# Patient Record
Sex: Female | Born: 1950
Health system: Southern US, Community
[De-identification: ages and names within clinical notes are randomized; demographics above are authoritative.]

## PROBLEM LIST (undated history)

## (undated) DIAGNOSIS — M3500 Sicca syndrome, unspecified: Secondary | ICD-10-CM

## (undated) DIAGNOSIS — E039 Hypothyroidism, unspecified: Secondary | ICD-10-CM

## (undated) DIAGNOSIS — M539 Dorsopathy, unspecified: Secondary | ICD-10-CM

## (undated) DIAGNOSIS — Z8619 Personal history of other infectious and parasitic diseases: Secondary | ICD-10-CM

## (undated) DIAGNOSIS — G4700 Insomnia, unspecified: Secondary | ICD-10-CM

## (undated) DIAGNOSIS — E079 Disorder of thyroid, unspecified: Secondary | ICD-10-CM

## (undated) DIAGNOSIS — C801 Malignant (primary) neoplasm, unspecified: Secondary | ICD-10-CM

## (undated) DIAGNOSIS — K219 Gastro-esophageal reflux disease without esophagitis: Secondary | ICD-10-CM

## (undated) DIAGNOSIS — G971 Other reaction to spinal and lumbar puncture: Secondary | ICD-10-CM

## (undated) DIAGNOSIS — Z860100 Personal history of colon polyps, unspecified: Secondary | ICD-10-CM

## (undated) DIAGNOSIS — F329 Major depressive disorder, single episode, unspecified: Secondary | ICD-10-CM

## (undated) DIAGNOSIS — F32A Depression, unspecified: Secondary | ICD-10-CM

## (undated) DIAGNOSIS — T7840XA Allergy, unspecified, initial encounter: Secondary | ICD-10-CM

## (undated) DIAGNOSIS — J45909 Unspecified asthma, uncomplicated: Secondary | ICD-10-CM

## (undated) DIAGNOSIS — D649 Anemia, unspecified: Secondary | ICD-10-CM

## (undated) DIAGNOSIS — N3010 Interstitial cystitis (chronic) without hematuria: Secondary | ICD-10-CM

## (undated) DIAGNOSIS — M1990 Unspecified osteoarthritis, unspecified site: Secondary | ICD-10-CM

## (undated) DIAGNOSIS — E785 Hyperlipidemia, unspecified: Secondary | ICD-10-CM

## (undated) HISTORY — DX: Major depressive disorder, single episode, unspecified: F32.9

## (undated) HISTORY — DX: Hypothyroidism, unspecified: E03.9

## (undated) HISTORY — DX: Hyperlipidemia, unspecified: E78.5

## (undated) HISTORY — DX: Depression, unspecified: F32.A

## (undated) HISTORY — DX: Disorder of thyroid, unspecified: E07.9

## (undated) HISTORY — PX: JOINT REPLACEMENT: SHX530

## (undated) HISTORY — PX: TONSILLECTOMY AND ADENOIDECTOMY: SHX28

## (undated) HISTORY — DX: Sjogren syndrome, unspecified: M35.00

## (undated) HISTORY — DX: Unspecified asthma, uncomplicated: J45.909

## (undated) HISTORY — DX: Dorsopathy, unspecified: M53.9

## (undated) HISTORY — PX: COLONOSCOPY: SHX174

## (undated) HISTORY — DX: Allergy, unspecified, initial encounter: T78.40XA

## (undated) HISTORY — DX: Malignant (primary) neoplasm, unspecified: C80.1

---

## 1997-10-28 ENCOUNTER — Other Ambulatory Visit: Admission: RE | Admit: 1997-10-28 | Discharge: 1997-10-28 | Payer: Self-pay | Admitting: *Deleted

## 1998-11-18 ENCOUNTER — Other Ambulatory Visit: Admission: RE | Admit: 1998-11-18 | Discharge: 1998-11-18 | Payer: Self-pay | Admitting: *Deleted

## 1999-12-08 ENCOUNTER — Encounter: Admission: RE | Admit: 1999-12-08 | Discharge: 1999-12-08 | Payer: Self-pay | Admitting: *Deleted

## 1999-12-08 ENCOUNTER — Encounter: Payer: Self-pay | Admitting: *Deleted

## 1999-12-15 ENCOUNTER — Other Ambulatory Visit: Admission: RE | Admit: 1999-12-15 | Discharge: 1999-12-15 | Payer: Self-pay | Admitting: *Deleted

## 2001-01-09 ENCOUNTER — Other Ambulatory Visit: Admission: RE | Admit: 2001-01-09 | Discharge: 2001-01-09 | Payer: Self-pay | Admitting: *Deleted

## 2001-02-01 ENCOUNTER — Other Ambulatory Visit: Admission: RE | Admit: 2001-02-01 | Discharge: 2001-02-01 | Payer: Self-pay | Admitting: *Deleted

## 2001-02-01 ENCOUNTER — Encounter (INDEPENDENT_AMBULATORY_CARE_PROVIDER_SITE_OTHER): Payer: Self-pay

## 2001-02-22 ENCOUNTER — Encounter: Payer: Self-pay | Admitting: *Deleted

## 2001-02-22 ENCOUNTER — Encounter: Admission: RE | Admit: 2001-02-22 | Discharge: 2001-02-22 | Payer: Self-pay | Admitting: *Deleted

## 2002-02-24 ENCOUNTER — Encounter: Payer: Self-pay | Admitting: *Deleted

## 2002-02-24 ENCOUNTER — Encounter: Admission: RE | Admit: 2002-02-24 | Discharge: 2002-02-24 | Payer: Self-pay | Admitting: *Deleted

## 2002-02-27 ENCOUNTER — Other Ambulatory Visit: Admission: RE | Admit: 2002-02-27 | Discharge: 2002-02-27 | Payer: Self-pay | Admitting: *Deleted

## 2002-12-11 ENCOUNTER — Emergency Department (HOSPITAL_COMMUNITY): Admission: EM | Admit: 2002-12-11 | Discharge: 2002-12-12 | Payer: Self-pay | Admitting: *Deleted

## 2002-12-11 ENCOUNTER — Encounter: Payer: Self-pay | Admitting: *Deleted

## 2002-12-11 ENCOUNTER — Encounter: Payer: Self-pay | Admitting: Emergency Medicine

## 2003-02-18 ENCOUNTER — Other Ambulatory Visit: Admission: RE | Admit: 2003-02-18 | Discharge: 2003-02-18 | Payer: Self-pay | Admitting: *Deleted

## 2003-04-24 ENCOUNTER — Encounter: Admission: RE | Admit: 2003-04-24 | Discharge: 2003-04-24 | Payer: Self-pay | Admitting: *Deleted

## 2004-04-29 ENCOUNTER — Other Ambulatory Visit: Admission: RE | Admit: 2004-04-29 | Discharge: 2004-04-29 | Payer: Self-pay | Admitting: *Deleted

## 2004-06-10 ENCOUNTER — Encounter: Admission: RE | Admit: 2004-06-10 | Discharge: 2004-06-10 | Payer: Self-pay | Admitting: *Deleted

## 2005-01-13 ENCOUNTER — Other Ambulatory Visit: Admission: RE | Admit: 2005-01-13 | Discharge: 2005-01-13 | Payer: Self-pay | Admitting: *Deleted

## 2005-01-16 ENCOUNTER — Ambulatory Visit (HOSPITAL_COMMUNITY): Admission: RE | Admit: 2005-01-16 | Discharge: 2005-01-16 | Payer: Self-pay | Admitting: Family Medicine

## 2005-04-10 DIAGNOSIS — C801 Malignant (primary) neoplasm, unspecified: Secondary | ICD-10-CM

## 2005-04-10 HISTORY — DX: Malignant (primary) neoplasm, unspecified: C80.1

## 2005-08-08 ENCOUNTER — Encounter: Admission: RE | Admit: 2005-08-08 | Discharge: 2005-08-08 | Payer: Self-pay | Admitting: Obstetrics & Gynecology

## 2005-09-14 ENCOUNTER — Encounter: Admission: RE | Admit: 2005-09-14 | Discharge: 2005-09-14 | Payer: Self-pay | Admitting: Orthopedic Surgery

## 2006-02-09 ENCOUNTER — Other Ambulatory Visit: Admission: RE | Admit: 2006-02-09 | Discharge: 2006-02-09 | Payer: Self-pay | Admitting: Obstetrics & Gynecology

## 2006-09-19 ENCOUNTER — Encounter: Admission: RE | Admit: 2006-09-19 | Discharge: 2006-09-19 | Payer: Self-pay | Admitting: Obstetrics & Gynecology

## 2006-09-25 ENCOUNTER — Encounter: Admission: RE | Admit: 2006-09-25 | Discharge: 2006-09-25 | Payer: Self-pay | Admitting: Obstetrics & Gynecology

## 2007-05-12 HISTORY — PX: CARPAL TUNNEL RELEASE: SHX101

## 2007-07-18 ENCOUNTER — Other Ambulatory Visit: Admission: RE | Admit: 2007-07-18 | Discharge: 2007-07-18 | Payer: Self-pay | Admitting: Obstetrics & Gynecology

## 2007-12-19 ENCOUNTER — Encounter: Admission: RE | Admit: 2007-12-19 | Discharge: 2007-12-19 | Payer: Self-pay | Admitting: Obstetrics & Gynecology

## 2008-07-22 ENCOUNTER — Other Ambulatory Visit: Admission: RE | Admit: 2008-07-22 | Discharge: 2008-07-22 | Payer: Self-pay | Admitting: Obstetrics & Gynecology

## 2009-01-04 ENCOUNTER — Encounter: Admission: RE | Admit: 2009-01-04 | Discharge: 2009-01-04 | Payer: Self-pay | Admitting: Obstetrics & Gynecology

## 2010-01-31 ENCOUNTER — Encounter: Admission: RE | Admit: 2010-01-31 | Discharge: 2010-01-31 | Payer: Self-pay | Admitting: Obstetrics & Gynecology

## 2010-05-01 ENCOUNTER — Encounter: Payer: Self-pay | Admitting: Obstetrics & Gynecology

## 2010-05-01 ENCOUNTER — Encounter: Payer: Self-pay | Admitting: Family Medicine

## 2010-05-02 ENCOUNTER — Encounter: Payer: Self-pay | Admitting: Obstetrics & Gynecology

## 2011-04-28 ENCOUNTER — Ambulatory Visit (INDEPENDENT_AMBULATORY_CARE_PROVIDER_SITE_OTHER): Payer: Self-pay

## 2011-04-28 DIAGNOSIS — J180 Bronchopneumonia, unspecified organism: Secondary | ICD-10-CM

## 2011-08-03 ENCOUNTER — Other Ambulatory Visit: Payer: Self-pay | Admitting: Obstetrics & Gynecology

## 2011-08-03 DIAGNOSIS — Z1231 Encounter for screening mammogram for malignant neoplasm of breast: Secondary | ICD-10-CM

## 2011-08-07 ENCOUNTER — Ambulatory Visit
Admission: RE | Admit: 2011-08-07 | Discharge: 2011-08-07 | Disposition: A | Discharge status: Home / Self Care | Payer: Self-pay | Source: Ambulatory Visit | Attending: Obstetrics & Gynecology | Admitting: Obstetrics & Gynecology

## 2011-08-07 DIAGNOSIS — Z1231 Encounter for screening mammogram for malignant neoplasm of breast: Secondary | ICD-10-CM

## 2011-12-07 ENCOUNTER — Ambulatory Visit: Payer: Self-pay | Admitting: Emergency Medicine

## 2011-12-07 VITALS — BP 142/88 | HR 79 | Temp 97.9°F | Resp 16 | Ht 63.25 in | Wt 145.0 lb

## 2011-12-07 DIAGNOSIS — J40 Bronchitis, not specified as acute or chronic: Secondary | ICD-10-CM

## 2011-12-07 DIAGNOSIS — J018 Other acute sinusitis: Secondary | ICD-10-CM

## 2011-12-07 MED ORDER — PSEUDOEPHEDRINE-GUAIFENESIN ER 60-600 MG PO TB12: 1.0000 | ORAL_TABLET | Freq: Two times a day (BID) | ORAL | Status: AC

## 2011-12-07 MED ORDER — FLUCONAZOLE 150 MG PO TABS: ORAL_TABLET | ORAL | Status: DC

## 2011-12-07 MED ORDER — HYDROCOD POLST-CHLORPHEN POLST 10-8 MG/5ML PO LQCR: 5.0000 mL | Freq: Two times a day (BID) | ORAL | Status: DC | PRN

## 2011-12-07 MED ORDER — AMOXICILLIN-POT CLAVULANATE 875-125 MG PO TABS: 1.0000 | ORAL_TABLET | Freq: Two times a day (BID) | ORAL | Status: AC

## 2011-12-07 NOTE — Addendum Note (Signed)
Addended by: Cydney Ok on: 12/07/2011 07:43 PM   Modules accepted: Orders

## 2011-12-07 NOTE — Progress Notes (Signed)
   Date:  12/07/2011   Name:  Christine Valdez   DOB:  06/01/1950   MRN:  [id number] Gender: female Age: 61 y.o.  PCP:  Tally Due, MD    Chief Complaint: URI   History of Present Illness:  Christine Valdez is a 61 y.o. pleasant patient who presents with the following:  Ill three weeks with headache and a "head cold".  Initially was clear nasal discharge and a non productive cough.  Now having clear nasal drainage and purulent post nasal drainage and a purulent productive cough.  Feels hot and cold but no measured fever.  Has headache and sinus pressure.  No wheezing or shortness of breath, nausea or vomiting.  Malaise and myalgias.  Little improvement with OTC meds.    There is no problem list on file for this patient.   No past medical history on file.  No past surgical history on file.  History  Substance Use Topics  . Smoking status: Never Smoker   . Smokeless tobacco: Not on file  . Alcohol Use: Not on file    No family history on file.  Allergies  Allergen Reactions  . Mercury Detox (Nutritional Supplements)   . Sulfa Antibiotics   . Thimerosal Hives  . Levaquin (Levofloxacin In D5w) Rash    Medication list has been reviewed and updated.  Current Outpatient Prescriptions on File Prior to Visit  Medication Sig Dispense Refill  . albuterol (PROVENTIL HFA;VENTOLIN HFA) 108 (90 BASE) MCG/ACT inhaler Inhale 2 puffs into the lungs every 6 (six) hours as needed.      . citalopram (CELEXA) 40 MG tablet Take 40 mg by mouth daily.      Marland Kitchen levothyroxine (SYNTHROID, LEVOTHROID) 75 MCG tablet Take 75 mcg by mouth daily.      Marland Kitchen loratadine (CLARITIN) 10 MG tablet Take 10 mg by mouth daily.      . potassium chloride SA (K-DUR,KLOR-CON) 20 MEQ tablet Take 20 mEq by mouth daily.      Marland Kitchen triamterene-hydrochlorothiazide (MAXZIDE) 75-50 MG per tablet Take 1 tablet by mouth daily.        Review of Systems:  As per HPI, otherwise negative.    Physical  Examination: Filed Vitals:   12/07/11 1854  BP: 142/88  Pulse: 79  Temp: 97.9 F (36.6 C)  Resp: 16   Filed Vitals:   12/07/11 1854  Height: 5' 3.25" (1.607 m)  Weight: 145 lb (65.772 kg)   Body mass index is 25.48 kg/(m^2). Ideal Body Weight: Weight in (lb) to have BMI = 25: 142   GEN: WDWN, NAD, Non-toxic, A & O x 3 HEENT: Atraumatic, Normocephalic. Neck supple. No masses, No LAD.  TM negative.  Posterior pharyngeal purulent drainage Ears and Nose: No external deformity. CV: RRR, No M/G/R. No JVD. No thrill. No extra heart sounds. PULM: CTA B, no wheezes, crackles, rhonchi. No retractions. No resp. distress. No accessory muscle use. ABD: S, NT, ND, +BS. No rebound. No HSM. EXTR: No c/c/e NEURO Normal gait.   Assessment and Plan: Sinusitis Bronchitis  augmentin mucinex d tussionex Follow up as needed  Carmelina Dane, MD

## 2012-03-18 ENCOUNTER — Ambulatory Visit: Payer: Self-pay | Admitting: Emergency Medicine

## 2012-03-18 VITALS — BP 118/82 | HR 85 | Temp 98.1°F | Resp 16 | Ht 63.5 in | Wt 147.6 lb

## 2012-03-18 DIAGNOSIS — J018 Other acute sinusitis: Secondary | ICD-10-CM

## 2012-03-18 MED ORDER — PSEUDOEPHEDRINE-GUAIFENESIN ER 60-600 MG PO TB12: 1.0000 | ORAL_TABLET | Freq: Two times a day (BID) | ORAL | Status: AC

## 2012-03-18 MED ORDER — FLUTICASONE PROPIONATE 50 MCG/ACT NA SUSP: NASAL | Status: DC

## 2012-03-18 MED ORDER — FLUCONAZOLE 150 MG PO TABS: 150.0000 mg | ORAL_TABLET | Freq: Once | ORAL | Status: DC

## 2012-03-18 MED ORDER — AMOXICILLIN-POT CLAVULANATE 875-125 MG PO TABS: 1.0000 | ORAL_TABLET | Freq: Two times a day (BID) | ORAL | Status: DC

## 2012-03-18 NOTE — Progress Notes (Signed)
[facility] [address], [place] [address] [phone]  Date:  03/18/2012   Name:  Christine Valdez   DOB:  January 20, 1951   MRN:  [id number]  PCP:  Tally Due, MD    Chief Complaint: Sinusitis, Sore Throat, Fatigue and Headache   History of Present Illness:  Christine Valdez is a 61 y.o. very pleasant female patient who presents with the following:  1 week history of nasal congestion and pressure in frontal and maxillary sinuses associated with sore throat.  Moderate post nasal drainage associated with nausea.  No fever or chills, vomiting.  Minimal non productive cough.  No wheezing or shortness of breath.  No stool change.  There is no problem list on file for this patient.   Past Medical History  Diagnosis Date  . Allergy   . Asthma   . Cancer   . Depression     No past surgical history on file.  History  Substance Use Topics  . Smoking status: Never Smoker   . Smokeless tobacco: Not on file  . Alcohol Use: Not on file    Family History  Problem Relation Age of Onset  . Cancer Sister   . Diabetes Brother   . Epilepsy Daughter     Allergies  Allergen Reactions  . Mercury Detox (Nutritional Supplements)   . Sulfa Antibiotics   . Thimerosal Hives  . Levaquin (Levofloxacin In D5w) Rash    Medication list has been reviewed and updated.  Current Outpatient Prescriptions on File Prior to Visit  Medication Sig Dispense Refill  . albuterol (PROVENTIL HFA;VENTOLIN HFA) 108 (90 BASE) MCG/ACT inhaler Inhale 2 puffs into the lungs every 6 (six) hours as needed.      . citalopram (CELEXA) 40 MG tablet Take 40 mg by mouth daily.      . fish oil-omega-3 fatty acids 1000 MG capsule Take 2 g by mouth daily.      Marland Kitchen levothyroxine (SYNTHROID, LEVOTHROID) 75 MCG tablet Take 75 mcg by mouth daily.      Marland Kitchen loratadine (CLARITIN) 10 MG tablet Take 10 mg by mouth daily.      . Multiple Vitamin (MULTIVITAMIN) tablet Take 1 tablet by mouth daily.       . potassium chloride SA (K-DUR,KLOR-CON) 20 MEQ tablet Take 20 mEq by mouth daily.      Marland Kitchen triamterene-hydrochlorothiazide (MAXZIDE) 75-50 MG per tablet Take 1 tablet by mouth daily.      . chlorpheniramine-HYDROcodone (TUSSIONEX PENNKINETIC ER) 10-8 MG/5ML LQCR Take 5 mLs by mouth every 12 (twelve) hours as needed (cough).  60 mL  0  . fluconazole (DIFLUCAN) 150 MG tablet Take one tablet now, take another tablet in 7 days if symptoms still persist  2 tablet  0  . pseudoephedrine-guaifenesin (MUCINEX D) 60-600 MG per tablet Take 1 tablet by mouth every 12 (twelve) hours.  18 tablet  0    Review of Systems:  As per HPI, otherwise negative.    Physical Examination: Filed Vitals:   03/18/12 1218  BP: 118/82  Pulse: 85  Temp: 98.1 F (36.7 C)  Resp: 16   Filed Vitals:   03/18/12 1218  Height: 5' 3.5" (1.613 m)  Weight: 147 lb 9.6 oz (66.951 kg)   Body mass index is 25.74 kg/(m^2). Ideal Body Weight: Weight in (lb) to have BMI = 25: 143.1   GEN: WDWN, NAD, Non-toxic, A & O x 3  No rash or shortness of breath  HEENT: Atraumatic, Normocephalic. Neck supple. No masses, No LAD.  Oropharynx negative Ears and Nose: No external deformity.  TM negative  Green nasal drainage and tenderness over sinuses CV: RRR, No M/G/R. No JVD. No thrill. No extra heart sounds. PULM: CTA B, no wheezes, crackles, rhonchi. No retractions. No resp. distress. No accessory muscle use. ABD: S, NT, ND, +BS. No rebound. No HSM. EXTR: No c/c/e NEURO Normal gait.  PSYCH: Normally interactive. Conversant. Not depressed or anxious appearing.  Calm demeanor.    Assessment and Plan: Sinusitis augmentin mucinex d  Carmelina Dane, MD

## 2012-03-18 NOTE — Addendum Note (Signed)
Addended by: Carmelina Dane on: 03/18/2012 01:13 PM   Modules accepted: Orders

## 2012-04-15 LAB — HM PAP SMEAR: HM Pap smear: NEGATIVE

## 2012-07-19 ENCOUNTER — Other Ambulatory Visit: Payer: Self-pay

## 2012-07-19 DIAGNOSIS — Z1231 Encounter for screening mammogram for malignant neoplasm of breast: Secondary | ICD-10-CM

## 2012-08-14 ENCOUNTER — Other Ambulatory Visit: Payer: Self-pay | Admitting: Obstetrics & Gynecology

## 2012-08-14 NOTE — Telephone Encounter (Signed)
eScribe request for refill on TEMAZEPAM Last filled - 12/05/11, #30 X 5 Last AEX - 04/15/12 Next AEX - Not scheduled. Please advise refills.  Chart in your door.  Thanks.

## 2012-08-16 ENCOUNTER — Ambulatory Visit
Admission: RE | Admit: 2012-08-16 | Discharge: 2012-08-16 | Disposition: A | Discharge status: Home / Self Care | Payer: BC Managed Care – PPO | Source: Ambulatory Visit

## 2012-08-16 DIAGNOSIS — Z1231 Encounter for screening mammogram for malignant neoplasm of breast: Secondary | ICD-10-CM

## 2013-03-18 ENCOUNTER — Encounter: Payer: Self-pay | Admitting: Nurse Practitioner

## 2013-03-19 ENCOUNTER — Encounter: Payer: Self-pay | Admitting: Nurse Practitioner

## 2013-03-19 ENCOUNTER — Ambulatory Visit (INDEPENDENT_AMBULATORY_CARE_PROVIDER_SITE_OTHER): Payer: BC Managed Care – PPO | Admitting: Nurse Practitioner

## 2013-03-19 VITALS — BP 130/84 | HR 84 | Resp 16 | Ht 63.5 in | Wt 154.0 lb

## 2013-03-19 DIAGNOSIS — N951 Menopausal and female climacteric states: Secondary | ICD-10-CM

## 2013-03-19 DIAGNOSIS — Z01419 Encounter for gynecological examination (general) (routine) without abnormal findings: Secondary | ICD-10-CM

## 2013-03-19 DIAGNOSIS — Z780 Asymptomatic menopausal state: Secondary | ICD-10-CM

## 2013-03-19 DIAGNOSIS — Z0000 Encounter for general adult medical examination without abnormal findings: Secondary | ICD-10-CM

## 2013-03-19 LAB — POCT URINALYSIS DIPSTICK
Bilirubin, UA: NEGATIVE
Blood, UA: NEGATIVE
Glucose, UA: NEGATIVE
Ketones, UA: NEGATIVE
Leukocytes, UA: NEGATIVE
Nitrite, UA: NEGATIVE
Protein, UA: NEGATIVE
Urobilinogen, UA: NEGATIVE
pH, UA: 8

## 2013-03-19 NOTE — Progress Notes (Signed)
Patient ID: Christine Valdez, female   DOB: June 11, 1950, 62 y.o.   MRN: [id number] 62 y.o. G12P0102 Married Caucasian Fe here for annual exam.    No LMP recorded. Patient is postmenopausal.          Sexually active: no  The current method of family planning is abstinence.    Exercising: yes  occasional walking Smoker:  no  Health Maintenance: Pap: 04/15/12, WNL, neg HR HPV MMG: 08/20/12, Bi-Rads 1: negative Colonoscopy: 05/2012, Dr. Noe Gens, polyps pre cancer, repeat in 2 years BMD: 12/2007 normal TDaP: UTD Labs: HB: PCP Urine: Negative, pH 8.0   reports that she has never smoked. She has never used smokeless tobacco. She reports that she drinks alcohol. She reports that she does not use illicit drugs.  Past Medical History  Diagnosis Date  . Allergy   . Asthma   . Depression   . Hypertension   . Hypothyroid   . Sjogren's syndrome   . 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 Cesarean section      x 2  . Tonsillectomy and adenoidectomy  age 36  . Carpal tunnel release Right 05/2007    Current Outpatient Prescriptions  Medication Sig Dispense Refill  . albuterol (PROVENTIL HFA;VENTOLIN HFA) 108 (90 BASE) MCG/ACT inhaler Inhale 2 puffs into the lungs every 6 (six) hours as needed.      . citalopram (CELEXA) 40 MG tablet Take 40 mg by mouth daily.      Marland Kitchen docusate sodium (COLACE) 100 MG capsule Take 400 mg by mouth daily.      . DULoxetine (CYMBALTA) 30 MG capsule Take 30 mg by mouth daily.      . fish oil-omega-3 fatty acids 1000 MG capsule Take 2 g by mouth daily.      . fluticasone (FLONASE) 50 MCG/ACT nasal spray 2 squirts each nostril daily  16 g  12  . levothyroxine (SYNTHROID, LEVOTHROID) 75 MCG tablet Take 75 mcg by mouth daily.      Marland Kitchen loratadine (CLARITIN) 10 MG tablet Take 10 mg by mouth daily.      . Multiple Vitamin (MULTIVITAMIN) tablet Take 1 tablet by mouth daily.      . potassium chloride SA (K-DUR,KLOR-CON) 20  MEQ tablet Take 20 mEq by mouth daily.      . pregabalin (LYRICA) 50 MG capsule Take 50 mg by mouth 2 (two) times daily.      . temazepam (RESTORIL) 30 MG capsule Take 1 capsule (30 mg total) by mouth at bedtime as needed for sleep.  30 capsule  1  . triamterene-hydrochlorothiazide (MAXZIDE) 75-50 MG per tablet Take 1 tablet by mouth daily.       No current facility-administered medications for this visit.    Family History  Problem Relation Age of Onset  . Breast cancer Sister 36    stage 4, chemo and radiation  . Diabetes Brother   . Epilepsy Daughter   . Hypertension Mother   . Osteoporosis Mother   . Diabetes Father   . Hypertension Father     ROS:  Pertinent items are noted in HPI.  Otherwise, a comprehensive ROS was negative.  Exam:   BP 130/84  Pulse 84  Resp 16  Ht 5' 3.5" (1.613 m)  Wt 154 lb (69.854 kg)  BMI 26.85 kg/m2 Height: 5' 3.5" (161.3 cm)  Ht Readings from Last 3 Encounters:  03/19/13 5' 3.5" (  1.613 m)  03/18/12 5' 3.5" (1.613 m)  12/07/11 5' 3.25" (1.607 m)    General appearance: alert, cooperative and appears stated age Head: Normocephalic, without obvious abnormality, atraumatic Neck: no adenopathy, supple, symmetrical, trachea midline and thyroid normal to inspection and palpation Lungs: clear to auscultation bilaterally Breasts: normal appearance, no masses or tenderness Heart: regular rate and rhythm Abdomen: soft, non-tender; no masses,  no organomegaly Extremities: extremities normal, atraumatic, no cyanosis or edema Skin: Skin color, texture, turgor normal. No rashes or lesions Lymph nodes: Cervical, supraclavicular, and axillary nodes normal. No abnormal inguinal nodes palpated Neurologic: Grossly normal   Pelvic: External genitalia:  no lesions              Urethra:  normal appearing urethra with no masses, tenderness or lesions              Bartholin's and Skene's: normal                 Vagina: atrophic appearing vagina with pale  color and discharge, no lesions              Cervix: anteverted              Pap taken: no Bimanual Exam:  Uterus:  normal size, contour, position, consistency, mobility, non-tender              Adnexa: no mass, fullness, tenderness               Rectovaginal: Confirms               Anus:  normal sphincter tone, no lesions  A:  Well Woman with normal exam  History of HTN, hypothyroid  History of hypercalcemia  History of Sjogren's syndrome  History of depression and now chronic pain in cervical neck  FMH: sister with breast cancer    P:   Pap smear as per guidelines   Mammogram due 5/15  Information about BRCA testing  At length discussed that patient has multiple medical issues with chronic pain - we would prefer that her PCP manage all her med's.  We have given her an antidepressant years ago while going through menopausal but now Cymbalta and Lyrica are added.  Declined any refill of her med's as we will defer all medication therapy to PCP.  She was very comfortable with the plan.  Counseled on breast self exam, mammography screening, osteoporosis, adequate intake of calcium and vitamin D, diet and exercise return annually or prn  An After Visit Summary was printed and given to the patient.

## 2013-03-19 NOTE — Patient Instructions (Addendum)

## 2013-03-21 NOTE — Progress Notes (Signed)
Encounter reviewed by Dr. Brook Silva.  

## 2013-04-25 ENCOUNTER — Ambulatory Visit: Payer: Self-pay | Admitting: Obstetrics & Gynecology

## 2013-06-25 ENCOUNTER — Encounter (HOSPITAL_COMMUNITY): Payer: Self-pay | Admitting: Pharmacy Technician

## 2013-06-26 NOTE — H&P (Signed)
Christine Valdez is an 63 y.o. female.    Chief Complaint: right shoulder pain  HPI: Pt is a 63 y.o. female complaining of right shoulder pain for multiple years. Pain had continually increased since the beginning. X-rays in the clinic show end-stage arthritic changes of the right shoulder. Pt has tried various conservative treatments which have failed to alleviate their symptoms, including injections and therapy. Various options are discussed with the patient. Risks, benefits and expectations were discussed with the patient. Patient understand the risks, benefits and expectations and wishes to proceed with surgery.   PCP:  Hayden Rasmussen., MD  D/C Plans:  Home with HHPT  PMH: Past Medical History  Diagnosis Date  . Allergy   . Asthma   . Depression   . Hypertension   . Hypothyroid   . Sjogren's syndrome   . 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 Cesarean section      x 2  . Tonsillectomy and adenoidectomy  age 21  . Carpal tunnel release Right 05/2007    Social History:  reports that she has never smoked. She has never used smokeless tobacco. She reports that she drinks alcohol. She reports that she does not use illicit drugs.  Allergies:  Allergies  Allergen Reactions  . Thimerosal Hives  . Mercury Detox [Nutritional Supplements] Hives  . Levaquin [Levofloxacin] Rash  . Sulfa Antibiotics Rash    Medications: No current facility-administered medications for this encounter.   Current Outpatient Prescriptions  Medication Sig Dispense Refill  . albuterol (PROVENTIL HFA;VENTOLIN HFA) 108 (90 BASE) MCG/ACT inhaler Inhale 2 puffs into the lungs every 6 (six) hours as needed for shortness of breath.       . citalopram (CELEXA) 40 MG tablet Take 40 mg by mouth daily.      Marland Kitchen docusate sodium (COLACE) 100 MG capsule Take 400 mg by mouth at bedtime.       . fluticasone (FLONASE) 50 MCG/ACT nasal spray Place 2  sprays into both nostrils daily as needed for allergies.      . Ketotifen Fumarate (ALAWAY OP) Place 1 drop into both eyes daily as needed (for allergies).      Marland Kitchen levothyroxine (SYNTHROID, LEVOTHROID) 75 MCG tablet Take 75 mcg by mouth daily.      Marland Kitchen loratadine (CLARITIN) 10 MG tablet Take 10 mg by mouth daily as needed for allergies.       Marland Kitchen oxyCODONE-acetaminophen (PERCOCET) 10-325 MG per tablet Take 1 tablet by mouth 3 (three) times daily as needed for pain.      . potassium chloride SA (K-DUR,KLOR-CON) 20 MEQ tablet Take 20 mEq by mouth daily.      . temazepam (RESTORIL) 30 MG capsule Take 1 capsule (30 mg total) by mouth at bedtime as needed for sleep.  30 capsule  1  . triamterene-hydrochlorothiazide (MAXZIDE) 75-50 MG per tablet Take 0.5 tablets by mouth daily.       . cholecalciferol (VITAMIN D) 1000 UNITS tablet Take 1,000 Units by mouth daily.      . Multiple Vitamin (MULTIVITAMIN) tablet Take 1 tablet by mouth daily.        No results found for this or any previous visit (from the past 48 hour(s)). No results found.  ROS: Pain with rom of the right upper extremity  Physical Exam: Alert and oriented 63 y.o. female in no acute distress Cranial nerves 2-12 intact Cervical spine:  full rom with no tenderness, nv intact distally Chest: active breath sounds bilaterally, no wheeze rhonchi or rales Heart: regular rate and rhythm, no murmur Abd: non tender non distended with active bowel sounds Hip is stable with rom  Right shoulder with mild decrease with rom Mild crepitus with rom nv intact distally No rashes or edema Adequate strength of ER and IR 4.5/5  Assessment/Plan Assessment: right shoulder end stage osteoarthritis   Plan: Patient will undergo a right total shoulder arthroplasty by Dr. Veverly Fells at [facility]. Risks benefits and expectations were discussed with the patient. Patient understand risks, benefits and expectations and wishes to proceed.

## 2013-06-30 ENCOUNTER — Encounter (HOSPITAL_COMMUNITY): Payer: Self-pay

## 2013-06-30 ENCOUNTER — Encounter (HOSPITAL_COMMUNITY)
Admission: RE | Admit: 2013-06-30 | Discharge: 2013-06-30 | Disposition: A | Discharge status: Home / Self Care | Payer: BC Managed Care – PPO | Source: Ambulatory Visit | Attending: Orthopedic Surgery | Admitting: Orthopedic Surgery

## 2013-06-30 ENCOUNTER — Ambulatory Visit (HOSPITAL_COMMUNITY)
Admission: RE | Admit: 2013-06-30 | Discharge: 2013-06-30 | Disposition: A | Discharge status: Home / Self Care | Payer: BC Managed Care – PPO | Source: Ambulatory Visit | Attending: Anesthesiology | Admitting: Anesthesiology

## 2013-06-30 DIAGNOSIS — I10 Essential (primary) hypertension: Secondary | ICD-10-CM | POA: Insufficient documentation

## 2013-06-30 DIAGNOSIS — Z01818 Encounter for other preprocedural examination: Secondary | ICD-10-CM | POA: Insufficient documentation

## 2013-06-30 HISTORY — DX: Unspecified osteoarthritis, unspecified site: M19.90

## 2013-06-30 HISTORY — DX: Personal history of other infectious and parasitic diseases: Z86.19

## 2013-06-30 HISTORY — DX: Personal history of colon polyps, unspecified: Z86.0100

## 2013-06-30 HISTORY — DX: Anemia, unspecified: D64.9

## 2013-06-30 HISTORY — DX: Personal history of colonic polyps: Z86.010

## 2013-06-30 HISTORY — DX: Other reaction to spinal and lumbar puncture: G97.1

## 2013-06-30 HISTORY — DX: Interstitial cystitis (chronic) without hematuria: N30.10

## 2013-06-30 HISTORY — DX: Insomnia, unspecified: G47.00

## 2013-06-30 LAB — TYPE AND SCREEN
ABO/RH(D): A NEG
Antibody Screen: NEGATIVE

## 2013-06-30 LAB — ABO/RH: ABO/RH(D): A NEG

## 2013-06-30 LAB — APTT: aPTT: 27 seconds (ref 24–37)

## 2013-06-30 LAB — PROTIME-INR
INR: 0.91 (ref 0.00–1.49)
Prothrombin Time: 12.1 seconds (ref 11.6–15.2)

## 2013-06-30 MED ORDER — CHLORHEXIDINE GLUCONATE 4 % EX LIQD: 60.0000 mL | Freq: Once | CUTANEOUS | Status: DC

## 2013-06-30 NOTE — Progress Notes (Signed)
Pt doesn't have a cardiologist  Denies ever having an echo/stress test/heart cath  EKG in chart from [facility]  Denies CXR in past yr  Dr.Richter is Medical Md

## 2013-06-30 NOTE — Pre-Procedure Instructions (Signed)
JONATHAN KIRKENDOLL  06/30/2013   Your procedure is scheduled on:  Fri, Mar 27 @ 10:30 AM  Report to [facility]  at 8:30 AM.  Call this number if you have problems the morning of surgery: [phone]   Remember:   Do not eat food or drink liquids after midnight.   Take these medicines the morning of surgery with A SIP OF WATER: Albuterol<Bring Your Inhaler With You>,Celexa(Citalopram),Flonase(Fluticasone-if needed),Synthroid(Levothyroxine),Claritin(Loratadine-if needed),and Pain Pill(if needed)               No Goody's,BC's,Aleve,Ibuprofen,Fish Oil,or any Herbal Medications   Do not wear jewelry, make-up or nail polish.  Do not wear lotions, powders, or perfumes. You may wear deodorant.  Do not shave 48 hours prior to surgery.   Do not bring valuables to the hospital.  [facility] is not responsible                  for any belongings or valuables.               Contacts, dentures or bridgework may not be worn into surgery.  Leave suitcase in the car. After surgery it may be brought to your room.  For patients admitted to the hospital, discharge time is determined by your                treatment team.                 Special Instructions:  [facility] - Preparing for Surgery  Before surgery, you can play an important role.  Because skin is not sterile, your skin needs to be as free of germs as possible.  You can reduce the number of germs on you skin by washing with CHG (chlorahexidine gluconate) soap before surgery.  CHG is an antiseptic cleaner which kills germs and bonds with the skin to continue killing germs even after washing.  Please DO NOT use if you have an allergy to CHG or antibacterial soaps.  If your skin becomes reddened/irritated stop using the CHG and inform your nurse when you arrive at Short Stay.  Do not shave (including legs and underarms) for at least 48 hours prior to the first CHG shower.  You may shave your face.  Please follow these instructions  carefully:   1.  Shower with CHG Soap the night before surgery and the                                morning of Surgery.  2.  If you choose to wash your hair, wash your hair first as usual with your       normal shampoo.  3.  After you shampoo, rinse your hair and body thoroughly to remove the                      Shampoo.  4.  Use CHG as you would any other liquid soap.  You can apply chg directly       to the skin and wash gently with scrungie or a clean washcloth.  5.  Apply the CHG Soap to your body ONLY FROM THE NECK DOWN.        Do not use on open wounds or open sores.  Avoid contact with your eyes,       ears, mouth and genitals (private parts).  Wash genitals (private parts)  with your normal soap.  6.  Wash thoroughly, paying special attention to the area where your surgery        will be performed.  7.  Thoroughly rinse your body with warm water from the neck down.  8.  DO NOT shower/wash with your normal soap after using and rinsing off       the CHG Soap.  9.  Pat yourself dry with a clean towel.            10.  Wear clean pajamas.            11.  Place clean sheets on your bed the night of your first shower and do not        sleep with pets.  Day of Surgery  Do not apply any lotions/deoderants the morning of surgery.  Please wear clean clothes to the hospital/surgery center.     Please read over the following fact sheets that you were given: Pain Booklet, Coughing and Deep Breathing, Blood Transfusion Information and Surgical Site Infection Prevention

## 2013-07-02 NOTE — Progress Notes (Signed)
No labs at Dr Daine Floras office (and they are with [facility]) so CBC and Bmet will need to be drawn DOS

## 2013-07-03 MED ORDER — CEFAZOLIN SODIUM-DEXTROSE 2-3 GM-% IV SOLR
2.0000 g | INTRAVENOUS | Status: AC
  Administered 2013-07-04: 2 g via INTRAVENOUS
  Filled 2013-07-03: qty 50

## 2013-07-04 ENCOUNTER — Inpatient Hospital Stay (HOSPITAL_COMMUNITY)
Admission: RE | Admit: 2013-07-04 | Discharge: 2013-07-07 | DRG: 483 | Disposition: A | Discharge status: Home / Self Care | Payer: BC Managed Care – PPO | Source: Ambulatory Visit | Attending: Orthopedic Surgery | Admitting: Orthopedic Surgery

## 2013-07-04 ENCOUNTER — Encounter (HOSPITAL_COMMUNITY)
Admission: RE | Disposition: A | Discharge status: Home / Self Care | Payer: Self-pay | Source: Ambulatory Visit | Attending: Orthopedic Surgery

## 2013-07-04 ENCOUNTER — Encounter (HOSPITAL_COMMUNITY): Payer: Self-pay | Admitting: *Deleted

## 2013-07-04 ENCOUNTER — Encounter (HOSPITAL_COMMUNITY): Payer: BC Managed Care – PPO | Admitting: Vascular Surgery

## 2013-07-04 ENCOUNTER — Inpatient Hospital Stay (HOSPITAL_COMMUNITY): Payer: BC Managed Care – PPO | Admitting: Anesthesiology

## 2013-07-04 ENCOUNTER — Inpatient Hospital Stay (HOSPITAL_COMMUNITY): Payer: BC Managed Care – PPO

## 2013-07-04 DIAGNOSIS — Z860100 Personal history of colon polyps, unspecified: Secondary | ICD-10-CM

## 2013-07-04 DIAGNOSIS — G4700 Insomnia, unspecified: Secondary | ICD-10-CM | POA: Diagnosis present

## 2013-07-04 DIAGNOSIS — Z882 Allergy status to sulfonamides status: Secondary | ICD-10-CM

## 2013-07-04 DIAGNOSIS — E039 Hypothyroidism, unspecified: Secondary | ICD-10-CM | POA: Diagnosis present

## 2013-07-04 DIAGNOSIS — I10 Essential (primary) hypertension: Secondary | ICD-10-CM | POA: Diagnosis present

## 2013-07-04 DIAGNOSIS — M19019 Primary osteoarthritis, unspecified shoulder: Principal | ICD-10-CM | POA: Diagnosis present

## 2013-07-04 DIAGNOSIS — F3289 Other specified depressive episodes: Secondary | ICD-10-CM | POA: Diagnosis present

## 2013-07-04 DIAGNOSIS — F329 Major depressive disorder, single episode, unspecified: Secondary | ICD-10-CM | POA: Diagnosis present

## 2013-07-04 DIAGNOSIS — Z79899 Other long term (current) drug therapy: Secondary | ICD-10-CM

## 2013-07-04 DIAGNOSIS — Z888 Allergy status to other drugs, medicaments and biological substances status: Secondary | ICD-10-CM

## 2013-07-04 DIAGNOSIS — J45909 Unspecified asthma, uncomplicated: Secondary | ICD-10-CM | POA: Diagnosis present

## 2013-07-04 HISTORY — PX: TOTAL SHOULDER ARTHROPLASTY: SHX126

## 2013-07-04 SURGERY — ARTHROPLASTY, SHOULDER, TOTAL
Anesthesia: Regional | Site: Shoulder | Laterality: Right

## 2013-07-04 MED ORDER — PHENOL 1.4 % MT LIQD: 1.0000 | OROMUCOSAL | Status: DC | PRN

## 2013-07-04 MED ORDER — THROMBIN 5000 UNITS EX SOLR
CUTANEOUS | Status: DC | PRN
  Administered 2013-07-04: 5000 [IU] via TOPICAL

## 2013-07-04 MED ORDER — HYDROMORPHONE HCL PF 1 MG/ML IJ SOLN
1.0000 mg | INTRAMUSCULAR | Status: DC | PRN
Start: 2013-07-04 — End: 2013-07-07
  Administered 2013-07-04 – 2013-07-05 (×2): 2 mg via INTRAVENOUS
  Administered 2013-07-05 (×2): 1 mg via INTRAVENOUS
  Administered 2013-07-05 (×2): 2 mg via INTRAVENOUS
  Administered 2013-07-06 (×3): 1 mg via INTRAVENOUS
  Filled 2013-07-04: qty 1
  Filled 2013-07-04: qty 2
  Filled 2013-07-04: qty 1
  Filled 2013-07-04: qty 2
  Filled 2013-07-04: qty 1
  Filled 2013-07-04: qty 2
  Filled 2013-07-04: qty 1
  Filled 2013-07-04: qty 2
  Filled 2013-07-04: qty 1

## 2013-07-04 MED ORDER — ADULT MULTIVITAMIN W/MINERALS CH
1.0000 | ORAL_TABLET | Freq: Every day | ORAL | Status: DC
  Administered 2013-07-06 – 2013-07-07 (×2): 1 via ORAL
  Filled 2013-07-04 (×4): qty 1

## 2013-07-04 MED ORDER — PROPOFOL 10 MG/ML IV BOLUS
INTRAVENOUS | Status: DC | PRN
  Administered 2013-07-04: 150 mg via INTRAVENOUS

## 2013-07-04 MED ORDER — KETOTIFEN FUMARATE 0.025 % OP SOLN
1.0000 [drp] | Freq: Two times a day (BID) | OPHTHALMIC | Status: DC
  Administered 2013-07-04 – 2013-07-06 (×4): 1 [drp] via OPHTHALMIC
  Filled 2013-07-04: qty 5

## 2013-07-04 MED ORDER — METOCLOPRAMIDE HCL 10 MG PO TABS: 5.0000 mg | ORAL_TABLET | Freq: Three times a day (TID) | ORAL | Status: DC | PRN

## 2013-07-04 MED ORDER — LEVOTHYROXINE SODIUM 75 MCG PO TABS
75.0000 ug | ORAL_TABLET | Freq: Every day | ORAL | Status: DC
  Administered 2013-07-05 – 2013-07-07 (×3): 75 ug via ORAL
  Filled 2013-07-04 (×4): qty 1

## 2013-07-04 MED ORDER — TRIAMTERENE-HCTZ 37.5-25 MG PO TABS
1.0000 | ORAL_TABLET | Freq: Every day | ORAL | Status: DC
  Administered 2013-07-04 – 2013-07-07 (×3): 1 via ORAL
  Filled 2013-07-04 (×4): qty 1

## 2013-07-04 MED ORDER — HYDROMORPHONE HCL 2 MG PO TABS: 2.0000 mg | ORAL_TABLET | ORAL | Status: DC | PRN

## 2013-07-04 MED ORDER — LACTATED RINGERS IV SOLN
INTRAVENOUS | Status: DC
  Administered 2013-07-04: 09:00:00 via INTRAVENOUS

## 2013-07-04 MED ORDER — BISACODYL 10 MG RE SUPP: 10.0000 mg | Freq: Every day | RECTAL | Status: DC | PRN

## 2013-07-04 MED ORDER — ROCURONIUM BROMIDE 50 MG/5ML IV SOLN
INTRAVENOUS | Status: AC
Start: 2013-07-04 — End: 2013-07-04
  Filled 2013-07-04: qty 1

## 2013-07-04 MED ORDER — METHOCARBAMOL 500 MG PO TABS: 500.0000 mg | ORAL_TABLET | Freq: Three times a day (TID) | ORAL | Status: DC | PRN

## 2013-07-04 MED ORDER — ROCURONIUM BROMIDE 100 MG/10ML IV SOLN
INTRAVENOUS | Status: DC | PRN
  Administered 2013-07-04: 50 mg via INTRAVENOUS

## 2013-07-04 MED ORDER — DEXAMETHASONE SODIUM PHOSPHATE 4 MG/ML IJ SOLN
INTRAMUSCULAR | Status: DC | PRN
  Administered 2013-07-04: 8 mg via INTRAVENOUS

## 2013-07-04 MED ORDER — ACETAMINOPHEN 325 MG PO TABS: 650.0000 mg | ORAL_TABLET | Freq: Four times a day (QID) | ORAL | Status: DC | PRN

## 2013-07-04 MED ORDER — FENTANYL CITRATE 0.05 MG/ML IJ SOLN
50.0000 ug | INTRAMUSCULAR | Status: DC | PRN
  Administered 2013-07-04: 100 ug via INTRAVENOUS

## 2013-07-04 MED ORDER — OXYCODONE HCL 5 MG PO TABS: 5.0000 mg | ORAL_TABLET | Freq: Once | ORAL | Status: DC | PRN

## 2013-07-04 MED ORDER — METHOCARBAMOL 100 MG/ML IJ SOLN
500.0000 mg | Freq: Four times a day (QID) | INTRAVENOUS | Status: DC | PRN
  Filled 2013-07-04: qty 5

## 2013-07-04 MED ORDER — FENTANYL CITRATE 0.05 MG/ML IJ SOLN
INTRAMUSCULAR | Status: AC
Start: 2013-07-04 — End: 2013-07-04
  Filled 2013-07-04: qty 5

## 2013-07-04 MED ORDER — CITALOPRAM HYDROBROMIDE 40 MG PO TABS
40.0000 mg | ORAL_TABLET | Freq: Every day | ORAL | Status: DC
  Administered 2013-07-04 – 2013-07-06 (×3): 40 mg via ORAL
  Filled 2013-07-04 (×4): qty 1

## 2013-07-04 MED ORDER — BUPIVACAINE-EPINEPHRINE PF 0.5-1:200000 % IJ SOLN
INTRAMUSCULAR | Status: DC | PRN
  Administered 2013-07-04: 30 mL via PERINEURAL

## 2013-07-04 MED ORDER — SODIUM CHLORIDE 0.9 % IV SOLN
10.0000 mg | INTRAVENOUS | Status: DC | PRN
  Administered 2013-07-04: 10 ug/min via INTRAVENOUS

## 2013-07-04 MED ORDER — OXYCODONE-ACETAMINOPHEN 10-325 MG PO TABS: 1.0000 | ORAL_TABLET | ORAL | Status: DC | PRN

## 2013-07-04 MED ORDER — FENTANYL CITRATE 0.05 MG/ML IJ SOLN
INTRAMUSCULAR | Status: AC
  Administered 2013-07-04: 100 ug via INTRAVENOUS
  Filled 2013-07-04: qty 2

## 2013-07-04 MED ORDER — METOCLOPRAMIDE HCL 5 MG/ML IJ SOLN: 5.0000 mg | Freq: Three times a day (TID) | INTRAMUSCULAR | Status: DC | PRN

## 2013-07-04 MED ORDER — THROMBIN 5000 UNITS EX SOLR
CUTANEOUS | Status: AC
  Filled 2013-07-04: qty 5000

## 2013-07-04 MED ORDER — OXYCODONE-ACETAMINOPHEN 5-325 MG PO TABS
1.0000 | ORAL_TABLET | ORAL | Status: DC | PRN
  Administered 2013-07-05 – 2013-07-07 (×8): 1 via ORAL
  Filled 2013-07-04 (×9): qty 1

## 2013-07-04 MED ORDER — SODIUM CHLORIDE 0.9 % IV SOLN
INTRAVENOUS | Status: DC
  Administered 2013-07-05: 09:00:00 via INTRAVENOUS

## 2013-07-04 MED ORDER — POTASSIUM CHLORIDE CRYS ER 20 MEQ PO TBCR
20.0000 meq | EXTENDED_RELEASE_TABLET | Freq: Every day | ORAL | Status: DC
  Administered 2013-07-04 – 2013-07-06 (×2): 20 meq via ORAL
  Filled 2013-07-04 (×4): qty 1

## 2013-07-04 MED ORDER — GLYCOPYRROLATE 0.2 MG/ML IJ SOLN
INTRAMUSCULAR | Status: DC | PRN
  Administered 2013-07-04: 0.4 mg via INTRAVENOUS

## 2013-07-04 MED ORDER — MIDAZOLAM HCL 2 MG/2ML IJ SOLN
1.0000 mg | INTRAMUSCULAR | Status: DC | PRN
Start: 2013-07-04 — End: 2013-07-04
  Administered 2013-07-04: 2 mg via INTRAVENOUS

## 2013-07-04 MED ORDER — NEOSTIGMINE METHYLSULFATE 1 MG/ML IJ SOLN
INTRAMUSCULAR | Status: DC | PRN
  Administered 2013-07-04: 3 mg via INTRAVENOUS

## 2013-07-04 MED ORDER — OXYCODONE HCL 5 MG/5ML PO SOLN: 5.0000 mg | Freq: Once | ORAL | Status: DC | PRN

## 2013-07-04 MED ORDER — MENTHOL 3 MG MT LOZG
1.0000 | LOZENGE | OROMUCOSAL | Status: DC | PRN
  Administered 2013-07-05: 3 mg via ORAL
  Filled 2013-07-04: qty 9

## 2013-07-04 MED ORDER — OXYCODONE HCL 5 MG PO TABS
5.0000 mg | ORAL_TABLET | ORAL | Status: DC | PRN
  Administered 2013-07-05 – 2013-07-07 (×8): 5 mg via ORAL
  Filled 2013-07-04 (×9): qty 1

## 2013-07-04 MED ORDER — FLUTICASONE PROPIONATE 50 MCG/ACT NA SUSP
2.0000 | Freq: Every day | NASAL | Status: DC | PRN
  Filled 2013-07-04: qty 16

## 2013-07-04 MED ORDER — BUPIVACAINE-EPINEPHRINE (PF) 0.25% -1:200000 IJ SOLN
INTRAMUSCULAR | Status: AC
  Filled 2013-07-04: qty 30

## 2013-07-04 MED ORDER — DOCUSATE SODIUM 100 MG PO CAPS
400.0000 mg | ORAL_CAPSULE | Freq: Every day | ORAL | Status: DC
  Administered 2013-07-05 – 2013-07-06 (×2): 400 mg via ORAL
  Filled 2013-07-04 (×4): qty 4

## 2013-07-04 MED ORDER — PROPOFOL 10 MG/ML IV BOLUS
INTRAVENOUS | Status: AC
  Filled 2013-07-04: qty 20

## 2013-07-04 MED ORDER — ALBUTEROL SULFATE (2.5 MG/3ML) 0.083% IN NEBU
3.0000 mL | INHALATION_SOLUTION | Freq: Four times a day (QID) | RESPIRATORY_TRACT | Status: DC | PRN
  Administered 2013-07-05: 3 mL via RESPIRATORY_TRACT
  Filled 2013-07-04: qty 3

## 2013-07-04 MED ORDER — ONDANSETRON HCL 4 MG/2ML IJ SOLN
4.0000 mg | Freq: Four times a day (QID) | INTRAMUSCULAR | Status: DC | PRN
  Administered 2013-07-04 – 2013-07-05 (×3): 4 mg via INTRAVENOUS
  Filled 2013-07-04 (×3): qty 2

## 2013-07-04 MED ORDER — ONDANSETRON HCL 4 MG/2ML IJ SOLN
INTRAMUSCULAR | Status: DC | PRN
  Administered 2013-07-04 (×2): 4 mg via INTRAVENOUS

## 2013-07-04 MED ORDER — ONDANSETRON HCL 4 MG/2ML IJ SOLN
INTRAMUSCULAR | Status: AC
  Filled 2013-07-04: qty 2

## 2013-07-04 MED ORDER — TRIAMTERENE-HCTZ 75-50 MG PO TABS
0.5000 | ORAL_TABLET | Freq: Every day | ORAL | Status: DC
  Filled 2013-07-04: qty 0.5

## 2013-07-04 MED ORDER — BUPIVACAINE-EPINEPHRINE 0.25% -1:200000 IJ SOLN
INTRAMUSCULAR | Status: DC | PRN
  Administered 2013-07-04: 8 mL

## 2013-07-04 MED ORDER — FENTANYL CITRATE 0.05 MG/ML IJ SOLN
INTRAMUSCULAR | Status: DC | PRN
  Administered 2013-07-04: 75 ug via INTRAVENOUS
  Administered 2013-07-04: 50 ug via INTRAVENOUS

## 2013-07-04 MED ORDER — HEMOSTATIC AGENTS (NO CHARGE) OPTIME
TOPICAL | Status: DC | PRN
  Administered 2013-07-04: 1 via TOPICAL

## 2013-07-04 MED ORDER — ONDANSETRON HCL 4 MG PO TABS: 4.0000 mg | ORAL_TABLET | Freq: Four times a day (QID) | ORAL | Status: DC | PRN

## 2013-07-04 MED ORDER — METHOCARBAMOL 500 MG PO TABS
500.0000 mg | ORAL_TABLET | Freq: Four times a day (QID) | ORAL | Status: DC | PRN
  Administered 2013-07-05 – 2013-07-07 (×6): 500 mg via ORAL
  Filled 2013-07-04 (×6): qty 1

## 2013-07-04 MED ORDER — VITAMIN D3 25 MCG (1000 UNIT) PO TABS
1000.0000 [IU] | ORAL_TABLET | Freq: Every day | ORAL | Status: DC
  Administered 2013-07-04 – 2013-07-07 (×3): 1000 [IU] via ORAL
  Filled 2013-07-04 (×4): qty 1

## 2013-07-04 MED ORDER — LORATADINE 10 MG PO TABS
10.0000 mg | ORAL_TABLET | Freq: Every day | ORAL | Status: DC | PRN
  Filled 2013-07-04: qty 1

## 2013-07-04 MED ORDER — TEMAZEPAM 15 MG PO CAPS
30.0000 mg | ORAL_CAPSULE | Freq: Every evening | ORAL | Status: DC | PRN
Start: 2013-07-04 — End: 2013-07-07

## 2013-07-04 MED ORDER — DEXAMETHASONE SODIUM PHOSPHATE 4 MG/ML IJ SOLN
INTRAMUSCULAR | Status: AC
  Filled 2013-07-04: qty 2

## 2013-07-04 MED ORDER — HYDROMORPHONE HCL PF 1 MG/ML IJ SOLN: 0.2500 mg | INTRAMUSCULAR | Status: DC | PRN

## 2013-07-04 MED ORDER — LIDOCAINE HCL (CARDIAC) 20 MG/ML IV SOLN
INTRAVENOUS | Status: AC
  Filled 2013-07-04: qty 5

## 2013-07-04 MED ORDER — CEFAZOLIN SODIUM-DEXTROSE 2-3 GM-% IV SOLR
2.0000 g | Freq: Four times a day (QID) | INTRAVENOUS | Status: AC
  Administered 2013-07-04 – 2013-07-05 (×3): 2 g via INTRAVENOUS
  Filled 2013-07-04 (×3): qty 50

## 2013-07-04 MED ORDER — ONE-DAILY MULTI VITAMINS PO TABS: 1.0000 | ORAL_TABLET | Freq: Every day | ORAL | Status: DC

## 2013-07-04 MED ORDER — LIDOCAINE HCL (CARDIAC) 20 MG/ML IV SOLN
INTRAVENOUS | Status: DC | PRN
  Administered 2013-07-04: 80 mg via INTRAVENOUS

## 2013-07-04 MED ORDER — ACETAMINOPHEN 650 MG RE SUPP
650.0000 mg | Freq: Four times a day (QID) | RECTAL | Status: DC | PRN
Start: 2013-07-04 — End: 2013-07-07

## 2013-07-04 MED ORDER — LACTATED RINGERS IV SOLN
INTRAVENOUS | Status: DC | PRN
  Administered 2013-07-04: 09:00:00 via INTRAVENOUS

## 2013-07-04 MED ORDER — MIDAZOLAM HCL 2 MG/2ML IJ SOLN
INTRAMUSCULAR | Status: AC
  Administered 2013-07-04: 2 mg via INTRAVENOUS
  Filled 2013-07-04: qty 2

## 2013-07-04 MED ORDER — SODIUM CHLORIDE 0.9 % IR SOLN
Status: DC | PRN
  Administered 2013-07-04 (×2): 1000 mL

## 2013-07-04 SURGICAL SUPPLY — 84 items
BLADE SAW SAG 73X25 THK (BLADE) ×1
BLADE SAW SGTL 73X25 THK (BLADE) ×1 IMPLANT
BODY ANATOMIC PROXIMAL SZ10 (Shoulder) ×1 IMPLANT
BUR SURG 4X8 MED (BURR) IMPLANT
BURR SURG 4X8 MED (BURR)
CEMENT BONE DEPUY (Cement) ×2 IMPLANT
COVER SURGICAL LIGHT HANDLE (MISCELLANEOUS) ×2 IMPLANT
DRAPE INCISE IOBAN 66X45 STRL (DRAPES) ×5 IMPLANT
DRAPE U-SHAPE 47X51 STRL (DRAPES) ×2 IMPLANT
DRAPE X-RAY CASS 24X20 (DRAPES) IMPLANT
DRILL BIT 5/64 (BIT) ×2 IMPLANT
DRSG ADAPTIC 3X8 NADH LF (GAUZE/BANDAGES/DRESSINGS) ×2 IMPLANT
DRSG PAD ABDOMINAL 8X10 ST (GAUZE/BANDAGES/DRESSINGS) ×4 IMPLANT
DURAPREP 26ML APPLICATOR (WOUND CARE) ×2 IMPLANT
ELECT BLADE 4.0 EZ CLEAN MEGAD (MISCELLANEOUS) ×2
ELECT NDL TIP 2.8 STRL (NEEDLE) ×1 IMPLANT
ELECT NEEDLE TIP 2.8 STRL (NEEDLE) ×2 IMPLANT
ELECT REM PT RETURN 9FT ADLT (ELECTROSURGICAL) ×2
ELECTRODE BLDE 4.0 EZ CLN MEGD (MISCELLANEOUS) ×1 IMPLANT
ELECTRODE REM PT RTRN 9FT ADLT (ELECTROSURGICAL) ×1 IMPLANT
GLENOID ANCHOR PEG CROSSLK 44 (Orthopedic Implant) ×1 IMPLANT
GLOVE BIOGEL PI IND STRL 6.5 (GLOVE) IMPLANT
GLOVE BIOGEL PI IND STRL 8 (GLOVE) IMPLANT
GLOVE BIOGEL PI INDICATOR 6.5 (GLOVE) ×1
GLOVE BIOGEL PI INDICATOR 8 (GLOVE) ×1
GLOVE BIOGEL PI ORTHO PRO 7.5 (GLOVE)
GLOVE BIOGEL PI ORTHO PRO SZ7 (GLOVE) ×1
GLOVE BIOGEL PI ORTHO PRO SZ8 (GLOVE) ×1
GLOVE ORTHO TXT STRL SZ7.5 (GLOVE) ×1 IMPLANT
GLOVE PI ORTHO PRO STRL 7.5 (GLOVE) ×1 IMPLANT
GLOVE PI ORTHO PRO STRL SZ7 (GLOVE) IMPLANT
GLOVE PI ORTHO PRO STRL SZ8 (GLOVE) ×1 IMPLANT
GLOVE SURG ORTHO 8.5 STRL (GLOVE) ×4 IMPLANT
GLOVE SURG SS PI 7.0 STRL IVOR (GLOVE) ×2 IMPLANT
GLOVE SURG SS PI 8.0 STRL IVOR (GLOVE) ×1 IMPLANT
GOWN STRL REUS W/ TWL XL LVL3 (GOWN DISPOSABLE) ×3 IMPLANT
GOWN STRL REUS W/TWL 2XL LVL3 (GOWN DISPOSABLE) ×1 IMPLANT
GOWN STRL REUS W/TWL XL LVL3 (GOWN DISPOSABLE) ×4
HANDPIECE INTERPULSE COAX TIP (DISPOSABLE)
HEAD HUMERAL ECC 44X18MM SHLDR (Head) ×1 IMPLANT
KIT BASIN OR (CUSTOM PROCEDURE TRAY) ×2 IMPLANT
KIT ROOM TURNOVER OR (KITS) ×2 IMPLANT
MANIFOLD NEPTUNE II (INSTRUMENTS) ×2 IMPLANT
NDL 1/2 CIR MAYO (NEEDLE) ×1 IMPLANT
NDL 18GX1X1/2 (RX/OR ONLY) (NEEDLE) IMPLANT
NDL HYPO 25GX1X1/2 BEV (NEEDLE) ×1 IMPLANT
NDL SUT 6 .5 CRC .975X.05 MAYO (NEEDLE) ×1 IMPLANT
NEEDLE 1/2 CIR MAYO (NEEDLE) ×2 IMPLANT
NEEDLE 18GX1X1/2 (RX/OR ONLY) (NEEDLE) ×2 IMPLANT
NEEDLE HYPO 25GX1X1/2 BEV (NEEDLE) ×2 IMPLANT
NEEDLE MAYO TAPER (NEEDLE) ×2
NS IRRIG 1000ML POUR BTL (IV SOLUTION) ×3 IMPLANT
PACK SHOULDER (CUSTOM PROCEDURE TRAY) ×2 IMPLANT
PAD ARMBOARD 7.5X6 YLW CONV (MISCELLANEOUS) ×4 IMPLANT
PIN METAGLENE 2.5 (PIN) ×1 IMPLANT
SET HNDPC FAN SPRY TIP SCT (DISPOSABLE) IMPLANT
SLING ARM IMMOBILIZER LRG (SOFTGOODS) ×1 IMPLANT
SLING ARM IMMOBILIZER MED (SOFTGOODS) ×1 IMPLANT
SMARTMIX MINI TOWER (MISCELLANEOUS) ×2
SPONGE GAUZE 4X4 12PLY (GAUZE/BANDAGES/DRESSINGS) ×2 IMPLANT
SPONGE LAP 18X18 X RAY DECT (DISPOSABLE) ×2 IMPLANT
SPONGE LAP 4X18 X RAY DECT (DISPOSABLE) ×2 IMPLANT
SPONGE SURGIFOAM ABS GEL SZ50 (HEMOSTASIS) ×1 IMPLANT
STEM STANDARD SZ 10 113MM (Stem) ×2 IMPLANT
STEM STD SZ 10 113MM (Stem) IMPLANT
STRIP CLOSURE SKIN 1/2X4 (GAUZE/BANDAGES/DRESSINGS) ×2 IMPLANT
SUCTION FRAZIER TIP 10 FR DISP (SUCTIONS) ×2 IMPLANT
SUT FIBERWIRE #2 38 T-5 BLUE (SUTURE) ×10
SUT MNCRL AB 4-0 PS2 18 (SUTURE) ×2 IMPLANT
SUT PROLENE 6 0 C 1 24 (SUTURE) ×1 IMPLANT
SUT VIC AB 0 CT1 27 (SUTURE) ×2
SUT VIC AB 0 CT1 27XBRD ANBCTR (SUTURE) ×1 IMPLANT
SUT VIC AB 2-0 CT1 27 (SUTURE) ×4
SUT VIC AB 2-0 CT1 TAPERPNT 27 (SUTURE) ×1 IMPLANT
SUT VICRYL AB 2 0 TIES (SUTURE) ×2 IMPLANT
SUTURE FIBERWR #2 38 T-5 BLUE (SUTURE) ×2 IMPLANT
SYR 3ML LL SCALE MARK (SYRINGE) ×1 IMPLANT
SYR CONTROL 10ML LL (SYRINGE) ×2 IMPLANT
TOWEL OR 17X24 6PK STRL BLUE (TOWEL DISPOSABLE) ×2 IMPLANT
TOWEL OR 17X26 10 PK STRL BLUE (TOWEL DISPOSABLE) ×2 IMPLANT
TOWER SMARTMIX MINI (MISCELLANEOUS) ×1 IMPLANT
TRAY FOLEY CATH 16FRSI W/METER (SET/KITS/TRAYS/PACK) ×1 IMPLANT
WATER STERILE IRR 1000ML POUR (IV SOLUTION) ×2 IMPLANT
YANKAUER SUCT BULB TIP NO VENT (SUCTIONS) IMPLANT

## 2013-07-04 NOTE — Preoperative (Signed)
Beta Blockers   Reason not to administer Beta Blockers:Not Applicable 

## 2013-07-04 NOTE — Brief Op Note (Signed)
07/04/2013  12:52 PM  PATIENT:  Christine Valdez  63 y.o. female  PRE-OPERATIVE DIAGNOSIS:  RIGHT SHOULDER OA, END STAGED  POST-OPERATIVE DIAGNOSIS:  RIGHT SHOULDER OA, END STAGED  PROCEDURE:  Procedure(s): RIGHT TOTAL SHOULDER ARTHROPLASTY (Right)[employer]  SURGEON:  Surgeon(s) and Role:    * Augustin Schooling, MD - Primary  PHYSICIAN ASSISTANT:   ASSISTANTS: Ky Barban RNFA   ANESTHESIA:   regional and general  EBL:  Total I/O In: -  Out: 100 [Blood:100]  BLOOD ADMINISTERED:none  DRAINS: none   LOCAL MEDICATIONS USED:  MARCAINE     SPECIMEN:  No Specimen  DISPOSITION OF SPECIMEN:  N/A  COUNTS:  YES  TOURNIQUET:  * No tourniquets in log *  DICTATION: .Other Dictation: Dictation Number [id number]  PLAN OF CARE: Admit to inpatient   PATIENT DISPOSITION:  PACU - hemodynamically stable.   Delay start of Pharmacological VTE agent (>24hrs) due to surgical blood loss or risk of bleeding: not applicable

## 2013-07-04 NOTE — Anesthesia Postprocedure Evaluation (Signed)
Anesthesia Post Note  Patient: Christine Valdez  Procedure(s) Performed: Procedure(s) (LRB): RIGHT TOTAL SHOULDER ARTHROPLASTY (Right)  Anesthesia type: general  Patient location: PACU  Post pain: Pain level controlled  Post assessment: Patient's Cardiovascular Status Stable  Last Vitals:  Filed Vitals:   07/04/13 1420  BP:   Pulse: 79  Temp: 36.7 C  Resp: 14    Post vital signs: Reviewed and stable  Level of consciousness: sedated  Complications: No apparent anesthesia complications

## 2013-07-04 NOTE — Transfer of Care (Signed)
Immediate Anesthesia Transfer of Care Note  Patient: Christine Valdez  Procedure(s) Performed: Procedure(s): RIGHT TOTAL SHOULDER ARTHROPLASTY (Right)  Patient Location: PACU  Anesthesia Type:General  Level of Consciousness: awake, alert  and oriented  Airway & Oxygen Therapy: Patient Spontanous Breathing and Patient connected to nasal cannula oxygen  Post-op Assessment: Report given to PACU RN, Post -op Vital signs reviewed and stable and Patient moving all extremities X 4  Post vital signs: Reviewed and stable  Complications: No apparent anesthesia complications

## 2013-07-04 NOTE — Interval H&P Note (Signed)
History and Physical Interval Note:  07/04/2013 10:07 AM  Christine Valdez  has presented today for surgery, with the diagnosis of RIGHT SHOULDER OA  The various methods of treatment have been discussed with the patient and family. After consideration of risks, benefits and other options for treatment, the patient has consented to  Procedure(s): RIGHT TOTAL SHOULDER ARTHROPLASTY (Right) as a surgical intervention .  The patient's history has been reviewed, patient examined, no change in status, stable for surgery.  I have reviewed the patient's chart and labs.  Questions were answered to the patient's satisfaction.     Ryker Sudbury,STEVEN R

## 2013-07-04 NOTE — Discharge Instructions (Signed)
Ice to the right shoulder at all times,  Wear sling out of the home, in the house, ok to remove and hug a pillow. Keep incision clean and dry and covered for 5 days, then ok to get wet in the shower.  No heavy liftin pushing, pulling.  Do exercises every hour while awake, lap slides, door hinge exercises  Wrist and elbow ROM  Follow up in two weeks with Dr Veverly Fells  [phone]

## 2013-07-04 NOTE — Anesthesia Preprocedure Evaluation (Addendum)
Anesthesia Evaluation  Patient identified by MRN, date of birth, ID band Patient awake    Reviewed: Allergy & Precautions, H&P , NPO status , Patient's Chart, lab work & pertinent test results  Airway Mallampati: I TM Distance: >3 FB Neck ROM: Full    Dental no notable dental hx. (+) Teeth Intact, Dental Advisory Given   Pulmonary neg pulmonary ROS, asthma ,  breath sounds clear to auscultation  Pulmonary exam normal       Cardiovascular hypertension, On Medications Rhythm:Regular Rate:Normal     Neuro/Psych  Headaches, negative psych ROS   GI/Hepatic negative GI ROS, Neg liver ROS,   Endo/Other  Hypothyroidism   Renal/GU negative Renal ROS  negative genitourinary   Musculoskeletal   Abdominal   Peds  Hematology negative hematology ROS (+)   Anesthesia Other Findings   Reproductive/Obstetrics negative OB ROS                         Anesthesia Physical Anesthesia Plan  ASA: II  Anesthesia Plan: General and Regional   Post-op Pain Management:    Induction: Intravenous  Airway Management Planned: Oral ETT  Additional Equipment:   Intra-op Plan:   Post-operative Plan: Extubation in OR  Informed Consent: I have reviewed the patients History and Physical, chart, labs and discussed the procedure including the risks, benefits and alternatives for the proposed anesthesia with the patient or authorized representative who has indicated his/her understanding and acceptance.   Dental advisory given  Plan Discussed with: CRNA  Anesthesia Plan Comments:         Anesthesia Quick Evaluation

## 2013-07-04 NOTE — Progress Notes (Signed)
Utilization review completed.  

## 2013-07-04 NOTE — Anesthesia Procedure Notes (Addendum)
Anesthesia Regional Block:  Interscalene brachial plexus block  Pre-Anesthetic Checklist: ,, timeout performed, Correct Patient, Correct Site, Correct Laterality, Correct Procedure, Correct Position, site marked, Risks and benefits discussed, pre-op evaluation,  At surgeon's request and post-op pain management  Laterality: Right  Prep: Maximum Sterile Barrier Precautions used and chloraprep       Needles:  Injection technique: Single-shot  Needle Type: Echogenic Stimulator Needle     Needle Length: 5cm 5 cm Needle Gauge: 22 and 22 G    Additional Needles:  Procedures: ultrasound guided (picture in chart) and nerve stimulator Interscalene brachial plexus block  Nerve Stimulator or Paresthesia:  Response: Biceps response,   Additional Responses:   Narrative:  Start time: 07/04/2013 9:16 AM End time: 07/04/2013 9:25 AM Injection made incrementally with aspirations every 5 mL. Anesthesiologist: Ola Spurr, MD  Additional Notes: 2% Lidocaine skin wheel.    Procedure Name: Intubation Date/Time: 07/04/2013 10:25 AM Performed by: Kyung Rudd Pre-anesthesia Checklist: Patient identified, Emergency Drugs available, Suction available, Patient being monitored and Timeout performed Patient Re-evaluated:Patient Re-evaluated prior to inductionOxygen Delivery Method: Circle system utilized Preoxygenation: Pre-oxygenation with 100% oxygen Intubation Type: IV induction Ventilation: Mask ventilation without difficulty Laryngoscope Size: Mac and 3 Grade View: Grade I Tube type: Oral Tube size: 7.0 mm Number of attempts: 1 Airway Equipment and Method: Stylet Placement Confirmation: ETT inserted through vocal cords under direct vision,  positive ETCO2 and breath sounds checked- equal and bilateral Secured at: 21 cm Tube secured with: Tape Dental Injury: Teeth and Oropharynx as per pre-operative assessment

## 2013-07-05 ENCOUNTER — Inpatient Hospital Stay (HOSPITAL_COMMUNITY): Payer: BC Managed Care – PPO

## 2013-07-05 LAB — BASIC METABOLIC PANEL
BUN: 12 mg/dL (ref 6–23)
CO2: 25 meq/L (ref 19–32)
Calcium: 9.8 mg/dL (ref 8.4–10.5)
Chloride: 95 mEq/L — ABNORMAL LOW (ref 96–112)
Creatinine, Ser: 0.75 mg/dL (ref 0.50–1.10)
GFR calc Af Amer: >90 mL/min (ref >90)
GLUCOSE: 149 mg/dL — AB (ref 70–99)
Potassium: 4.4 mEq/L (ref 3.7–5.3)
Sodium: 134 mEq/L — ABNORMAL LOW (ref 137–147)

## 2013-07-05 LAB — HEMOGLOBIN AND HEMATOCRIT, BLOOD
HCT: 35.6 % — ABNORMAL LOW (ref 36.0–46.0)
Hemoglobin: 12.2 g/dL (ref 12.0–15.0)

## 2013-07-05 MED ORDER — ALBUTEROL SULFATE (2.5 MG/3ML) 0.083% IN NEBU
2.5000 mg | INHALATION_SOLUTION | Freq: Three times a day (TID) | RESPIRATORY_TRACT | Status: DC
  Administered 2013-07-06 – 2013-07-07 (×4): 2.5 mg via RESPIRATORY_TRACT
  Filled 2013-07-05 (×5): qty 3

## 2013-07-05 MED ORDER — ALBUTEROL SULFATE (2.5 MG/3ML) 0.083% IN NEBU: 3.0000 mL | INHALATION_SOLUTION | RESPIRATORY_TRACT | Status: DC | PRN

## 2013-07-05 MED ORDER — ALBUTEROL SULFATE (2.5 MG/3ML) 0.083% IN NEBU
2.5000 mg | INHALATION_SOLUTION | Freq: Four times a day (QID) | RESPIRATORY_TRACT | Status: DC
  Administered 2013-07-05: 2.5 mg via RESPIRATORY_TRACT
  Filled 2013-07-05: qty 3

## 2013-07-05 NOTE — Op Note (Signed)
NAMERUDY, DOMEK NO.:  [id number]  MEDICAL RECORD NO.:  [id number]  LOCATION:  5N31C                        FACILITY:  [facility]  PHYSICIAN:  Doran Heater. Veverly Fells, M.D. DATE OF BIRTH:  1950/04/15  DATE OF PROCEDURE:  07/04/2013 DATE OF DISCHARGE:                              OPERATIVE REPORT   PREOPERATIVE DIAGNOSIS:  Right shoulder end-stage osteoarthritis.  POSTOPERATIVE DIAGNOSIS:  Right shoulder end-stage osteoarthritis.  PROCEDURE PERFORMED:  Right total shoulder arthroplasty using DePuy Global Unite system.  ATTENDING SURGEON:  Doran Heater. Veverly Fells, MD.  ASSISTANT:  Ky Barban, RNFA.  General anesthesia plus interscalene block anesthesia was used.  ESTIMATED BLOOD LOSS:  Minimal.  FLUID REPLACED:  1200 mL crystalloid.  INSTRUMENT COUNTS:  Correct.  COMPLICATIONS:  There were no complications.  Perioperative antibiotics were given.  INDICATIONS:  The patient is a 63 year old female with worsening right shoulder pain secondary to end-stage arthritis.  The patient has had progressive pain despite conservative management.  Desires operative treatment to relieve pain and restore function of her shoulder and informed consent was obtained.  DESCRIPTION OF PROCEDURE:  After adequate level of anesthesia was achieved, the patient was positioned in the modified beach-chair position.  Right shoulder correctly identified and sterilely prepped and draped in usual manner.  After our time-out was called, we went ahead and entered the shoulder in standard deltopectoral incision.  We started at the coracoid process extending down to the anterior humerus. Dissection down through subcutaneous tissues using needle-tip Bovie. Identified the cephalic vein, took it laterally with the deltoid. Pectoralis taken medially.  Conjoined tendon identified and retracted medially.  We identified the biceps tendon, and then released the subscapularis tendon subperiosteally  tagging that with #2 FiberWire suture in a modified Mason-Allen suture technique for repair at the end of the surgery.  Released the inferior capsule off the neck of the humerus progressively externally rotated.  Large osteophytes were noted. There was no visible cartilage noted on the humerus.  We placed our T- handled Crego and had the patient's elbow at the patient's side with about 20 degrees of external rotation at the elbow.  We went ahead and resected using our humeral resection guide.  Once we had the humeral resection cut made done with the oscillating saw, we removed that head to the back table when used it for bone graft.  We then removed the remaining osteophytes using a rongeur, and then went ahead and prepared the remainder of the humerus.  We did the intramedullary reaming up to a size 10 and then went ahead and punched for the 10 Global Unite humerus. We impacted the trial stem in place to make sure it was fully seated. We then removed the trial and then subluxed the humerus posteriorly.  We did a 360-degree capsular removal and glenoid labrum removal.  We did not see significant cartilage remaining on the glenoid face either.  We protected the axillary nerve during this portion of procedure with adequate visualization placed our central guide pin and reamed up to the 44 glenoid and did our peripheral hand reaming.  We then drilled our central peg hole for the anchor peg glenoid prosthesis and then we did 3  peripheral holes using the drill hole guide.  We then placed our trial 44 trial anchor peg glenoid, and then were satisfied that was seated appropriately.  We then thoroughly irrigated and dried the peripheral holes and then cemented the component into place using DePuy 1 cement vacuum mixed and then injected into each of those 3 peripheral holes. We kept the pressure on that glenoid that was fully seated until the cement hardened on the table.  We then went ahead and  trialed with the 10 stem and a 44 x 18 eccentric head.  We had excellent soft tissue balancing and range of motion.  We removed the trial components, drilled holes centered on the biceps groove through the humerus placing #2 FiberWire sutures, so that we could repair the subscapularis anatomically.  We then irrigated again and then took the real size 10 Global Unite stem and using impaction grafting technique with available bone graft in the head, impacted the humerus in a press-fit fashion into place.  We then impacted 44 x 18 eccentric head giving best coverage for the proximal humerus.  We were pleased with the placement of the implant and then reduced the shoulder.  We were again happy with balancing.  We deflected posteriorly 50% of the head diameter and also inferiorly.  We then repaired the subscap anatomically using #2 FiberWire through bone and also rotator interval closure.  Then, we irrigated thoroughly and then closed the deltopectoral interval with 0 Vicryl suture followed by 2-0 Vicryl subcutaneous closure and 4-0 Monocryl for skin.  Steri-Strips applied followed by sterile dressing.  The patient tolerated the surgery well.     Doran Heater. Veverly Fells, M.D.     SRN/MEDQ  D:  07/04/2013  T:  07/05/2013  Job:  [id number]

## 2013-07-05 NOTE — Progress Notes (Signed)
Christine Valdez  MRN: [id number] DOB/Age: 1950/10/19 63 y.o. Physician: Ander Slade, M.D. 1 Day Post-Op Procedure(s) (LRB): RIGHT TOTAL SHOULDER ARTHROPLASTY (Right)  Subjective: Resting at bedside in chair, moderate pain earlier, medicated now with improved pain control. Vital Signs Temp:  [98 F (36.7 C)-99 F (37.2 C)] 98.4 F (36.9 C) (03/28 0528) Pulse Rate:  [72-95] 93 (03/28 0528) Resp:  [12-21] 16 (03/28 0528) BP: (103-134)/(59-90) 130/90 mmHg (03/28 0528) SpO2:  [88 %-96 %] 92 % (03/28 0528) Weight:  [70.761 kg (156 lb)] 70.761 kg (156 lb) (03/27 1515)  Lab Results  Recent Labs  07/05/13 0427  HGB 12.2  HCT 35.6*   BMET  Recent Labs  07/05/13 0427  NA 134*  K 4.4  CL 95*  CO2 25  GLUCOSE 149*  BUN 12  CREATININE 0.75  CALCIUM 9.8   INR  Date Value Ref Range Status  06/30/2013 0.91  0.00 - 1.49 Final     Exam  Dressings dry, N/V intact RUE, OT in room preparing to begin exercise session  Plan TSA protocol, With pain control issues will plan for d/c Sunday  Prather Failla M 07/05/2013, 11:11 AM

## 2013-07-05 NOTE — Progress Notes (Signed)
Occupational Therapy Treatment Patient Details Name: ANYI FELS MRN: [id number] DOB: [birth year] Today's Date: 07/05/2013    History of present illness Right total shoulder arthroplasty   OT comments  This 63 yo doing much better pain wise this PM (of note it was discovered between my AM and PM session that her sats were low so now on O2).  Follow Up Recommendations   (Per MD)    Equipment Recommendations  None recommended by OT       Precautions / Restrictions Precautions Precautions: Shoulder Shoulder Interventions: Shoulder sling/immobilizer;Off for dressing/bathing/exercises Required Braces or Orthoses: Sling Restrictions Weight Bearing Restrictions: Yes RUE Weight Bearing: Non weight bearing       Mobility Bed Mobility               General bed mobility comments: Pt up on BSC when I entered  Transfers Overall transfer level: Needs assistance   Transfers: Sit to/from Stand Sit to Stand: Min guard                                  Cognition   Behavior During Therapy: [facility] for tasks assessed/performed Overall Cognitive Status: Within Functional Limits for tasks assessed                         Exercises Pendulum exercises (written home exercise program): Moderate assistance          Pertinent Vitals/ Pain       No c/o         Frequency Min 3X/week     Progress Toward Goals  OT Goals(current goals can now be found in the care plan section)  Progress towards OT goals: Progressing toward goals     Plan Discharge plan remains appropriate    End of Session    Activity Tolerance Patient tolerated treatment well   Patient Left  (sitting EOB with NT)           Time: 5284-1324 OT Time Calculation (min): 16 min  Charges: OT General Charges $OT Visit: 1 Procedure OT Treatments $Therapeutic Exercise: 8-22 mins  Almon Register [phone] 07/05/2013, 5:34 PM

## 2013-07-05 NOTE — Evaluation (Signed)
Occupational Therapy Evaluation Patient Details Name: Christine Valdez MRN: [id number] DOB: [birth year] Today's Date: 07/05/2013    History of Present Illness Right total shoulder arthroplasty   Clinical Impression   This 63 yo female admitted and underwent above presents to acute OT with increased pain, increased nausea, decreased mobility due to both, decreased use of RUE all affecting pt's ability to take care of herself. Noted that orders on written for ADL's and sling; however this not Dr. Veverly Fells' typical protocol for an uncomplicated TSA so I telephone Jenetta Loges, [facility] for Dr. Onnie Graham (since they are coverage today). Spoke to [place] and she says that we should follow typical protocol and this what I did.    Follow Up Recommendations   (follow up per MD)    Equipment Recommendations  None recommended by OT       Precautions / Restrictions Precautions Precautions: Shoulder Shoulder Interventions: Shoulder sling/immobilizer;Off for dressing/bathing/exercises Required Braces or Orthoses: Sling Restrictions Weight Bearing Restrictions: Yes RUE Weight Bearing: Non weight bearing      Mobility Bed Mobility               General bed mobility comments: Pt up in recliner upon arrival  Transfers                 General transfer comment: Did not try this session due to pt with nausea with only arm exercises in supine         ADL                     General ADL Comments: Due to extreme pain and nausea, gave pt and husband handout and demonstrated to them about UBB/D as well as going over the other details on handout--will continue to educate               Pertinent Vitals/Pain 10/10 right shoulder with any activity; RN made aware and pt repositioned     Hand Dominance Right   Extremity/Trunk Assessment Upper Extremity Assessment Upper Extremity Assessment: RUE deficits/detail RUE Deficits / Details: Shoulder surgery this admission RUE  Coordination: decreased gross motor           Communication Communication Communication: No difficulties   Cognition Arousal/Alertness: Lethargic Behavior During Therapy: WFL for tasks assessed/performed Overall Cognitive Status: Within Functional Limits for tasks assessed                   Home Living Family/patient expects to be discharged to:: Private residence Living Arrangements: Spouse/significant other Available Help at Discharge: Family;Available 24 hours/day Type of Home: House Home Access: Stairs to enter [employer] of Steps: 3   Home Layout: One level     Bathroom Shower/Tub: Tub/shower unit;Curtain Shower/tub characteristics: Curtain       Home Equipment: None          Prior Functioning/Environment Level of Independence: Independent                OT Problem List: Decreased range of motion;Decreased activity tolerance;Impaired balance (sitting and/or standing);Impaired UE functional use;Decreased knowledge of precautions;Pain   OT Treatment/Interventions: Self-care/ADL training;DME and/or AE instruction;Balance training;Patient/family education;Therapeutic exercise    OT Goals(Current goals can be found in the care plan section) Acute Rehab OT Goals Patient Stated Goal: Home maybe tomorrow OT Goal Formulation: With patient Time For Goal Achievement: 07/12/13 Potential to Achieve Goals: Good  OT Frequency: Min 3X/week           End of Session:  Activity Tolerance: Patient limited by lethargy;Patient limited by pain (limited by nausea) Patient left: in chair;with call bell/phone within reach;with family/visitor present   Time: 1287-8676 OT Time Calculation (min): 37 min Charges:  OT General Charges $OT Visit: 1 Procedure OT Evaluation $Initial OT Evaluation Tier I: 1 Procedure OT Treatments $Self Care/Home Management : 8-22 mins $Therapeutic Exercise: 8-22 mins  Almon Register [phone] 07/05/2013, 12:10  PM

## 2013-07-06 NOTE — Progress Notes (Signed)
MD aware of patient's low oxygen saturation; patient unable to void in AM 3/29. Weaning patient to room air, patient keeping oxygen saturation in high 90's. Patient voiding on own; ambulatory with assist. Patient encouraged to use incentive spirometry. Patient's results of chest xray in chart. Will continue to monitor.

## 2013-07-06 NOTE — Progress Notes (Signed)
Occupational Therapy Treatment Patient Details Name: Christine Valdez MRN: [id number] DOB: 01/20/1951 Today's Date: 07/06/2013    History of present illness Right total shoulder arthroplasty   OT comments  Pt performed exercises and OT education on ADL information.   Follow Up Recommendations   (per MD)    Equipment Recommendations  None recommended by OT    Recommendations for Other Services      Precautions / Restrictions Precautions Precautions: Shoulder Type of Shoulder Precautions: [place] TSA protocol  Shoulder Interventions: Shoulder sling/immobilizer;Off for dressing/bathing/exercises Precaution Booklet Issued:  (shoulder handout in room) Required Braces or Orthoses: Sling Restrictions Weight Bearing Restrictions: Yes RUE Weight Bearing: Non weight bearing       Mobility Bed Mobility Overal bed mobility: Needs Assistance Bed Mobility: Supine to Sit;Sit to Supine     Supine to sit: Supervision Sit to supine: Supervision   General bed mobility comments: cues for precautions.  Transfers Overall transfer level: Needs assistance   Transfers: Sit to/from Stand Sit to Stand: Min guard              Balance                                   ADL       Upper Body Dressing : Sitting;Supervision/safety (spouse practiced donning/doffing sling)     Toilet Transfer: BSC;Min guard     Functional mobility during ADLs: Min guard General ADL Comments: Educated pt and spouse of information on shoulder handout. Spouse practiced donning/doffing sling and also bathing under pt's arm.      Vision                     Perception     Praxis      Cognition   Behavior During Therapy: [facility] for tasks assessed/performed Overall Cognitive Status: Within Functional Limits for tasks assessed                       Extremity/Trunk Assessment               Exercises Shoulder Exercises Pendulum Exercise:  Right;Standing Shoulder Flexion: AAROM;Right;10 reps;Supine Shoulder Extension: AAROM;Right;10 reps;Supine Shoulder External Rotation: AAROM;Right;10 reps;Supine Elbow Flexion: AROM;Right;20 reps;Standing Elbow Extension: AROM;Right;20 reps;Standing Wrist Flexion: AROM;Right;20 reps;Standing Wrist Extension: AROM;Right;20 reps;Standing Digit Composite Flexion: AROM;Right;20 reps;Standing Composite Extension: AROM;Right;Standing Donning/doffing shirt without moving shoulder:  (able to verbalize to therapist technique) Method for sponge bathing under operated UE: Minimal assistance Donning/doffing sling/immobilizer: Supervision/safety Correct positioning of sling/immobilizer: Caregiver independent with task Pendulum exercises (written home exercise program): Moderate assistance ROM for elbow, wrist and digits of operated UE: Supervision/safety Sling wearing schedule (on at all times/off for ADL's): Supervision/safety Proper positioning of operated UE when showering: Patient able to independently direct caregiver Positioning of UE while sleeping: Patient able to independently direct caregiver;Caregiver independent with task     General Comments      Pertinent Vitals/ Pain       Pain 8/10. Increased activity during session.   Home Living                                          Prior Functioning/Environment              Frequency Min 3X/week     Progress Toward  Goals  OT Goals(current goals can now be found in the care plan section)  Progress towards OT goals: Progressing toward goals  Acute Rehab OT Goals Patient Stated Goal: not stated OT Goal Formulation: With patient Time For Goal Achievement: 07/12/13 Potential to Achieve Goals: Good ADL Goals Pt Will Perform Upper Body Bathing: with set-up;with supervision;standing Pt Will Perform Upper Body Dressing: with min assist;sitting;standing (including sling) Pt/caregiver will Perform Home Exercise  Program: With written HEP provided;Right Upper extremity (per Dr. Veverly Fells' protocol for TSA)  Plan Discharge plan remains appropriate    End of Session Equipment Utilized During Treatment: Gait belt;Other (comment) (sling)  Activity Tolerance Patient tolerated treatment well   Patient Left in bed;with call bell/phone within reach;with family/visitor present   Nurse Communication  (redness under right arm)        Time: 0076-2263 OT Time Calculation (min): 51 min  Charges: OT General Charges $OT Visit: 1 Procedure OT Treatments $Self Care/Home Management : 8-22 mins $Therapeutic Exercise: 23-37 mins  Benito Mccreedy OTR/L [phone] 07/06/2013, 3:57 PM

## 2013-07-06 NOTE — Progress Notes (Signed)
Awake,crying c/o unable to void, bladder scanned 555 ml,in/out cath by [facility] for 500 cc clear yellow urine.po fluids enc. Sats drop to 80% ra,enc IS,TCDB 02 high 90's when wears 02 2l [place].Emotional c/o pain,prn given, was noted to be sleeping at 55mn rounds Called husband to come and stay with her for a while. Christine Valdez D

## 2013-07-06 NOTE — Progress Notes (Addendum)
   Subjective: 2 Days Post-Op Procedure(s) (LRB): RIGHT TOTAL SHOULDER ARTHROPLASTY (Right) Patient reports pain as mild.   Patient seen in rounds with Dr. Gladstone Lighter. Patient is doing well in regards to her shoulder. She has had some issues with poor O2 sats. She has been doing better since on oxygen. Has had some issues with voiding. Was trying to void while when we entered the room for rounds. She is anxious to go home.  Plan is to go Home after hospital stay.  Objective: Vital signs in last 24 hours: Temp:  [97.9 F (36.6 C)-98.5 F (36.9 C)] 98.5 F (36.9 C) (03/29 0625) Pulse Rate:  [85-100] 85 (03/29 0625) Resp:  [16-20] 20 (03/29 0625) BP: (122-138)/(65-77) 138/75 mmHg (03/29 0625) SpO2:  [93 %-98 %] 97 % (03/29 0625) FiO2 (%):  [32 %] 32 % (03/28 2133)  Intake/Output from previous day:  Intake/Output Summary (Last 24 hours) at 07/06/13 0819 Last data filed at 07/06/13 0200  Gross per 24 hour  Intake    240 ml  Output    500 ml  Net   -260 ml     Labs:  Recent Labs  07/05/13 0427  HGB 12.2    Recent Labs  07/05/13 0427  HCT 35.6*    Recent Labs  07/05/13 0427  NA 134*  K 4.4  CL 95*  CO2 25  BUN 12  CREATININE 0.75  GLUCOSE 149*  CALCIUM 9.8    EXAM General - Patient is Alert and Oriented Extremity - Neurologically intact Neurovascular intact No cellulitis present Dressing/Incision - clean, dry, no drainage Motor Function - intact, moving foot and toes well on exam.   Past Medical History  Diagnosis Date  . Allergy     uses Flonase daily as needed and takes Loratadine daily   . Hypothyroid   . Sjogren's syndrome   . 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 Insomnia     takes Restoril nightly as needed  . Depression     takes Celexa daily  . Hypertension     takes Maxzide daily   . Spinal headache     after 1st C/S  . Asthma     uses Albuterol daily as needed;exercise induced  . History of bronchitis     couple of months ago  was the last time  . Arthritis   . Joint pain   . Osteoarthritis   . Joint swelling   . History of colon polyps   . Interstitial cystitis   . Urinary urgency   . Anemia     only after birth of 2nd child  . History of shingles     Assessment/Plan: 2 Days Post-Op Procedure(s) (LRB): RIGHT TOTAL SHOULDER ARTHROPLASTY (Right) Active Problems:   Osteoarthrosis, unspecified whether generalized or localized, shoulder region  Estimated body mass index is 27.2 kg/(m^2) as calculated from the following:   Height as of this encounter: 5' 3.5" (1.613 m).   Weight as of this encounter: 70.761 kg (156 lb). Advance diet Up with therapy Plan for discharge tomorrow once O2 sat and voiding concerns are resolved.   Continue therapy. Start trying to wean patient off O2 to see how she responds. Patient is allergic to sulfa and therefore cannot have Flomax to help with voiding. Will discuss with pharmacy options if she continues to have voiding issues.   Iris Hairston LAUREN 07/06/2013, 8:19 AM

## 2013-07-07 NOTE — Progress Notes (Signed)
OT Cancellation and Discharge Note  Patient Details Name: Christine Valdez MRN: [id number] DOB: 1950/06/30   Cancelled Treatment:    Reason Eval/Treat Not Completed: Other (comment). Pt with no further OT concerns. Pt has handout instructions for R TSA exercises and pendulums. Pt's husband will be home to assist with ADLs and ROM as needed. Acute OT to sign off.   Juluis Rainier [phone] 07/07/2013, 3:21 PM

## 2013-07-07 NOTE — Progress Notes (Signed)
Patient provided with discharge instructions and follow up information. She is going home with assistance from her husband. Educated on importance of sling and ROM exercises daily. She has no further questions at this time and will be discharged to home shortly.

## 2013-07-07 NOTE — Progress Notes (Signed)
Orthopedic Tech Progress Note Patient Details:  Christine Valdez 06-13-1950 [id number] Ortho Devices Type of Ortho Device: Arm sling Ortho Device/Splint Location: RUE Ortho Device/Splint Interventions: Ordered  Replacement arm sling to go home with  Braulio Bosch 07/07/2013, 3:34 PM

## 2013-07-07 NOTE — Discharge Summary (Signed)
Physician Discharge Summary   Patient ID: Christine Valdez MRN: [id number] DOB/AGE: Jun 25, 1950 63 y.o.  Admit date: 07/04/2013 Discharge date: 07/07/2013  Admission Diagnoses:  Active Problems:   Osteoarthrosis, unspecified whether generalized or localized, shoulder region   Discharge Diagnoses:  Same   Surgeries: Procedure(s): RIGHT TOTAL SHOULDER ARTHROPLASTY on 07/04/2013   Consultants: PT/OT  Discharged Condition: Stable  Hospital Course: AMEIRAH KHATOON is an 63 y.o. female who was admitted 07/04/2013 with a chief complaint of No chief complaint on file. , and found to have a diagnosis of <principal problem not specified>.  They were brought to the operating room on 07/04/2013 and underwent the above named procedures.    The patient had an uncomplicated hospital course and was stable for discharge.  Recent vital signs:  Filed Vitals:   07/07/13 0709  BP: 128/73  Pulse: 87  Temp: 97.9 F (36.6 C)  Resp:     Recent laboratory studies:  Results for orders placed during the hospital encounter of 07/04/13  HEMOGLOBIN AND HEMATOCRIT, BLOOD      Result Value Ref Range   Hemoglobin 12.2  12.0 - 15.0 g/dL   HCT 35.6 (*) 36.0 - 10.9 %  BASIC METABOLIC PANEL      Result Value Ref Range   Sodium 134 (*) 137 - 147 mEq/L   Potassium 4.4  3.7 - 5.3 mEq/L   Chloride 95 (*) 96 - 112 mEq/L   CO2 25  19 - 32 mEq/L   Glucose, Bld 149 (*) 70 - 99 mg/dL   BUN 12  6 - 23 mg/dL   Creatinine, Ser 0.75  0.50 - 1.10 mg/dL   Calcium 9.8  8.4 - 10.5 mg/dL   GFR calc non Af Amer >90  >90 mL/min   GFR calc Af Amer >90  >90 mL/min    Discharge Medications:     Medication List         ALAWAY OP  Place 1 drop into both eyes daily as needed (for allergies).     albuterol 108 (90 BASE) MCG/ACT inhaler  Commonly known as:  PROVENTIL HFA;VENTOLIN HFA  Inhale 2 puffs into the lungs every 6 (six) hours as needed for shortness of breath.     cholecalciferol 1000 UNITS tablet    Commonly known as:  VITAMIN D  Take 1,000 Units by mouth daily.     citalopram 40 MG tablet  Commonly known as:  CELEXA  Take 40 mg by mouth daily.     docusate sodium 100 MG capsule  Commonly known as:  COLACE  Take 400 mg by mouth at bedtime.     fluticasone 50 MCG/ACT nasal spray  Commonly known as:  FLONASE  Place 2 sprays into both nostrils daily as needed for allergies.     HYDROmorphone 2 MG tablet  Commonly known as:  DILAUDID  Take 1 tablet (2 mg total) by mouth every 4 (four) hours as needed for severe pain.     levothyroxine 75 MCG tablet  Commonly known as:  SYNTHROID, LEVOTHROID  Take 75 mcg by mouth daily.     loratadine 10 MG tablet  Commonly known as:  CLARITIN  Take 10 mg by mouth daily as needed for allergies.     methocarbamol 500 MG tablet  Commonly known as:  ROBAXIN  Take 1 tablet (500 mg total) by mouth 3 (three) times daily as needed for muscle spasms.     multivitamin tablet  Take 1 tablet by mouth  daily.     oxyCODONE-acetaminophen 10-325 MG per tablet  Commonly known as:  PERCOCET  Take 1 tablet by mouth 3 (three) times daily as needed for pain.     potassium chloride SA 20 MEQ tablet  Commonly known as:  K-DUR,KLOR-CON  Take 20 mEq by mouth daily.     temazepam 30 MG capsule  Commonly known as:  RESTORIL  Take 1 capsule (30 mg total) by mouth at bedtime as needed for sleep.     triamterene-hydrochlorothiazide 75-50 MG per tablet  Commonly known as:  MAXZIDE  Take 0.5 tablets by mouth daily.        Diagnostic Studies: Dg Chest 1 View  07/05/2013   CLINICAL DATA:  Hypoxia, status post right shoulder arthroplasty  EXAM: CHEST - 1 VIEW  COMPARISON:  06/30/2013  FINDINGS: Low lung volumes with elevation of the right hemidiaphragm and suspected right basilar atelectasis. No pleural effusion or pneumothorax.  The heart is normal in size.  Right shoulder arthroplasty.  IMPRESSION: Low lung volumes with elevation of the right hemidiaphragm  and suspected right basilar atelectasis.  No evidence of acute cardiopulmonary disease.   Electronically Signed   By: Julian Hy M.D.   On: 07/05/2013 17:45   Dg Chest 2 View  06/30/2013   CLINICAL DATA:  Preoperative for right shoulder replacement; history of hypertension and bronchitis  EXAM: CHEST  2 VIEW  COMPARISON:  CHEST dated 12/19/2006  FINDINGS: The lungs are adequately inflated. There is no focal infiltrate. There is no pleural effusion or pneumothorax. The cardiac silhouette is normal in size. The pulmonary vascularity is not engorged. The mediastinum is normal in width. The trachea is midline. The thoracic vertebral bodies are preserved in height rib visualized. There are mild degenerative changes of both shoulders.  IMPRESSION: There is no evidence of active cardiopulmonary disease.   Electronically Signed   By: David  Martinique   On: 06/30/2013 16:44   Dg Shoulder Right Port  07/04/2013   CLINICAL DATA:  Total shoulder replacement  EXAM: PORTABLE RIGHT SHOULDER - 2+ VIEW  COMPARISON:  Chest 06/30/2013  FINDINGS: Total shoulder replacement on the right in satisfactory position and alignment. No fracture or acute complication.  IMPRESSION: Satisfactory right shoulder replacement  Right lower lobe atelectasis.   Electronically Signed   By: Franchot Gallo M.D.   On: 07/04/2013 14:25    Disposition: Final discharge disposition not confirmed       Future Appointments Provider Department Dept Phone   03/20/2014 2:30 PM [place], MD [facility] [phone]      Follow-up Information   Follow up with NORRIS,STEVEN R, MD. Call in 2 weeks. [phone])    Specialty:  Orthopedic Surgery   Contact information:   [address] [place] [address] [phone]        Signed: Ventura Bruns 07/07/2013, 12:35 PM

## 2013-07-07 NOTE — Progress Notes (Signed)
   Subjective: 3 Days Post-Op Procedure(s) (LRB): RIGHT TOTAL SHOULDER ARTHROPLASTY (Right)  Pt feeling much better  Less pain today and able to urinate better Ready for d/c home Patient reports pain as mild.  Objective:   VITALS:   Filed Vitals:   07/07/13 0709  BP: 128/73  Pulse: 87  Temp: 97.9 F (36.6 C)  Resp:     Right shoulder incision healing well nv intact distally No rashes or edema  LABS  Recent Labs  07/05/13 0427  HGB 12.2  HCT 35.6*     Recent Labs  07/05/13 0427  NA 134*  K 4.4  BUN 12  CREATININE 0.75  GLUCOSE 149*     Assessment/Plan: 3 Days Post-Op Procedure(s) (LRB): RIGHT TOTAL SHOULDER ARTHROPLASTY (Right) D/c home today F/u in 2 weeks      Merla Riches, MPAS, PA-C  07/07/2013, 12:34 PM

## 2013-07-08 ENCOUNTER — Encounter (HOSPITAL_COMMUNITY): Payer: Self-pay | Admitting: Orthopedic Surgery

## 2013-11-05 ENCOUNTER — Other Ambulatory Visit: Payer: Self-pay

## 2013-11-05 DIAGNOSIS — Z1231 Encounter for screening mammogram for malignant neoplasm of breast: Secondary | ICD-10-CM

## 2013-11-12 ENCOUNTER — Ambulatory Visit
Admission: RE | Admit: 2013-11-12 | Discharge: 2013-11-12 | Disposition: A | Discharge status: Home / Self Care | Payer: BC Managed Care – PPO | Source: Ambulatory Visit

## 2013-11-12 DIAGNOSIS — Z1231 Encounter for screening mammogram for malignant neoplasm of breast: Secondary | ICD-10-CM

## 2013-12-19 ENCOUNTER — Encounter: Payer: Self-pay | Admitting: Obstetrics and Gynecology

## 2014-01-23 ENCOUNTER — Other Ambulatory Visit: Payer: Self-pay

## 2014-02-09 ENCOUNTER — Encounter (HOSPITAL_COMMUNITY): Payer: Self-pay | Admitting: Orthopedic Surgery

## 2014-03-20 ENCOUNTER — Ambulatory Visit: Payer: Self-pay | Admitting: Obstetrics and Gynecology

## 2014-03-25 ENCOUNTER — Ambulatory Visit (INDEPENDENT_AMBULATORY_CARE_PROVIDER_SITE_OTHER): Payer: BC Managed Care – PPO | Admitting: Obstetrics and Gynecology

## 2014-03-25 ENCOUNTER — Encounter: Payer: Self-pay | Admitting: Obstetrics and Gynecology

## 2014-03-25 VITALS — BP 104/66 | HR 72 | Resp 18 | Ht 63.5 in | Wt 162.0 lb

## 2014-03-25 DIAGNOSIS — Z01419 Encounter for gynecological examination (general) (routine) without abnormal findings: Secondary | ICD-10-CM

## 2014-03-25 NOTE — Progress Notes (Signed)
Patient ID: Christine Valdez, female   DOB: 21-Jun-1950, 63 y.o.   MRN: [id number] 63 y.o. [id number] MarriedCaucasianF here for annual exam.   PCP:  Luetta Nutting, MD - [facility] in [facility] PCP does all prescriptions.  Had a total should replacement of the right side this year.  Now having problems with the left shoulder.   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  Has dermatologist.   Sister had breast cancer.   Patient's last menstrual period was 04/11/2003 (approximate).          Sexually active: No.  The current method of family planning is post menopausal status.    Exercising: No.  none. Smoker:  no  Health Maintenance: Pap:  04-15-12 wnl:neg HR HPV History of abnormal Pap:  no MMG:  11-12-13 heterogeneously dense/wnl:[facility] Colonoscopy:  05/2012 pre-cancerous polyps with Dr. Ferdinand Lango in [facility].   Repeat due 05/2014. BMD:   12/2007 wnl:[facility] TDaP:  Up to date with PCP Screening Labs:  Hb today:  PCP, Urine today: unable to void.   reports that she has never smoked. She has never used smokeless tobacco. She reports that she drinks alcohol. She reports that she does not use illicit drugs.  Past Medical History  Diagnosis Date  . Allergy     uses Flonase daily as needed and takes Loratadine daily   . Hypothyroid   . Sjogren's syndrome   . 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 Insomnia     takes Restoril nightly as needed  . Depression     takes Celexa daily  . Hypertension     takes Maxzide daily   . Spinal headache     after 1st C/S  . Asthma     uses Albuterol daily as needed;exercise induced  . History of bronchitis     couple of months ago was the last time  . Arthritis   . Joint pain   . Osteoarthritis   . Joint swelling   . History of colon polyps   . Interstitial cystitis   . Urinary urgency   . Anemia     only after birth of 2nd child  . History of shingles     Past Surgical History  Procedure Laterality Date  . Cesarean  section  1981/1984    x 2  . Tonsillectomy and adenoidectomy  age 11  . Carpal tunnel release Right 05/2007  . Colonoscopy    . Total shoulder arthroplasty Right 07/04/2013    DR NORRIS  . Total shoulder arthroplasty Right 07/04/2013    Procedure: RIGHT TOTAL SHOULDER ARTHROPLASTY;  Surgeon: Augustin Schooling, MD;  Location: [facility];  Service: Orthopedics;  Laterality: Right;    Current Outpatient Prescriptions  Medication Sig Dispense Refill  . albuterol (PROVENTIL HFA;VENTOLIN HFA) 108 (90 BASE) MCG/ACT inhaler Inhale 2 puffs into the lungs every 6 (six) hours as needed for shortness of breath.     . cholecalciferol (VITAMIN D) 1000 UNITS tablet Take 1,000 Units by mouth daily.    . citalopram (CELEXA) 40 MG tablet Take 40 mg by mouth daily.    Marland Kitchen docusate sodium (COLACE) 100 MG capsule Take 400 mg by mouth at bedtime.     . fluticasone (FLONASE) 50 MCG/ACT nasal spray Place 2 sprays into both nostrils daily as needed for allergies.    . fluticasone (FLONASE) 50 MCG/ACT nasal spray Place 2 sprays into the nose as needed.    Marland Kitchen  Ketotifen Fumarate (ALAWAY OP) Place 1 drop into both eyes daily as needed (for allergies).    Marland Kitchen levothyroxine (SYNTHROID, LEVOTHROID) 75 MCG tablet Take 75 mcg by mouth daily.    Marland Kitchen loratadine (CLARITIN) 10 MG tablet Take 10 mg by mouth daily as needed for allergies.     . Multiple Vitamin (MULTIVITAMIN) tablet Take 1 tablet by mouth daily.    . temazepam (RESTORIL) 30 MG capsule Take 1 capsule (30 mg total) by mouth at bedtime as needed for sleep. 30 capsule 1  . triamterene-hydrochlorothiazide (MAXZIDE) 75-50 MG per tablet Take 0.5 tablets by mouth daily.     . Omega-3 Fatty Acids (FISH OIL) 1000 MG CAPS Take 1 capsule by mouth daily.     No current facility-administered medications for this visit.    Family History  Problem Relation Age of Onset  . Breast cancer Sister 52    stage 4, chemo and radiation  . Diabetes Brother   . Epilepsy Daughter   . Hypertension  Mother   . Osteoporosis Mother   . Diabetes Father   . Hypertension Father     ROS:  Pertinent items are noted in HPI.  Otherwise, a comprehensive ROS was negative.  Exam:   BP 104/66 mmHg  Pulse 72  Resp 18  Ht 5' 3.5" (1.613 m)  Wt 162 lb (73.483 kg)  BMI 28.24 kg/m2  LMP 04/11/2003 (Approximate)      Height: 5' 3.5" (161.3 cm)  Ht Readings from Last 3 Encounters:  03/25/14 5' 3.5" (1.613 m)  07/04/13 5' 3.5" (1.613 m)  06/30/13 5' 3.5" (1.613 m)    General appearance: alert, cooperative and appears stated age Head: Normocephalic, without obvious abnormality, atraumatic Neck: no adenopathy, supple, symmetrical, trachea midline and thyroid normal to inspection and palpation Lungs: clear to auscultation bilaterally Breasts: normal appearance, no masses or tenderness, Inspection negative, No nipple retraction or dimpling, No nipple discharge or bleeding, No axillary or supraclavicular adenopathy Heart: regular rate and rhythm Abdomen: soft, non-tender; bowel sounds normal; no masses,  no organomegaly Extremities: extremities normal, atraumatic, no cyanosis or edema Skin: Skin color, texture, turgor normal. No rashes.  1.5 scaling red thickened skin of the right posterior shoulder. Lymph nodes: Cervical, supraclavicular, and axillary nodes normal. No abnormal inguinal nodes palpated Neurologic: Grossly normal   Pelvic: External genitalia:  no lesions              Urethra:  normal appearing urethra with no masses, tenderness or lesions              Bartholins and Skenes: normal                 Vagina: normal appearing vagina with normal color and discharge, no lesions              Cervix: no lesions              Pap taken: No. Bimanual Exam:  Uterus:  normal size, contour, position, consistency, mobility, non-tender              Adnexa: normal adnexa and no mass, fullness, tenderness               Rectovaginal: Confirms               Anus:  normal sphincter tone, no  lesions  Chaperone was present for exam.  A:  Well Woman with normal exam Skin lesion of right shoulder.   History of squamous cell  CA.  Family hx of breast CA.  P:   Mammogram yearly.  pap smear not indicated.  Patient will see her dermatologist about her shoulder.   return annually or prn

## 2014-03-25 NOTE — Patient Instructions (Signed)

## 2014-04-21 DIAGNOSIS — J4520 Mild intermittent asthma, uncomplicated: Secondary | ICD-10-CM | POA: Insufficient documentation

## 2014-06-29 ENCOUNTER — Telehealth: Payer: Self-pay | Admitting: Obstetrics and Gynecology

## 2014-06-29 NOTE — Telephone Encounter (Signed)
Pt would like name of a doctor for a colonoscopy procedure.

## 2014-06-29 NOTE — Telephone Encounter (Signed)
Spoke with patient. Patient states that she went for an appointment today to set up her colonoscopy in [facility]. "I am not sure that I want to have it done there after that appointment. I was hoping to get some other names of people I may be able to see in the [place] area." Provided patient with address and phone number for Dr.Mann at [facility] and Dr.Brodie at [facility]. Patient it agreeable. Will call to schedule an appointment if she would like to move providers.  Routing to provider for final review. Patient agreeable to disposition. Will close encounter

## 2014-09-25 ENCOUNTER — Encounter: Payer: Self-pay | Admitting: Obstetrics & Gynecology

## 2014-11-10 ENCOUNTER — Ambulatory Visit (INDEPENDENT_AMBULATORY_CARE_PROVIDER_SITE_OTHER): Payer: BLUE CROSS/BLUE SHIELD | Admitting: Emergency Medicine

## 2014-11-10 VITALS — BP 118/68 | HR 105 | Temp 99.1°F | Resp 18 | Ht 63.5 in | Wt 163.0 lb

## 2014-11-10 DIAGNOSIS — J020 Streptococcal pharyngitis: Secondary | ICD-10-CM

## 2014-11-10 DIAGNOSIS — J209 Acute bronchitis, unspecified: Secondary | ICD-10-CM | POA: Diagnosis not present

## 2014-11-10 MED ORDER — HYDROCOD POLST-CPM POLST ER 10-8 MG/5ML PO SUER: 5.0000 mL | Freq: Two times a day (BID) | ORAL | Status: DC

## 2014-11-10 MED ORDER — FLUCONAZOLE 150 MG PO TABS: 150.0000 mg | ORAL_TABLET | Freq: Once | ORAL | Status: DC

## 2014-11-10 MED ORDER — PENICILLIN V POTASSIUM 500 MG PO TABS: 500.0000 mg | ORAL_TABLET | Freq: Four times a day (QID) | ORAL | Status: DC

## 2014-11-10 NOTE — Patient Instructions (Signed)

## 2014-11-10 NOTE — Progress Notes (Signed)
Subjective:  Patient ID: Christine Valdez, female    DOB: 01-Apr-1951  Age: 64 y.o. MRN: [id number]  CC: Sore Throat; Laryngitis; and Shortness of Breath   HPI Christine Valdez presents  with sore throat. She took care of her granddaughter who was just diagnosed with strep and hand-foot-and-mouth disease. She has marked sore throat or hoarseness associated with some cough. She has no fever chills nausea vomiting. She has fatigue and and feels "miserable" no nausea vomiting or stool change no rash. She has no intraoral lesions. Said no improvement with over-the-counter medication  History Christine Valdez has a past medical history of Allergy; Hypothyroid; Sjogren's syndrome; Cancer (2007); Insomnia; Depression; Hypertension; Spinal headache; Asthma; History of bronchitis; Arthritis; Joint pain; Osteoarthritis; Joint swelling; History of colon polyps; Interstitial cystitis; Urinary urgency; Anemia; and History of shingles.   She has past surgical history that includes Cesarean section (1981/1984); Tonsillectomy and adenoidectomy (age 77); Carpal tunnel release (Right, 05/2007); Colonoscopy; Total shoulder arthroplasty (Right, 07/04/2013); and Total shoulder arthroplasty (Right, 07/04/2013).   Her  family history includes Breast cancer (age of onset: 50) in her sister; Diabetes in her brother and father; Epilepsy in her daughter; Hypertension in her father and mother; Osteoporosis in her mother.  She   reports that she has never smoked. She has never used smokeless tobacco. She reports that she drinks alcohol. She reports that she does not use illicit drugs.  Outpatient Prescriptions Prior to Visit  Medication Sig Dispense Refill  . albuterol (PROVENTIL HFA;VENTOLIN HFA) 108 (90 BASE) MCG/ACT inhaler Inhale 2 puffs into the lungs every 6 (six) hours as needed for shortness of breath.     . cholecalciferol (VITAMIN D) 1000 UNITS tablet Take 1,000 Units by mouth daily.    . citalopram (CELEXA) 40 MG  tablet Take 40 mg by mouth daily.    Marland Kitchen docusate sodium (COLACE) 100 MG capsule Take 400 mg by mouth at bedtime.     . fluticasone (FLONASE) 50 MCG/ACT nasal spray Place 2 sprays into both nostrils daily as needed for allergies.    . Ketotifen Fumarate (ALAWAY OP) Place 1 drop into both eyes daily as needed (for allergies).    Marland Kitchen levothyroxine (SYNTHROID, LEVOTHROID) 75 MCG tablet Take 75 mcg by mouth daily.    Marland Kitchen loratadine (CLARITIN) 10 MG tablet Take 10 mg by mouth daily as needed for allergies.     . Multiple Vitamin (MULTIVITAMIN) tablet Take 1 tablet by mouth daily.    . Omega-3 Fatty Acids (FISH OIL) 1000 MG CAPS Take 1 capsule by mouth daily.    . temazepam (RESTORIL) 30 MG capsule Take 1 capsule (30 mg total) by mouth at bedtime as needed for sleep. (Patient not taking: Reported on 11/10/2014) 30 capsule 1  . triamterene-hydrochlorothiazide (MAXZIDE) 75-50 MG per tablet Take 0.5 tablets by mouth daily.     . fluticasone (FLONASE) 50 MCG/ACT nasal spray Place 2 sprays into the nose as needed.     No facility-administered medications prior to visit.    History   Social History  . Marital Status: Married    Spouse Name: N/A  . Number of Children: 2  . Years of Education: N/A   Social History Main Topics  . Smoking status: Never Smoker   . Smokeless tobacco: Never Used  . Alcohol Use: 0.0 oz/week    0 Standard drinks or equivalent per week     Comment: occ wine  . Drug Use: No  . Sexual Activity:  Partners: Male    Patent examiner Protection: Post-menopausal   Other Topics Concern  . None   Social History Narrative     Review of Systems  Constitutional: Negative for fever, chills and appetite change.  HENT: Positive for sore throat. Negative for congestion, ear pain, mouth sores, postnasal drip and sinus pressure.   Eyes: Negative for pain and redness.  Respiratory: Negative for cough, shortness of breath and wheezing.   Cardiovascular: Negative for leg swelling.    Gastrointestinal: Negative for nausea, vomiting, abdominal pain, diarrhea, constipation and blood in stool.  Endocrine: Negative for polyuria.  Genitourinary: Negative for dysuria, urgency, frequency and flank pain.  Musculoskeletal: Negative for gait problem.  Skin: Negative for rash.  Neurological: Negative for weakness and headaches.  Psychiatric/Behavioral: Negative for confusion and decreased concentration. The patient is not nervous/anxious.     Objective:  BP 118/68 mmHg  Pulse 105  Temp(Src) 99.1 F (37.3 C) (Oral)  Resp 18  Ht 5' 3.5" (1.613 m)  Wt 163 lb (73.936 kg)  BMI 28.42 kg/m2  SpO2 95%  LMP 04/11/2003 (Approximate)  Physical Exam  Constitutional: She is oriented to person, place, and time. She appears well-developed and well-nourished. No distress.  HENT:  Head: Normocephalic and atraumatic.  Right Ear: External ear normal.  Left Ear: External ear normal.  Nose: Nose normal.  Mouth/Throat: Posterior oropharyngeal erythema present. No oropharyngeal exudate.  Eyes: Conjunctivae and EOM are normal. Pupils are equal, round, and reactive to light. No scleral icterus.  Neck: Normal range of motion. Neck supple. No tracheal deviation present.  Cardiovascular: Normal rate, regular rhythm and normal heart sounds.   Pulmonary/Chest: Effort normal. No respiratory distress. She has no wheezes. She has no rales.  Abdominal: She exhibits no mass. There is no tenderness. There is no rebound and no guarding.  Musculoskeletal: She exhibits no edema.  Lymphadenopathy:    She has no cervical adenopathy.  Neurological: She is alert and oriented to person, place, and time. Coordination normal.  Skin: Skin is warm and dry. No rash noted.  Psychiatric: She has a normal mood and affect. Her behavior is normal.      Assessment & Plan:   Christine Valdez was seen today for sore throat, laryngitis and shortness of breath.  Diagnoses and all orders for this visit:  Streptococcal sore  throat  Acute bronchitis, unspecified organism  Other orders -     penicillin v potassium (VEETID) 500 MG tablet; Take 1 tablet (500 mg total) by mouth 4 (four) times daily. -     fluconazole (DIFLUCAN) 150 MG tablet; Take 1 tablet (150 mg total) by mouth once. Repeat if needed -     chlorpheniramine-HYDROcodone (TUSSIONEX PENNKINETIC ER) 10-8 MG/5ML SUER; Take 5 mLs by mouth 2 (two) times daily.   I am having Christine Valdez start on penicillin v potassium, fluconazole, and chlorpheniramine-HYDROcodone. I am also having her maintain her triamterene-hydrochlorothiazide, levothyroxine, citalopram, albuterol, multivitamin, loratadine, temazepam, docusate sodium, cholecalciferol, fluticasone, Ketotifen Fumarate (ALAWAY OP), and Fish Oil.  Meds ordered this encounter  Medications  . penicillin v potassium (VEETID) 500 MG tablet    Sig: Take 1 tablet (500 mg total) by mouth 4 (four) times daily.    Dispense:  40 tablet    Refill:  0  . fluconazole (DIFLUCAN) 150 MG tablet    Sig: Take 1 tablet (150 mg total) by mouth once. Repeat if needed    Dispense:  2 tablet    Refill:  0  . chlorpheniramine-HYDROcodone (  TUSSIONEX PENNKINETIC ER) 10-8 MG/5ML SUER    Sig: Take 5 mLs by mouth 2 (two) times daily.    Dispense:  60 mL    Refill:  0    Appropriate red flag conditions were discussed with the patient as well as actions that should be taken.  Patient expressed his understanding.  Follow-up: Return if symptoms worsen or fail to improve.  Roselee Culver, MD

## 2014-12-10 ENCOUNTER — Other Ambulatory Visit: Payer: Self-pay

## 2014-12-10 ENCOUNTER — Telehealth: Payer: Self-pay | Admitting: Obstetrics & Gynecology

## 2014-12-10 DIAGNOSIS — Z1231 Encounter for screening mammogram for malignant neoplasm of breast: Secondary | ICD-10-CM

## 2014-12-10 NOTE — Telephone Encounter (Signed)
Patient calling requesting to speak with the nurse about anxiety. She is requesting a prescription to help her. Pharmacy on file is correct.

## 2014-12-10 NOTE — Telephone Encounter (Signed)
Spoke with patient. Patient states that she is going through a really tough time with having to put her dog down recently and having a family member admitted to the hospital. States she is having trouble getting through the day without being anxious. Is currently taking Citalopram 40 mg daily. "I have been on that for years. Mainly for depression but I have not noticed it helping with my anxiety at all." Requesting to be placed on a new medication for anxiety in addition to her Citalopram or to increase her dosage of her Citalopram. Patient is scheduled for aex on 04/08/2015 with Dr.Miller. Prefers I speak with Dr.Miller as this is who she has seen for many years previously. Advised I will speak with Dr.Miller and return call. Patient is agreeable.

## 2014-12-10 NOTE — Telephone Encounter (Signed)
OK to add wellbutrin 150XL daily.  Follow-up 4 weeks to see how helping.

## 2014-12-11 MED ORDER — BUPROPION HCL ER (XL) 150 MG PO TB24: 150.0000 mg | ORAL_TABLET | Freq: Every day | ORAL | Status: DC

## 2014-12-11 NOTE — Telephone Encounter (Signed)
Left message to call Kaitlyn at [phone]. 

## 2014-12-11 NOTE — Telephone Encounter (Signed)
Spoke with patient. Advised of message as seen below from [place]. Patient is agreeable. Rx for Wellbutrin 150 XL #30 [id number] sent to pharmacy on file. Patient is agreeable and will follow up in 4 weeks.  Routing to provider for final review. Patient agreeable to disposition. Will close encounter.

## 2014-12-11 NOTE — Telephone Encounter (Signed)
Returned call

## 2014-12-31 ENCOUNTER — Telehealth: Payer: Self-pay | Admitting: Obstetrics & Gynecology

## 2014-12-31 NOTE — Telephone Encounter (Signed)
Spoke with patient. Advised patient we recommend Dr.Lomax at [facility] dermatology or Dr.Gruber, Dr.Gould, or Dr.Haverstock at [facility]. Patient is agreeable and will call to schedule an appointment.  Routing to provider for final review. Patient agreeable to disposition. Will close encounter.

## 2014-12-31 NOTE — Telephone Encounter (Signed)
Patient asking for Dr. Ammie Ferrier recommendation for a female dermatologist. Best contact #: [phone] and you can leave a detailed message.

## 2015-01-12 ENCOUNTER — Other Ambulatory Visit: Payer: Self-pay | Admitting: Obstetrics & Gynecology

## 2015-01-12 NOTE — Telephone Encounter (Signed)
Patient is calling to give an update on a medication she has been taking. She states it is working good and would like to get a refill please. She is using [facility] [address].

## 2015-01-12 NOTE — Telephone Encounter (Signed)
Dr. Talbert Nan,  Okay to place refill for Wellbutrin 150 XL for patient until annual exam scheduled with Dr. Sabra Heck 04/08/15.  Patient started Wellbutrin one month ago and called to update as below.

## 2015-01-12 NOTE — Telephone Encounter (Signed)
Patient notified refill was approved until her annual exam with Dr. Sabra Heck.  Routing to provider for final review. Patient agreeable to disposition. Will close encounter.

## 2015-01-14 ENCOUNTER — Ambulatory Visit
Admission: RE | Admit: 2015-01-14 | Discharge: 2015-01-14 | Disposition: A | Discharge status: Home / Self Care | Payer: BLUE CROSS/BLUE SHIELD | Source: Ambulatory Visit

## 2015-01-14 DIAGNOSIS — Z1231 Encounter for screening mammogram for malignant neoplasm of breast: Secondary | ICD-10-CM

## 2015-02-04 DIAGNOSIS — F419 Anxiety disorder, unspecified: Secondary | ICD-10-CM | POA: Insufficient documentation

## 2015-02-04 DIAGNOSIS — F339 Major depressive disorder, recurrent, unspecified: Secondary | ICD-10-CM | POA: Insufficient documentation

## 2015-02-25 NOTE — H&P (Signed)
THERESEA Valdez is an 64 y.o. female.    Chief Complaint: left shoulder pain  HPI: Pt is a 64 y.o. female complaining of left shoulder pain for multiple years. Pain had continually increased since the beginning. X-rays in the clinic show end-stage arthritic changes of the left shoulder. Pt has tried various conservative treatments which have failed to alleviate their symptoms, including injections and therapy. Various options are discussed with the patient. Risks, benefits and expectations were discussed with the patient. Patient understand the risks, benefits and expectations and wishes to proceed with surgery.   PCP:  Luetta Nutting, DO  D/C Plans: Home  PMH: Past Medical History  Diagnosis Date  . Allergy     uses Flonase daily as needed and takes Loratadine daily   . Hypothyroid   . Sjogren's syndrome   . 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 Insomnia     takes Restoril nightly as needed  . Depression     takes Celexa daily  . Hypertension     takes Maxzide daily   . Spinal headache     after 1st C/S  . Asthma     uses Albuterol daily as needed;exercise induced  . History of bronchitis     couple of months ago was the last time  . Arthritis   . Joint pain   . Osteoarthritis   . Joint swelling   . History of colon polyps   . Interstitial cystitis   . Urinary urgency   . Anemia     only after birth of 2nd child  . History of shingles     PSH: Past Surgical History  Procedure Laterality Date  . Cesarean section  1981/1984    x 2  . Tonsillectomy and adenoidectomy  age 41  . Carpal tunnel release Right 05/2007  . Colonoscopy    . Total shoulder arthroplasty Right 07/04/2013    DR NORRIS  . Total shoulder arthroplasty Right 07/04/2013    Procedure: RIGHT TOTAL SHOULDER ARTHROPLASTY;  Surgeon: Augustin Schooling, MD;  Location: [place];  Service: Orthopedics;  Laterality: Right;    Social History:  reports that she has never smoked. She has never used  smokeless tobacco. She reports that she drinks alcohol. She reports that she does not use illicit drugs.  Allergies:  Allergies  Allergen Reactions  . Thimerosal Hives  . Diflucan [Fluconazole]   . Mercury Detox [Nutritional Supplements] Hives  . Influenza Vaccine Recombinant Rash    Mercury component in flu vaccine  . Levaquin [Levofloxacin] Rash  . Sulfa Antibiotics Rash    Medications: No current facility-administered medications for this encounter.   Current Outpatient Prescriptions  Medication Sig Dispense Refill  . albuterol (PROVENTIL HFA;VENTOLIN HFA) 108 (90 BASE) MCG/ACT inhaler Inhale 2 puffs into the lungs every 6 (six) hours as needed for shortness of breath.     Marland Kitchen buPROPion (WELLBUTRIN XL) 150 MG 24 hr tablet TAKE 1 TABLET (150 MG TOTAL) BY MOUTH DAILY. 30 tablet 3  . chlorpheniramine-HYDROcodone (TUSSIONEX PENNKINETIC ER) 10-8 MG/5ML SUER Take 5 mLs by mouth 2 (two) times daily. 60 mL 0  . cholecalciferol (VITAMIN D) 1000 UNITS tablet Take 1,000 Units by mouth daily.    . citalopram (CELEXA) 40 MG tablet Take 40 mg by mouth daily.    Marland Kitchen docusate sodium (COLACE) 100 MG capsule Take 400 mg by mouth at bedtime.     . fluconazole (DIFLUCAN) 150 MG tablet Take 1 tablet (  150 mg total) by mouth once. Repeat if needed 2 tablet 0  . fluticasone (FLONASE) 50 MCG/ACT nasal spray Place 2 sprays into both nostrils daily as needed for allergies.    . Ketotifen Fumarate (ALAWAY OP) Place 1 drop into both eyes daily as needed (for allergies).    Marland Kitchen levothyroxine (SYNTHROID, LEVOTHROID) 75 MCG tablet Take 75 mcg by mouth daily.    Marland Kitchen loratadine (CLARITIN) 10 MG tablet Take 10 mg by mouth daily as needed for allergies.     . Multiple Vitamin (MULTIVITAMIN) tablet Take 1 tablet by mouth daily.    . Omega-3 Fatty Acids (FISH OIL) 1000 MG CAPS Take 1 capsule by mouth daily.    . penicillin v potassium (VEETID) 500 MG tablet Take 1 tablet (500 mg total) by mouth 4 (four) times daily. 40 tablet  0  . temazepam (RESTORIL) 30 MG capsule Take 1 capsule (30 mg total) by mouth at bedtime as needed for sleep. (Patient not taking: Reported on 11/10/2014) 30 capsule 1  . triamterene-hydrochlorothiazide (MAXZIDE) 75-50 MG per tablet Take 0.5 tablets by mouth daily.       No results found for this or any previous visit (from the past 48 hour(s)). No results found.  ROS: Pain with rom of the left upper extremity  Physical Exam:  Alert and oriented 64 y.o. female in no acute distress Cranial nerves 2-12 intact Cervical spine: full rom with no tenderness, nv intact distally Chest: active breath sounds bilaterally, no wheeze rhonchi or rales Heart: regular rate and rhythm, no murmur Abd: non tender non distended with active bowel sounds Hip is stable with rom  Left shoulder with moderate decrease rom due to pain and crepitus nv intact distally Strength of ER and IR 4/5  No rashes or edema  Assessment/Plan Assessment: left shoulder end stage osteoarthritis  Plan: Patient will undergo a left total shoulder vs reverse total shoulder by Dr. Veverly Fells at [facility]. Risks benefits and expectations were discussed with the patient. Patient understand risks, benefits and expectations and wishes to proceed.

## 2015-03-02 ENCOUNTER — Encounter (HOSPITAL_COMMUNITY)
Admission: RE | Admit: 2015-03-02 | Discharge: 2015-03-02 | Disposition: A | Discharge status: Home / Self Care | Payer: BLUE CROSS/BLUE SHIELD | Source: Ambulatory Visit | Attending: Orthopedic Surgery | Admitting: Orthopedic Surgery

## 2015-03-02 ENCOUNTER — Encounter (HOSPITAL_COMMUNITY): Payer: Self-pay

## 2015-03-02 DIAGNOSIS — Z01812 Encounter for preprocedural laboratory examination: Secondary | ICD-10-CM | POA: Insufficient documentation

## 2015-03-02 DIAGNOSIS — M19012 Primary osteoarthritis, left shoulder: Secondary | ICD-10-CM | POA: Diagnosis not present

## 2015-03-02 HISTORY — DX: Gastro-esophageal reflux disease without esophagitis: K21.9

## 2015-03-02 LAB — BASIC METABOLIC PANEL
ANION GAP: 5 (ref 5–15)
BUN: 14 mg/dL (ref 6–20)
CALCIUM: 9.9 mg/dL (ref 8.9–10.3)
CO2: 28 mmol/L (ref 22–32)
Chloride: 106 mmol/L (ref 101–111)
Creatinine, Ser: 0.98 mg/dL (ref 0.44–1.00)
Glucose, Bld: 130 mg/dL — ABNORMAL HIGH (ref 65–99)
Potassium: 4.2 mmol/L (ref 3.5–5.1)
SODIUM: 139 mmol/L (ref 135–145)

## 2015-03-02 LAB — SURGICAL PCR SCREEN
MRSA, PCR: NEGATIVE
Staphylococcus aureus: NEGATIVE

## 2015-03-02 LAB — CBC
HCT: 39.4 % (ref 36.0–46.0)
Hemoglobin: 13.2 g/dL (ref 12.0–15.0)
MCH: 30.3 pg (ref 26.0–34.0)
MCHC: 33.5 g/dL (ref 30.0–36.0)
MCV: 90.4 fL (ref 78.0–100.0)
PLATELETS: 143 10*3/uL — AB (ref 150–400)
RBC: 4.36 MIL/uL (ref 3.87–5.11)
RDW: 12.4 % (ref 11.5–15.5)
WBC: 5.4 10*3/uL (ref 4.0–10.5)

## 2015-03-02 NOTE — Pre-Procedure Instructions (Addendum)
DYNETTE MAGINNIS  03/02/2015      [facility] - [place], [place] - [address] AT [facility] [address] [place] [address] Phone: [phone] Fax: [phone]  [id number] [address] [place] [address] Phone: [phone] Fax: [phone]    Your procedure is scheduled on 03/12/15  Report to [facility] short stay admitting at 1110 A.M.  Call this number if you have problems the morning of surgery:  [phone]   Remember:  Do not eat food or drink liquids after midnight.  Take these medicines the morning of surgery with A SIP OF WATER albuterol if needed(bring with you), levothyroxine    STOP all herbel meds, nsaids (aleve,naproxen,advil,ibuprofen) 5 days prior to surgery starting 03/07/15 including vitamins(vit D, multi) fish oil, ketotifrn eye drops   Do not wear jewelry, make-up or nail polish.  Do not wear lotions, powders, or perfumes.  You may wear deodorant.  Do not shave 48 hours prior to surgery.  Men may shave face and neck.  Do not bring valuables to the hospital.  [facility] is not responsible for any belongings or valuables.  Contacts, dentures or bridgework may not be worn into surgery.  Leave your suitcase in the car.  After surgery it may be brought to your room.  For patients admitted to the hospital, discharge time will be determined by your treatment team.  Patients discharged the day of surgery will not be allowed to drive home.   Name and phone number of your driver:    Special instructions:   Special Instructions:  - Preparing for Surgery  Before surgery, you can play an important role.  Because skin is not sterile, your skin needs to be as free of germs as possible.  You can reduce the number of germs on you skin by washing with CHG (chlorahexidine gluconate) soap before surgery.  CHG is an antiseptic cleaner which kills germs and bonds  with the skin to continue killing germs even after washing.  Please DO NOT use if you have an allergy to CHG or antibacterial soaps.  If your skin becomes reddened/irritated stop using the CHG and inform your nurse when you arrive at Short Stay.  Do not shave (including legs and underarms) for at least 48 hours prior to the first CHG shower.  You may shave your face.  Please follow these instructions carefully:   1.  Shower with CHG Soap the night before surgery and the morning of Surgery.  2.  If you choose to wash your hair, wash your hair first as usual with your normal shampoo.  3.  After you shampoo, rinse your hair and body thoroughly to remove the Shampoo.  4.  Use CHG as you would any other liquid soap.  You can apply chg directly  to the skin and wash gently with scrungie or a clean washcloth.  5.  Apply the CHG Soap to your body ONLY FROM THE NECK DOWN.  Do not use on open wounds or open sores.  Avoid contact with your eyes ears, mouth and genitals (private parts).  Wash genitals (private parts)       with your normal soap.  6.  Wash thoroughly, paying special attention to the area where your surgery will be performed.  7.  Thoroughly rinse your body with warm water from the neck down.  8.  DO NOT shower/wash  with your normal soap after using and rinsing off the CHG Soap.  9.  Pat yourself dry with a clean towel.            10.  Wear clean pajamas.            11.  Place clean sheets on your bed the night of your first shower and do not sleep with pets.  Day of Surgery  Do not apply any lotions/deodorants the morning of surgery.  Please wear clean clothes to the hospital/surgery center.  Please read over the following fact sheets that you were given. Pain Booklet, Coughing and Deep Breathing, MRSA Information and Surgical Site Infection Prevention

## 2015-03-02 NOTE — Progress Notes (Signed)
req'd ekg from dr cody Zigmund Daniel pcp [facility] [phone]

## 2015-03-08 NOTE — Progress Notes (Signed)
Called Dr Zigmund Daniel office requested EKG, office staff states they will send it over.

## 2015-03-11 MED ORDER — CEFAZOLIN SODIUM-DEXTROSE 2-3 GM-% IV SOLR
2.0000 g | INTRAVENOUS | Status: AC
  Administered 2015-03-12: 2 g via INTRAVENOUS
  Filled 2015-03-11: qty 50

## 2015-03-11 MED ORDER — CHLORHEXIDINE GLUCONATE 4 % EX LIQD: 60.0000 mL | Freq: Once | CUTANEOUS | Status: DC

## 2015-03-12 ENCOUNTER — Encounter (HOSPITAL_COMMUNITY)
Admission: RE | Disposition: A | Discharge status: Home / Self Care | Payer: Self-pay | Source: Ambulatory Visit | Attending: Orthopedic Surgery

## 2015-03-12 ENCOUNTER — Inpatient Hospital Stay (HOSPITAL_COMMUNITY): Payer: BLUE CROSS/BLUE SHIELD | Admitting: Certified Registered Nurse Anesthetist

## 2015-03-12 ENCOUNTER — Inpatient Hospital Stay (HOSPITAL_COMMUNITY)
Admission: RE | Admit: 2015-03-12 | Discharge: 2015-03-14 | DRG: 483 | Disposition: A | Discharge status: Home / Self Care | Payer: BLUE CROSS/BLUE SHIELD | Source: Ambulatory Visit | Attending: Orthopedic Surgery | Admitting: Orthopedic Surgery

## 2015-03-12 ENCOUNTER — Encounter (HOSPITAL_COMMUNITY): Payer: Self-pay | Admitting: Certified Registered Nurse Anesthetist

## 2015-03-12 ENCOUNTER — Inpatient Hospital Stay (HOSPITAL_COMMUNITY): Payer: BLUE CROSS/BLUE SHIELD

## 2015-03-12 DIAGNOSIS — F329 Major depressive disorder, single episode, unspecified: Secondary | ICD-10-CM | POA: Diagnosis present

## 2015-03-12 DIAGNOSIS — J45909 Unspecified asthma, uncomplicated: Secondary | ICD-10-CM | POA: Diagnosis present

## 2015-03-12 DIAGNOSIS — M19012 Primary osteoarthritis, left shoulder: Principal | ICD-10-CM | POA: Diagnosis present

## 2015-03-12 DIAGNOSIS — M3500 Sicca syndrome, unspecified: Secondary | ICD-10-CM | POA: Diagnosis present

## 2015-03-12 DIAGNOSIS — E039 Hypothyroidism, unspecified: Secondary | ICD-10-CM | POA: Diagnosis present

## 2015-03-12 DIAGNOSIS — G4700 Insomnia, unspecified: Secondary | ICD-10-CM | POA: Diagnosis present

## 2015-03-12 DIAGNOSIS — I10 Essential (primary) hypertension: Secondary | ICD-10-CM | POA: Diagnosis present

## 2015-03-12 DIAGNOSIS — Z96612 Presence of left artificial shoulder joint: Secondary | ICD-10-CM

## 2015-03-12 DIAGNOSIS — Z96611 Presence of right artificial shoulder joint: Secondary | ICD-10-CM | POA: Diagnosis present

## 2015-03-12 DIAGNOSIS — Z96619 Presence of unspecified artificial shoulder joint: Secondary | ICD-10-CM

## 2015-03-12 DIAGNOSIS — M25512 Pain in left shoulder: Secondary | ICD-10-CM | POA: Diagnosis present

## 2015-03-12 HISTORY — PX: TOTAL SHOULDER ARTHROPLASTY: SHX126

## 2015-03-12 SURGERY — ARTHROPLASTY, SHOULDER, TOTAL
Anesthesia: Regional | Site: Shoulder | Laterality: Left

## 2015-03-12 MED ORDER — LACTATED RINGERS IV SOLN
INTRAVENOUS | Status: DC
  Administered 2015-03-12 (×3): via INTRAVENOUS

## 2015-03-12 MED ORDER — CITALOPRAM HYDROBROMIDE 40 MG PO TABS
40.0000 mg | ORAL_TABLET | Freq: Every day | ORAL | Status: DC
  Administered 2015-03-12 – 2015-03-14 (×3): 40 mg via ORAL
  Filled 2015-03-12 (×3): qty 1

## 2015-03-12 MED ORDER — KETOTIFEN FUMARATE 0.025 % OP SOLN
1.0000 [drp] | Freq: Every day | OPHTHALMIC | Status: DC | PRN
  Filled 2015-03-12: qty 5

## 2015-03-12 MED ORDER — PROPOFOL 10 MG/ML IV BOLUS
INTRAVENOUS | Status: DC | PRN
  Administered 2015-03-12: 160 mg via INTRAVENOUS

## 2015-03-12 MED ORDER — FLUTICASONE PROPIONATE 50 MCG/ACT NA SUSP
2.0000 | Freq: Every day | NASAL | Status: DC | PRN
  Filled 2015-03-12: qty 16

## 2015-03-12 MED ORDER — DOCUSATE SODIUM 100 MG PO CAPS: 400.0000 mg | ORAL_CAPSULE | Freq: Every day | ORAL | Status: DC

## 2015-03-12 MED ORDER — ADULT MULTIVITAMIN W/MINERALS CH
1.0000 | ORAL_TABLET | Freq: Every day | ORAL | Status: DC
  Filled 2015-03-12 (×4): qty 1

## 2015-03-12 MED ORDER — LIDOCAINE HCL 4 % MT SOLN
OROMUCOSAL | Status: DC | PRN
  Administered 2015-03-12: 4 mL via TOPICAL

## 2015-03-12 MED ORDER — BISACODYL 10 MG RE SUPP: 10.0000 mg | Freq: Every day | RECTAL | Status: DC | PRN

## 2015-03-12 MED ORDER — ACETAMINOPHEN 650 MG RE SUPP: 650.0000 mg | Freq: Four times a day (QID) | RECTAL | Status: DC | PRN

## 2015-03-12 MED ORDER — FENTANYL CITRATE (PF) 100 MCG/2ML IJ SOLN
INTRAMUSCULAR | Status: DC | PRN
  Administered 2015-03-12: 100 ug via INTRAVENOUS

## 2015-03-12 MED ORDER — BUPIVACAINE-EPINEPHRINE (PF) 0.25% -1:200000 IJ SOLN
INTRAMUSCULAR | Status: AC
  Filled 2015-03-12: qty 30

## 2015-03-12 MED ORDER — HYDROCODONE-ACETAMINOPHEN 5-325 MG PO TABS: 1.0000 | ORAL_TABLET | Freq: Four times a day (QID) | ORAL | Status: DC | PRN

## 2015-03-12 MED ORDER — THROMBIN 5000 UNITS EX SOLR
CUTANEOUS | Status: DC | PRN
  Administered 2015-03-12: 5000 [IU] via TOPICAL

## 2015-03-12 MED ORDER — PHENOL 1.4 % MT LIQD: 1.0000 | OROMUCOSAL | Status: DC | PRN

## 2015-03-12 MED ORDER — MENTHOL 3 MG MT LOZG
1.0000 | LOZENGE | OROMUCOSAL | Status: DC | PRN
  Administered 2015-03-12: 3 mg via ORAL
  Filled 2015-03-12 (×3): qty 9

## 2015-03-12 MED ORDER — HYDROCODONE-ACETAMINOPHEN 5-325 MG PO TABS
1.0000 | ORAL_TABLET | ORAL | Status: DC | PRN
  Administered 2015-03-12 – 2015-03-13 (×4): 2 via ORAL
  Administered 2015-03-13: 1 via ORAL
  Administered 2015-03-13 – 2015-03-14 (×2): 2 via ORAL
  Filled 2015-03-12 (×8): qty 2

## 2015-03-12 MED ORDER — THROMBIN 5000 UNITS EX SOLR
CUTANEOUS | Status: AC
  Filled 2015-03-12: qty 5000

## 2015-03-12 MED ORDER — DEXAMETHASONE SODIUM PHOSPHATE 10 MG/ML IJ SOLN
INTRAMUSCULAR | Status: DC | PRN
  Administered 2015-03-12: 10 mg via INTRAVENOUS

## 2015-03-12 MED ORDER — METHOCARBAMOL 500 MG PO TABS: 500.0000 mg | ORAL_TABLET | Freq: Three times a day (TID) | ORAL | Status: DC | PRN

## 2015-03-12 MED ORDER — METHOCARBAMOL 500 MG PO TABS
500.0000 mg | ORAL_TABLET | Freq: Four times a day (QID) | ORAL | Status: DC | PRN
  Administered 2015-03-12 – 2015-03-14 (×7): 500 mg via ORAL
  Filled 2015-03-12 (×8): qty 1

## 2015-03-12 MED ORDER — POLYETHYLENE GLYCOL 3350 17 G PO PACK
17.0000 g | PACK | Freq: Every day | ORAL | Status: DC | PRN
Start: 2015-03-12 — End: 2015-03-14

## 2015-03-12 MED ORDER — OMEGA-3-ACID ETHYL ESTERS 1 G PO CAPS
2.0000 g | ORAL_CAPSULE | Freq: Every day | ORAL | Status: DC
  Filled 2015-03-12 (×2): qty 2

## 2015-03-12 MED ORDER — METOCLOPRAMIDE HCL 5 MG/ML IJ SOLN: 5.0000 mg | Freq: Three times a day (TID) | INTRAMUSCULAR | Status: DC | PRN

## 2015-03-12 MED ORDER — BUPIVACAINE-EPINEPHRINE 0.25% -1:200000 IJ SOLN
INTRAMUSCULAR | Status: DC | PRN
  Administered 2015-03-12: 8 mL

## 2015-03-12 MED ORDER — VITAMIN D 1000 UNITS PO TABS
1000.0000 [IU] | ORAL_TABLET | Freq: Every day | ORAL | Status: DC
  Administered 2015-03-12 – 2015-03-14 (×3): 1000 [IU] via ORAL
  Filled 2015-03-12 (×3): qty 1

## 2015-03-12 MED ORDER — MIDAZOLAM HCL 2 MG/2ML IJ SOLN
INTRAMUSCULAR | Status: AC
  Filled 2015-03-12: qty 2

## 2015-03-12 MED ORDER — ALBUTEROL SULFATE (2.5 MG/3ML) 0.083% IN NEBU: 3.0000 mL | INHALATION_SOLUTION | Freq: Four times a day (QID) | RESPIRATORY_TRACT | Status: DC | PRN

## 2015-03-12 MED ORDER — TEMAZEPAM 15 MG PO CAPS
30.0000 mg | ORAL_CAPSULE | Freq: Every evening | ORAL | Status: DC | PRN
Start: 2015-03-12 — End: 2015-03-14
  Administered 2015-03-13: 30 mg via ORAL
  Filled 2015-03-12: qty 2

## 2015-03-12 MED ORDER — MIDAZOLAM HCL 2 MG/2ML IJ SOLN
INTRAMUSCULAR | Status: AC
  Administered 2015-03-12: 2 mg
  Filled 2015-03-12: qty 2

## 2015-03-12 MED ORDER — DOCUSATE SODIUM 100 MG PO CAPS
100.0000 mg | ORAL_CAPSULE | Freq: Two times a day (BID) | ORAL | Status: DC
  Administered 2015-03-12 – 2015-03-14 (×4): 100 mg via ORAL
  Filled 2015-03-12 (×4): qty 1

## 2015-03-12 MED ORDER — DEXAMETHASONE SODIUM PHOSPHATE 10 MG/ML IJ SOLN
INTRAMUSCULAR | Status: AC
  Filled 2015-03-12: qty 2

## 2015-03-12 MED ORDER — LIDOCAINE HCL (CARDIAC) 20 MG/ML IV SOLN
INTRAVENOUS | Status: DC | PRN
  Administered 2015-03-12: 60 mg via INTRAVENOUS

## 2015-03-12 MED ORDER — SODIUM CHLORIDE 0.9 % IV SOLN
INTRAVENOUS | Status: DC
  Administered 2015-03-13: 01:00:00 via INTRAVENOUS

## 2015-03-12 MED ORDER — LEVOTHYROXINE SODIUM 75 MCG PO TABS
75.0000 ug | ORAL_TABLET | Freq: Every day | ORAL | Status: DC
  Administered 2015-03-13 – 2015-03-14 (×2): 75 ug via ORAL
  Filled 2015-03-12 (×2): qty 1

## 2015-03-12 MED ORDER — ROCURONIUM BROMIDE 100 MG/10ML IV SOLN
INTRAVENOUS | Status: DC | PRN
  Administered 2015-03-12: 40 mg via INTRAVENOUS

## 2015-03-12 MED ORDER — ONDANSETRON HCL 4 MG/2ML IJ SOLN
4.0000 mg | Freq: Four times a day (QID) | INTRAMUSCULAR | Status: DC | PRN
  Administered 2015-03-13: 4 mg via INTRAVENOUS
  Filled 2015-03-12: qty 2

## 2015-03-12 MED ORDER — PHENYLEPHRINE HCL 10 MG/ML IJ SOLN
10.0000 mg | INTRAVENOUS | Status: DC | PRN
  Administered 2015-03-12: 10 ug/min via INTRAVENOUS

## 2015-03-12 MED ORDER — LORATADINE 10 MG PO TABS
10.0000 mg | ORAL_TABLET | Freq: Every day | ORAL | Status: DC | PRN
  Administered 2015-03-13 – 2015-03-14 (×2): 10 mg via ORAL
  Filled 2015-03-12 (×2): qty 1

## 2015-03-12 MED ORDER — ARTIFICIAL TEARS OP OINT
TOPICAL_OINTMENT | OPHTHALMIC | Status: AC
  Filled 2015-03-12: qty 3.5

## 2015-03-12 MED ORDER — FLUCONAZOLE 150 MG PO TABS: 150.0000 mg | ORAL_TABLET | Freq: Once | ORAL | Status: DC

## 2015-03-12 MED ORDER — TRAMADOL-ACETAMINOPHEN 37.5-325 MG PO TABS
1.0000 | ORAL_TABLET | Freq: Four times a day (QID) | ORAL | Status: DC | PRN
  Administered 2015-03-13 – 2015-03-14 (×4): 1 via ORAL
  Filled 2015-03-12 (×4): qty 1

## 2015-03-12 MED ORDER — OXYCODONE HCL 5 MG/5ML PO SOLN: 5.0000 mg | Freq: Once | ORAL | Status: DC | PRN

## 2015-03-12 MED ORDER — METOCLOPRAMIDE HCL 5 MG PO TABS: 5.0000 mg | ORAL_TABLET | Freq: Three times a day (TID) | ORAL | Status: DC | PRN

## 2015-03-12 MED ORDER — METHOCARBAMOL 1000 MG/10ML IJ SOLN
500.0000 mg | Freq: Four times a day (QID) | INTRAVENOUS | Status: DC | PRN
  Filled 2015-03-12: qty 5

## 2015-03-12 MED ORDER — ONDANSETRON HCL 4 MG PO TABS: 4.0000 mg | ORAL_TABLET | Freq: Four times a day (QID) | ORAL | Status: DC | PRN

## 2015-03-12 MED ORDER — FENTANYL CITRATE (PF) 250 MCG/5ML IJ SOLN
INTRAMUSCULAR | Status: AC
  Filled 2015-03-12: qty 5

## 2015-03-12 MED ORDER — SODIUM CHLORIDE 0.9 % IR SOLN
Status: DC | PRN
  Administered 2015-03-12: 1000 mL

## 2015-03-12 MED ORDER — ONDANSETRON HCL 4 MG/2ML IJ SOLN
INTRAMUSCULAR | Status: DC | PRN
  Administered 2015-03-12 (×2): 4 mg via INTRAVENOUS

## 2015-03-12 MED ORDER — LIDOCAINE HCL (CARDIAC) 20 MG/ML IV SOLN
INTRAVENOUS | Status: AC
  Filled 2015-03-12: qty 5

## 2015-03-12 MED ORDER — BUPIVACAINE-EPINEPHRINE (PF) 0.5% -1:200000 IJ SOLN
INTRAMUSCULAR | Status: DC | PRN
  Administered 2015-03-12: 25 mL via PERINEURAL

## 2015-03-12 MED ORDER — PROMETHAZINE HCL 25 MG/ML IJ SOLN: 6.2500 mg | INTRAMUSCULAR | Status: DC | PRN

## 2015-03-12 MED ORDER — PROPOFOL 10 MG/ML IV BOLUS
INTRAVENOUS | Status: AC
  Filled 2015-03-12: qty 20

## 2015-03-12 MED ORDER — ONDANSETRON HCL 4 MG/2ML IJ SOLN
INTRAMUSCULAR | Status: AC
  Filled 2015-03-12: qty 2

## 2015-03-12 MED ORDER — FENTANYL CITRATE (PF) 100 MCG/2ML IJ SOLN
INTRAMUSCULAR | Status: AC
  Administered 2015-03-12: 100 ug
  Filled 2015-03-12: qty 2

## 2015-03-12 MED ORDER — ARTIFICIAL TEARS OP OINT
TOPICAL_OINTMENT | OPHTHALMIC | Status: DC | PRN
  Administered 2015-03-12: 1 via OPHTHALMIC

## 2015-03-12 MED ORDER — BUPROPION HCL ER (XL) 150 MG PO TB24
150.0000 mg | ORAL_TABLET | Freq: Every day | ORAL | Status: DC
  Administered 2015-03-12 – 2015-03-14 (×3): 150 mg via ORAL
  Filled 2015-03-12 (×3): qty 1

## 2015-03-12 MED ORDER — HYDROMORPHONE HCL 1 MG/ML IJ SOLN: 0.2500 mg | INTRAMUSCULAR | Status: DC | PRN

## 2015-03-12 MED ORDER — ACETAMINOPHEN 325 MG PO TABS: 650.0000 mg | ORAL_TABLET | Freq: Four times a day (QID) | ORAL | Status: DC | PRN

## 2015-03-12 MED ORDER — MORPHINE SULFATE (PF) 2 MG/ML IV SOLN
2.0000 mg | INTRAVENOUS | Status: DC | PRN
  Administered 2015-03-13 (×5): 2 mg via INTRAVENOUS
  Filled 2015-03-12 (×5): qty 1

## 2015-03-12 MED ORDER — CEFAZOLIN SODIUM-DEXTROSE 2-3 GM-% IV SOLR
2.0000 g | Freq: Four times a day (QID) | INTRAVENOUS | Status: AC
  Administered 2015-03-12 – 2015-03-13 (×3): 2 g via INTRAVENOUS
  Filled 2015-03-12 (×3): qty 50

## 2015-03-12 MED ORDER — OXYCODONE HCL 5 MG PO TABS: 5.0000 mg | ORAL_TABLET | Freq: Once | ORAL | Status: DC | PRN

## 2015-03-12 SURGICAL SUPPLY — 77 items
BLADE SAW SAG 73X25 THK (BLADE) ×1
BLADE SAW SGTL 73X25 THK (BLADE) ×1 IMPLANT
BUR SURG 4X8 MED (BURR) IMPLANT
BURR SURG 4X8 MED (BURR)
CAPT SHLDR TOTAL 2 ×1 IMPLANT
CEMENT BONE DEPUY (Cement) ×3 IMPLANT
CEMENT MIXING SYSTEM 3DOSE (KITS) ×1 IMPLANT
COVER SURGICAL LIGHT HANDLE (MISCELLANEOUS) ×2 IMPLANT
DRAPE IMP U-DRAPE 54X76 (DRAPES) ×2 IMPLANT
DRAPE INCISE IOBAN 66X45 STRL (DRAPES) ×4 IMPLANT
DRAPE ORTHO SPLIT 77X108 STRL (DRAPES) ×4
DRAPE SURG ORHT 6 SPLT 77X108 (DRAPES) IMPLANT
DRAPE U-SHAPE 47X51 STRL (DRAPES) ×2 IMPLANT
DRAPE X-RAY CASS 24X20 (DRAPES) IMPLANT
DRILL BIT 5/64 (BIT) ×2 IMPLANT
DRSG ADAPTIC 3X8 NADH LF (GAUZE/BANDAGES/DRESSINGS) ×2 IMPLANT
DRSG PAD ABDOMINAL 8X10 ST (GAUZE/BANDAGES/DRESSINGS) ×3 IMPLANT
DURAPREP 26ML APPLICATOR (WOUND CARE) ×2 IMPLANT
ELECT BLADE 4.0 EZ CLEAN MEGAD (MISCELLANEOUS) ×2
ELECT LOOP CUT MONO 26F .012 R (MISCELLANEOUS) ×1 IMPLANT
ELECT NDL TIP 2.8 STRL (NEEDLE) ×1 IMPLANT
ELECT NEEDLE TIP 2.8 STRL (NEEDLE) ×2 IMPLANT
ELECT REM PT RETURN 9FT ADLT (ELECTROSURGICAL) ×2
ELECTRODE BLDE 4.0 EZ CLN MEGD (MISCELLANEOUS) ×1 IMPLANT
ELECTRODE REM PT RTRN 9FT ADLT (ELECTROSURGICAL) ×1 IMPLANT
GAUZE SPONGE 4X4 12PLY STRL (GAUZE/BANDAGES/DRESSINGS) ×2 IMPLANT
GLOVE BIOGEL PI ORTHO PRO 7.5 (GLOVE) ×1
GLOVE BIOGEL PI ORTHO PRO SZ8 (GLOVE) ×2
GLOVE ORTHO TXT STRL SZ7.5 (GLOVE) ×3 IMPLANT
GLOVE PI ORTHO PRO STRL 7.5 (GLOVE) ×1 IMPLANT
GLOVE PI ORTHO PRO STRL SZ8 (GLOVE) ×1 IMPLANT
GLOVE SURG ORTHO 8.5 STRL (GLOVE) ×4 IMPLANT
GOWN STRL REUS W/ TWL XL LVL3 (GOWN DISPOSABLE) ×3 IMPLANT
GOWN STRL REUS W/TWL XL LVL3 (GOWN DISPOSABLE) ×6
HANDPIECE INTERPULSE COAX TIP (DISPOSABLE)
KIT BASIN OR (CUSTOM PROCEDURE TRAY) ×2 IMPLANT
KIT ROOM TURNOVER OR (KITS) ×2 IMPLANT
MANIFOLD NEPTUNE II (INSTRUMENTS) ×2 IMPLANT
NDL 1/2 CIR MAYO (NEEDLE) ×1 IMPLANT
NDL HYPO 25GX1X1/2 BEV (NEEDLE) ×1 IMPLANT
NDL SUT 6 .5 CRC .975X.05 MAYO (NEEDLE) ×1 IMPLANT
NEEDLE 1/2 CIR MAYO (NEEDLE) ×2 IMPLANT
NEEDLE HYPO 25GX1X1/2 BEV (NEEDLE) ×2 IMPLANT
NEEDLE MAYO TAPER (NEEDLE) ×2
NS IRRIG 1000ML POUR BTL (IV SOLUTION) ×2 IMPLANT
PACK SHOULDER (CUSTOM PROCEDURE TRAY) ×2 IMPLANT
PACK UNIVERSAL I (CUSTOM PROCEDURE TRAY) ×2 IMPLANT
PAD ARMBOARD 7.5X6 YLW CONV (MISCELLANEOUS) ×4 IMPLANT
PIN METAGLENE 2.5 (PIN) IMPLANT
SET HNDPC FAN SPRY TIP SCT (DISPOSABLE) IMPLANT
SLING ARM IMMOBILIZER LRG (SOFTGOODS) ×2 IMPLANT
SLING ARM IMMOBILIZER MED (SOFTGOODS) IMPLANT
SMARTMIX MINI TOWER (MISCELLANEOUS) ×2
SPONGE GAUZE 4X4 12PLY STER LF (GAUZE/BANDAGES/DRESSINGS) ×1 IMPLANT
SPONGE LAP 18X18 X RAY DECT (DISPOSABLE) ×2 IMPLANT
SPONGE LAP 4X18 X RAY DECT (DISPOSABLE) ×2 IMPLANT
SPONGE SURGIFOAM ABS GEL SZ50 (HEMOSTASIS) ×1 IMPLANT
STRIP CLOSURE SKIN 1/2X4 (GAUZE/BANDAGES/DRESSINGS) ×2 IMPLANT
SUCTION FRAZIER TIP 10 FR DISP (SUCTIONS) ×2 IMPLANT
SUT BONE WAX W31G (SUTURE) ×1 IMPLANT
SUT FIBERWIRE #2 38 T-5 BLUE (SUTURE) ×4
SUT MNCRL AB 4-0 PS2 18 (SUTURE) ×2 IMPLANT
SUT VIC AB 0 CT1 27 (SUTURE) ×4
SUT VIC AB 0 CT1 27XBRD ANBCTR (SUTURE) ×1 IMPLANT
SUT VIC AB 2-0 CT1 27 (SUTURE) ×2
SUT VIC AB 2-0 CT1 TAPERPNT 27 (SUTURE) ×1 IMPLANT
SUT VICRYL AB 2 0 TIES (SUTURE) ×2 IMPLANT
SUTURE FIBERWR #2 38 T-5 BLUE (SUTURE) ×2 IMPLANT
SYR CONTROL 10ML LL (SYRINGE) ×3 IMPLANT
TAPE STRIPS DRAPE STRL (GAUZE/BANDAGES/DRESSINGS) ×1 IMPLANT
TOWEL OR 17X24 6PK STRL BLUE (TOWEL DISPOSABLE) ×2 IMPLANT
TOWEL OR 17X26 10 PK STRL BLUE (TOWEL DISPOSABLE) ×2 IMPLANT
TOWER SMARTMIX MINI (MISCELLANEOUS) ×1 IMPLANT
TRAY FOLEY CATH 16FRSI W/METER (SET/KITS/TRAYS/PACK) ×2 IMPLANT
TUBE CONNECTING 12X1/4 (SUCTIONS) ×1 IMPLANT
WATER STERILE IRR 1000ML POUR (IV SOLUTION) ×2 IMPLANT
YANKAUER SUCT BULB TIP NO VENT (SUCTIONS) ×1 IMPLANT

## 2015-03-12 NOTE — Discharge Instructions (Signed)
Ice to the shoulder at all times.  Do not lift push or pull with the left arm. Use sling when out of the house.  In the home, ok to remove the sling and hug a pillow.  Do exercises every hour - lap slides, pendulums, rotation exercises.  Keep the incision clean and dry for one week, then ok to get wet in the shower.  Follow up in two weeks with Dr Veverly Fells [phone]

## 2015-03-12 NOTE — Anesthesia Preprocedure Evaluation (Signed)
Anesthesia Evaluation  Patient identified by MRN, date of birth, ID band Patient awake    Reviewed: Allergy & Precautions, H&P , NPO status , Patient's Chart, lab work & pertinent test results  History of Anesthesia Complications (+) history of anesthetic complications  Airway Mallampati: I  TM Distance: >3 FB Neck ROM: Full    Dental no notable dental hx. (+) Teeth Intact, Dental Advisory Given   Pulmonary neg pulmonary ROS, asthma ,    Pulmonary exam normal breath sounds clear to auscultation       Cardiovascular hypertension, On Medications  Rhythm:Regular Rate:Normal     Neuro/Psych  Headaches, Depression    GI/Hepatic Neg liver ROS, GERD  ,  Endo/Other  Hypothyroidism   Renal/GU negative Renal ROS  negative genitourinary   Musculoskeletal  (+) Arthritis ,   Abdominal   Peds  Hematology negative hematology ROS (+)   Anesthesia Other Findings   Reproductive/Obstetrics negative OB ROS                             Anesthesia Physical  Anesthesia Plan  ASA: II  Anesthesia Plan: General and Regional   Post-op Pain Management:    Induction: Intravenous  Airway Management Planned: Oral ETT  Additional Equipment:   Intra-op Plan:   Post-operative Plan: Extubation in OR  Informed Consent: I have reviewed the patients History and Physical, chart, labs and discussed the procedure including the risks, benefits and alternatives for the proposed anesthesia with the patient or authorized representative who has indicated his/her understanding and acceptance.   Dental advisory given  Plan Discussed with: CRNA  Anesthesia Plan Comments:         Anesthesia Quick Evaluation

## 2015-03-12 NOTE — Interval H&P Note (Signed)
History and Physical Interval Note:  03/12/2015 12:36 PM  Christine Valdez  has presented today for surgery, with the diagnosis of LEFT SHOULDER OA   The various methods of treatment have been discussed with the patient and family. After consideration of risks, benefits and other options for treatment, the patient has consented to  Procedure(s): LEFT SHOULDER TOTAL SHOULDER ARTHROPLASTY (Left) as a surgical intervention .  The patient's history has been reviewed, patient examined, no change in status, stable for surgery.  I have reviewed the patient's chart and labs.  Questions were answered to the patient's satisfaction.     Anthonia Monger,STEVEN R

## 2015-03-12 NOTE — Transfer of Care (Signed)
Immediate Anesthesia Transfer of Care Note  Patient: Christine Valdez  Procedure(s) Performed: Procedure(s): LEFT SHOULDER TOTAL SHOULDER ARTHROPLASTY (Left)  Patient Location: PACU  Anesthesia Type:GA combined with regional for post-op pain  Level of Consciousness: awake, alert , oriented and patient cooperative  Airway & Oxygen Therapy: Patient Spontanous Breathing and Patient connected to nasal cannula oxygen  Post-op Assessment: Report given to RN and Post -op Vital signs reviewed and stable  Post vital signs: Reviewed and stable  Last Vitals:  Filed Vitals:   03/12/15 1529 03/12/15 1530  BP: 137/86   Pulse: 95 94  Temp:    Resp: 23 17    Complications: No apparent anesthesia complications

## 2015-03-12 NOTE — Anesthesia Procedure Notes (Signed)
Anesthesia Regional Block:  Interscalene brachial plexus block  Pre-Anesthetic Checklist: ,, timeout performed, Correct Patient, Correct Site, Correct Laterality, Correct Procedure, Correct Position, site marked, Risks and benefits discussed,  Surgical consent,  Pre-op evaluation,  At surgeon's request and post-op pain management  Laterality: Left  Prep: chloraprep       Needles:  Injection technique: Single-shot  Needle Type: Echogenic Stimulator Needle     Needle Length: 5cm 5 cm Needle Gauge: 21 and 21 G    Additional Needles:  Procedures: ultrasound guided (picture in chart) and nerve stimulator Interscalene brachial plexus block Narrative:  Injection made incrementally with aspirations every 5 mL.  Performed by: Personally  Anesthesiologist: Shawndrea Rutkowski  Additional Notes: Risks, benefits and alternative to block explained extensively.  Patient tolerated procedure well, without complications.

## 2015-03-12 NOTE — Brief Op Note (Signed)
03/12/2015  3:07 PM  PATIENT:  Christine Valdez  64 y.o. female  PRE-OPERATIVE DIAGNOSIS:  LEFT SHOULDER OA, END STAGE  POST-OPERATIVE DIAGNOSIS:  LEFT SHOULDER OA, END STAGE  PROCEDURE:  Procedure(s): LEFT SHOULDER TOTAL SHOULDER ARTHROPLASTY (Left) DePuy Global Unite  SURGEON:  Surgeon(s) and Role:    * Netta Cedars, MD - Primary  PHYSICIAN ASSISTANT:   ASSISTANTS: Ventura Bruns, PA-C   ANESTHESIA:   regional and general  EBL:  Total I/O In: 1000 [I.V.:1000] Out: 150 [Blood:150]  BLOOD ADMINISTERED:none  DRAINS: none   LOCAL MEDICATIONS USED:  MARCAINE     SPECIMEN:  No Specimen  DISPOSITION OF SPECIMEN:  N/A  COUNTS:  YES  TOURNIQUET:  * No tourniquets in log *  DICTATION: .Other Dictation: Dictation Number [id number]  PLAN OF CARE: Admit to inpatient   PATIENT DISPOSITION:  PACU - hemodynamically stable.   Delay start of Pharmacological VTE agent (>24hrs) due to surgical blood loss or risk of bleeding: not applicable

## 2015-03-13 LAB — BASIC METABOLIC PANEL
ANION GAP: 9 (ref 5–15)
BUN: 13 mg/dL (ref 6–20)
CO2: 25 mmol/L (ref 22–32)
Calcium: 9.5 mg/dL (ref 8.9–10.3)
Chloride: 104 mmol/L (ref 101–111)
Creatinine, Ser: 0.92 mg/dL (ref 0.44–1.00)
GLUCOSE: 141 mg/dL — AB (ref 65–99)
POTASSIUM: 3.9 mmol/L (ref 3.5–5.1)
Sodium: 138 mmol/L (ref 135–145)

## 2015-03-13 LAB — HEMOGLOBIN AND HEMATOCRIT, BLOOD
HEMATOCRIT: 35.5 % — AB (ref 36.0–46.0)
HEMOGLOBIN: 11.7 g/dL — AB (ref 12.0–15.0)

## 2015-03-13 MED ORDER — KETOROLAC TROMETHAMINE 10 MG PO TABS
10.0000 mg | ORAL_TABLET | Freq: Four times a day (QID) | ORAL | Status: DC | PRN
  Administered 2015-03-13 – 2015-03-14 (×3): 10 mg via ORAL
  Filled 2015-03-13 (×6): qty 1

## 2015-03-13 NOTE — Progress Notes (Signed)
Patient is having difficulty with pain management.  Patient indicated she stayed 3 days with previous surgery due to pain and trouble voiding, but her oxygen also desaturated with pain regimen.  Per patient request, on call PA Mechele Claude contacted regarding delaying discharge. Information provided by patient about previous surgery was provided to PA.  Discharge delayed until Sunday.  Toradol also added to [facility].  Will continue to monitor patient and pain levels.

## 2015-03-13 NOTE — Op Note (Signed)
NAMEKARROL, PEROT NO.:  [id number]  MEDICAL RECORD NO.:  [id number]  LOCATION:  [id number]                        FACILITY:  [facility]  PHYSICIAN:  Doran Heater. Veverly Fells, M.D. DATE OF BIRTH:  1951/01/13  DATE OF PROCEDURE:  03/12/2015 DATE OF DISCHARGE:                              OPERATIVE REPORT   PREOPERATIVE DIAGNOSIS:  Left shoulder osteoarthritis, end-stage.  POSTOPERATIVE DIAGNOSIS:  Left shoulder osteoarthritis, end-stage.  PROCEDURE PERFORMED:  Left total shoulder arthroplasty using DePuy Global Unite prosthesis.  ATTENDING SURGEON:  Doran Heater. Veverly Fells, MD.  ASSISTANT:  Charletta Cousin Dixon, [place], who scrubbed the entire procedure and necessary for satisfactory completion of surgery.  ANESTHESIA:  General anesthesia was used plus interscalene block.  ESTIMATED BLOOD LOSS:  150 mL.  FLUID REPLACED:  1500 mL crystalloid.  INSTRUMENT COUNTS:  Correct.  COMPLICATIONS:  There were no complications.  ANTIBIOTICS:  Perioperative antibiotics were given.  INDICATIONS:  The patient is a 64 year old female with worsening left shoulder pain secondary to end-stage arthritis.  The patient has bone-on- bone changes on x-ray and MRI scan.  She has previously had a right total shoulder arthroplasty and had done well following that.  Due to progressive pain and declining function, she wishes to proceed with shoulder replacement on the left.  Informed consent obtained.  DESCRIPTION OF PROCEDURE:  After an adequate level of anesthesia was achieved, the patient was positioned in a modified beach-chair position. Left shoulder correctly identified, sterilely prepped and draped in usual manner.  Time-out called.  We entered the shoulder using standard anterior deltopectoral incision starting at the coracoid process and extending down to the anterior humerus, dissection down through subcutaneous tissues.  Identified the cephalic vein, took it laterally at the deltoid,  pectoralis taken medially.  Conjoined tendon identified and retracted medially.  Subscapularis taken off subperiosteally off the lesser tuberosity.  Capsular release performed off the inferior neck and progressive external rotation.  We went ahead and placed the T-handled Crego elevator over the top of the humeral head and then a Crego elevator on the medial side of the humerus and then x-ray rotated about 20-25 degrees.  We placed our neck resection guide and marked the humeral head and then resected the humeral head with the oscillating saw.  Bone-on-bone arthritis was noted with complete eburnated bone.  We next went ahead and removed excess osteophytes with a rongeur from the medial neck and posteriorly.  We then prepared the humerus with sequential reaming up to size 10 and then broached for the size 10 metaphyseal portion.  Once we made sure that we had the size 10 which fit appropriately with 20-25 degrees of retroversion, we removed the trial component, we subluxed the humerus posteriorly and did a 360 degrees labral removal and capsular release including biceps tenodesis and tenotomy.  We clipped the remaining biceps tendon and secured the biceps to the pectoralis in the bicipital groove.  Next, we went ahead and found our center point for the glenoid surface, placed our central guidepin, sized it to size 40 and then reamed for the 40 glenoid.  We did our peripheral hand reaming this with an Anchor Peg Glenoid by a DePuy.  We then  drilled our central peg hole for the Anchor Peg Glenoid, then the 3 peripheral holes centered on the inferior border of the scapula which we could palpate with a Soil scientist.  We were careful to protect the axillary nerve in this portion of the procedure.  Once we had our holes drilled, we irrigated thoroughly and placed our trial 40 glenoid prosthesis that gave [place] nice coverage.  We then went ahead and removed the trial component.  We placed epi  soaked Gelfoam around the periphery and then cemented the component into place just in the 3 peripheral holes, then cemented the central PEG.  Impacted that in position, well-supported, we allowed that to completely set.  We then went ahead and finished our preparation of the humerus with placing drill holes in the lesser tuberosity and placing a #2 FiberWire suture in a mattress fashion for repair of the subscap.  We then used impaction grafting technique and impacted the size 10 stem in place.  While impacting the head in place, we noticed a little bit of motion in the stem, I felt like this was little bit loose and I could actually generate a little bit of motion there pushing hard on the stem, so we decided to go ahead and remove the humeral head, removed the stem and then do a Hybrid System where we would go ahead and do cementing of the distal portion of the stem.  So we irrigated and dried the distal canal. We then used a DePuy 1 cement and placed that by hand into the canal distally to allow [place] to cement distally to get initial fixation and for the coracoid proximally, we did bone grafting.  So we impacted the stem into place, allowed the cement to set, and then re-impacted the humeral head which was sized to a size 44, 18 eccentric, placed that eccentricity up toward the rotator cuff.  We impacted that in position, rotator cuff @ shape, reduced the shoulder, had nice soft tissue balancing, and then repaired the subscapularis anatomically with FiberWire suture.  We had excellent apposition of that subscapularis and also rotator interval repair.  We ranged the shoulder, we had excellent motion, external rotation to about 30 degrees before got into some decent tension there and forward elevation well above 90 degrees.  We irrigated thoroughly and repaired the deltopectoral interval with 0 Vicryl suture followed by 2-0 Vicryl subcutaneous closure and 4-0 Monocryl for skin.   Steri-Strips applied followed by sterile dressing. The patient tolerated the surgery well.     Doran Heater. Veverly Fells, M.D.     SRN/MEDQ  D:  03/12/2015  T:  03/13/2015  Job:  [id number]

## 2015-03-13 NOTE — Progress Notes (Signed)
Occupational Therapy Evaluation Patient Details Name: Christine Valdez MRN: [id number] DOB: [birth year] Today's Date: 03/13/2015    History of Present Illness s/p L TSA   Clinical Impression   Educated pt on compensatory techniques for ADL, management of LUE and HEP. Pt will need to follow up with Dr. Veverly Fells to progress therapy as indicated. Pt asked therapist to return to educate her husband prior to D/C. Pager number left for nsg to page this therapist. Will follow acutely.     Follow Up Recommendations  Other (comment);Supervision - Intermittent    Equipment Recommendations  None recommended by OT    Recommendations for Other Services       Precautions / Restrictions Precautions Precautions: Shoulder Type of Shoulder Precautions: conservative Shoulder Interventions: Shoulder sling/immobilizer;Off for dressing/bathing/exercises;At all times Precaution Booklet Issued: Yes (comment) Precaution Comments: pendulums, lap slides, [facility] AROM Restrictions LUE Weight Bearing: Non weight bearing      Mobility Bed Mobility Overal bed mobility: Independent                Transfers Overall transfer level: Independent                    Balance Overall balance assessment: No apparent balance deficits (not formally assessed)                                          ADL Overall ADL's : Needs assistance/impaired                                       General ADL Comments: completed education regarding compensatory techniques for ADL, positioning of LUE in sitting and in bed and management of sling. Strap adjusted to support arm with waist strap due to pt's concern of arm moving away from her body     Vision     Perception     Praxis      Pertinent Vitals/Pain Pain Assessment: 0-10 Pain Score: 3  Pain Location: L shoulder Pain Descriptors / Indicators: Aching Pain Intervention(s): Limited activity within patient's  tolerance;Monitored during session;Repositioned;Ice applied     Hand Dominance Right   Extremity/Trunk Assessment Upper Extremity Assessment Upper Extremity Assessment: LUE deficits/detail LUE Deficits / Details: elbow,wrist and hand ROM WFL (able to complete pendulums and lap slides)   Lower Extremity Assessment Lower Extremity Assessment: Defer to PT evaluation   Cervical / Trunk Assessment Cervical / Trunk Assessment: Normal   Communication Communication Communication: No difficulties   Cognition Arousal/Alertness: Awake/alert Behavior During Therapy: WFL for tasks assessed/performed Overall Cognitive Status: Within Functional Limits for tasks assessed                     General Comments       Exercises Exercises: Shoulder;Other exercises Other Exercises Other Exercises: LUE lap slides x 10   Shoulder Instructions Shoulder Instructions Donning/doffing shirt without moving shoulder: Minimal assistance Method for sponge bathing under operated UE: Minimal assistance Donning/doffing sling/immobilizer: Minimal assistance Correct positioning of sling/immobilizer: Minimal assistance Pendulum exercises (written home exercise program): Supervision/safety ROM for elbow, wrist and digits of operated UE: Modified independent Sling wearing schedule (on at all times/off for ADL's): Independent Proper positioning of operated UE when showering: Independent Positioning of UE while sleeping: Supervision/safety    Home Living Family/patient expects  to be discharged to:: Private residence Living Arrangements: Spouse/significant other Available Help at Discharge: Family;Available 24 hours/day Type of Home: House       Home Layout: One level     Bathroom Shower/Tub: Occupational psychologist: Standard     Home Equipment: None          Prior Functioning/Environment Level of Independence: Independent             OT Diagnosis: Generalized  weakness;Acute pain   OT Problem List: Decreased strength;Decreased range of motion;Decreased knowledge of use of DME or AE;Decreased knowledge of precautions;Pain;Impaired UE functional use   OT Treatment/Interventions: Self-care/ADL training;Therapeutic exercise;DME and/or AE instruction;Patient/family education;Therapeutic activities    OT Goals(Current goals can be found in the care plan section) Acute Rehab OT Goals Patient Stated Goal: to be able to use my arm again OT Goal Formulation: With patient Time For Goal Achievement: 03/15/15 Potential to Achieve Goals: Good  OT Frequency: Min 2X/week   Barriers to D/C:            Co-evaluation              End of Session Nurse Communication: Mobility status  Activity Tolerance: Patient tolerated treatment well Patient left: in chair;with call bell/phone within reach   Time: 1022-1054 OT Time Calculation (min): 32 min Charges:  OT General Charges $OT Visit: 1 Procedure OT Evaluation $Initial OT Evaluation Tier I: 1 Procedure OT Treatments $Self Care/Home Management : 8-22 mins G-Codes:    Julienne Vogler,HILLARY 2015-03-20, 11:07 AM   Maurie Boettcher, OTR/L  [phone] 2015-03-20

## 2015-03-13 NOTE — Progress Notes (Signed)
Subjective: 1 Day Post-Op Procedure(s) (LRB): LEFT SHOULDER TOTAL SHOULDER ARTHROPLASTY (Left) Patient reports pain as mild.  No n/v.  Tolerating regular diet.  Pain controlled with oral pain meds.  Objective: Vital signs in last 24 hours: Temp:  [97.9 F (36.6 C)-99.8 F (37.7 C)] 97.9 F (36.6 C) (12/03 0520) Pulse Rate:  [74-97] 74 (12/03 0520) Resp:  [12-31] 18 (12/03 0520) BP: (104-137)/(61-88) 115/71 mmHg (12/03 0520) SpO2:  [93 %-100 %] 96 % (12/03 0520) Weight:  [74.872 kg (165 lb 1 oz)] 74.872 kg (165 lb 1 oz) (12/02 1125)  Intake/Output from previous day: 12/02 0701 - 12/03 0700 In: 2160 [P.O.:480; I.V.:1580; IV Piggyback:100] Out: 150 [Blood:150] Intake/Output this shift:     Recent Labs  03/13/15 0350  HGB 11.7*    Recent Labs  03/13/15 0350  HCT 35.5*    Recent Labs  03/13/15 0350  NA 138  K 3.9  CL 104  CO2 25  BUN 13  CREATININE 0.92  GLUCOSE 141*  CALCIUM 9.5   No results for input(s): LABPT, INR in the last 72 hours.  PE:  wn wd woman in nad.  L shoulder dressed and dry.  NVI at L UE.  Assessment/Plan: 1 Day Post-Op Procedure(s) (LRB): LEFT SHOULDER TOTAL SHOULDER ARTHROPLASTY (Left) D/c home today.  Wylene Simmer 03/13/2015, 9:19 AM

## 2015-03-14 MED ORDER — KETOROLAC TROMETHAMINE 10 MG PO TABS: 10.0000 mg | ORAL_TABLET | Freq: Four times a day (QID) | ORAL | Status: DC | PRN

## 2015-03-14 NOTE — Progress Notes (Signed)
Discharge instructions and prescriptions provided to patient. Patient and spouse without discharge related questions. IV removed.  All belongings with patient.  NT and husband accompanying patient upon discharge.

## 2015-03-14 NOTE — Progress Notes (Signed)
   Subjective: 2 Days Post-Op Procedure(s) (LRB): LEFT SHOULDER TOTAL SHOULDER ARTHROPLASTY (Left)  Pt feeling better today Still c/o moderate pain but better control with pain medication Denies any new symptoms or issues Patient reports pain as moderate.  Objective:   VITALS:   Filed Vitals:   03/13/15 1954 03/14/15 0654  BP: 120/73 122/71  Pulse: 84 84  Temp: 98.4 F (36.9 C) 98 F (36.7 C)  Resp: 16 16    Left shoulder dressing and sling intact nv intact distally No rashes or edema  LABS  Recent Labs  03/13/15 0350  HGB 11.7*  HCT 35.5*     Recent Labs  03/13/15 0350  NA 138  K 3.9  BUN 13  CREATININE 0.92  GLUCOSE 141*     Assessment/Plan: 2 Days Post-Op Procedure(s) (LRB): LEFT SHOULDER TOTAL SHOULDER ARTHROPLASTY (Left) D/c home today after therapy Pain management F/u in 2 weeks     Merla Riches, MPAS, PA-C  03/14/2015, 7:24 AM

## 2015-03-14 NOTE — Progress Notes (Signed)
OT Cancellation Note  Patient Details Name: Christine Valdez MRN: [id number] DOB: May 11, 1950   Cancelled Treatment:    Reason Eval/Treat Not Completed: Other (comment). Met with pt. In attempts for skilled OT. Pt. Reports she has already completed her exercises this am and has 2 more sets to go today.  States she is able to don/doff sling and is familiar with the whole process due to having a previous shoulder sx before.  Prefers to rest now and declines need for any further skilled OT prior to d/c home later today.  Will alert OTR/L to sign off per pt. Request.    Janice Coffin, COTA/L 03/14/2015, 10:09 AM

## 2015-03-14 NOTE — Discharge Summary (Signed)
Physician Discharge Summary   Patient ID: Christine Valdez MRN: [id number] DOB/AGE: 1950-05-31 63 y.o.  Admit date: 03/12/2015 Discharge date: 03/14/2015  Admission Diagnoses:  Active Problems:   S/P shoulder replacement   Discharge Diagnoses:  Same   Surgeries: Procedure(s): LEFT SHOULDER TOTAL SHOULDER ARTHROPLASTY on 03/12/2015   Consultants: PT/OT  Discharged Condition: Stable  Hospital Course: Christine Valdez is an 64 y.o. female who was admitted 03/12/2015 with a chief complaint of No chief complaint on file. , and found to have a diagnosis of <principal problem not specified>.  They were brought to the operating room on 03/12/2015 and underwent the above named procedures.    The patient had an uncomplicated hospital course and was stable for discharge.  Recent vital signs:  Filed Vitals:   03/13/15 1954 03/14/15 0654  BP: 120/73 122/71  Pulse: 84 84  Temp: 98.4 F (36.9 C) 98 F (36.7 C)  Resp: 16 16    Recent laboratory studies:  Results for orders placed or performed during the hospital encounter of 03/12/15  Hemoglobin and hematocrit, blood  Result Value Ref Range   Hemoglobin 11.7 (L) 12.0 - 15.0 g/dL   HCT 35.5 (L) 36.0 - [id number] %  Basic metabolic panel  Result Value Ref Range   Sodium 138 135 - 145 mmol/L   Potassium 3.9 3.5 - 5.1 mmol/L   Chloride 104 101 - 111 mmol/L   CO2 25 22 - 32 mmol/L   Glucose, Bld 141 (H) 65 - 99 mg/dL   BUN 13 6 - 20 mg/dL   Creatinine, Ser 0.92 0.44 - 1.00 mg/dL   Calcium 9.5 8.9 - 10.3 mg/dL   GFR calc non Af Amer >60 >60 mL/min   GFR calc Af Amer >60 >60 mL/min   Anion gap 9 5 - 15    Discharge Medications:     Medication List    STOP taking these medications        chlorpheniramine-HYDROcodone 10-8 MG/5ML Suer  Commonly known as:  TUSSIONEX PENNKINETIC ER     penicillin v potassium 500 MG tablet  Commonly known as:  VEETID      TAKE these medications        ALAWAY OP  Place 1 drop into both eyes  daily as needed (for allergies).     albuterol 108 (90 BASE) MCG/ACT inhaler  Commonly known as:  PROVENTIL HFA;VENTOLIN HFA  Inhale 2 puffs into the lungs every 6 (six) hours as needed for shortness of breath.     buPROPion 150 MG 24 hr tablet  Commonly known as:  WELLBUTRIN XL  TAKE 1 TABLET (150 MG TOTAL) BY MOUTH DAILY.     cholecalciferol 1000 UNITS tablet  Commonly known as:  VITAMIN D  Take 1,000 Units by mouth daily.     citalopram 40 MG tablet  Commonly known as:  CELEXA  Take 40 mg by mouth daily.     docusate sodium 100 MG capsule  Commonly known as:  COLACE  Take 400 mg by mouth at bedtime.     Fish Oil 1000 MG Caps  Take 1 capsule by mouth daily.     fluconazole 150 MG tablet  Commonly known as:  DIFLUCAN  Take 1 tablet (150 mg total) by mouth once. Repeat if needed     fluticasone 50 MCG/ACT nasal spray  Commonly known as:  FLONASE  Place 2 sprays into both nostrils daily as needed for allergies.     HYDROcodone-acetaminophen 5-325 MG  tablet  Commonly known as:  NORCO/VICODIN  Take 1-2 tablets by mouth every 6 (six) hours as needed for moderate pain.     HYDROcodone-acetaminophen 5-325 MG tablet  Commonly known as:  NORCO  Take 1-2 tablets by mouth every 6 (six) hours as needed for moderate pain.     levothyroxine 75 MCG tablet  Commonly known as:  SYNTHROID, LEVOTHROID  Take 75 mcg by mouth daily.     loratadine 10 MG tablet  Commonly known as:  CLARITIN  Take 10 mg by mouth daily as needed for allergies.     methocarbamol 500 MG tablet  Commonly known as:  ROBAXIN  Take 1 tablet (500 mg total) by mouth 3 (three) times daily as needed.     multivitamin tablet  Take 1 tablet by mouth daily.     MUSCLE RUB 10-15 % Crea  Apply 1 application topically daily as needed for muscle pain.     temazepam 30 MG capsule  Commonly known as:  RESTORIL  Take 1 capsule (30 mg total) by mouth at bedtime as needed for sleep.     traMADol-acetaminophen  37.5-325 MG tablet  Commonly known as:  ULTRACET  Take 1 tablet by mouth every 6 (six) hours as needed for moderate pain.        Diagnostic Studies: Dg Shoulder Left Port  03/12/2015  CLINICAL DATA:  S/P shoulder replacement, left EXAM: LEFT SHOULDER - 1 VIEW COMPARISON:  Chest x-ray 07/05/2013 FINDINGS: Status post left shoulder arthroplasty. No evidence for dislocation. IMPRESSION: No adverse features following shoulder replacement. Electronically Signed   By: Nolon Nations M.D.   On: 03/12/2015 15:56    Disposition: 01-Home or Self Care      Discharge Instructions    Call MD / Call 911    Complete by:  As directed   If you experience chest pain or shortness of breath, CALL 911 and be transported to the hospital emergency room.  If you develope a fever above 101 F, pus (white drainage) or increased drainage or redness at the wound, or calf pain, call your surgeon's office.     Constipation Prevention    Complete by:  As directed   Drink plenty of fluids.  Prune juice may be helpful.  You may use a stool softener, such as Colace (over the counter) 100 mg twice a day.  Use MiraLax (over the counter) for constipation as needed.     Diet - low sodium heart healthy    Complete by:  As directed      Increase activity slowly as tolerated    Complete by:  As directed            Follow-up Information    Follow up with NORRIS,STEVEN R, MD. Call in 2 weeks.   Specialty:  Orthopedic Surgery   Why:  [phone]   Contact information:   [address] [place] [address] [id number]        Signed: Ventura Bruns 03/14/2015, 7:26 AM

## 2015-03-15 ENCOUNTER — Encounter (HOSPITAL_COMMUNITY): Payer: Self-pay | Admitting: Orthopedic Surgery

## 2015-03-17 NOTE — Anesthesia Postprocedure Evaluation (Signed)
Anesthesia Post Note  Patient: Christine Valdez  Procedure(s) Performed: Procedure(s) (LRB): LEFT SHOULDER TOTAL SHOULDER ARTHROPLASTY (Left)  Patient location during evaluation: PACU Anesthesia Type: General Level of consciousness: awake and alert Pain management: pain level controlled Vital Signs Assessment: post-procedure vital signs reviewed and stable Respiratory status: spontaneous breathing, nonlabored ventilation, respiratory function stable and patient connected to nasal cannula oxygen Cardiovascular status: blood pressure returned to baseline and stable Postop Assessment: no signs of nausea or vomiting Anesthetic complications: no                  Zenaida Deed

## 2015-03-30 IMAGING — CR DG CHEST 2V
2 series · 2 of 2 positions shown · non-contrast
Comparison: CHEST dated 12/19/2006

CLINICAL DATA: Preoperative for right shoulder replacement; history
of hypertension and bronchitis

EXAM:
CHEST  2 VIEW

[w chest pa]
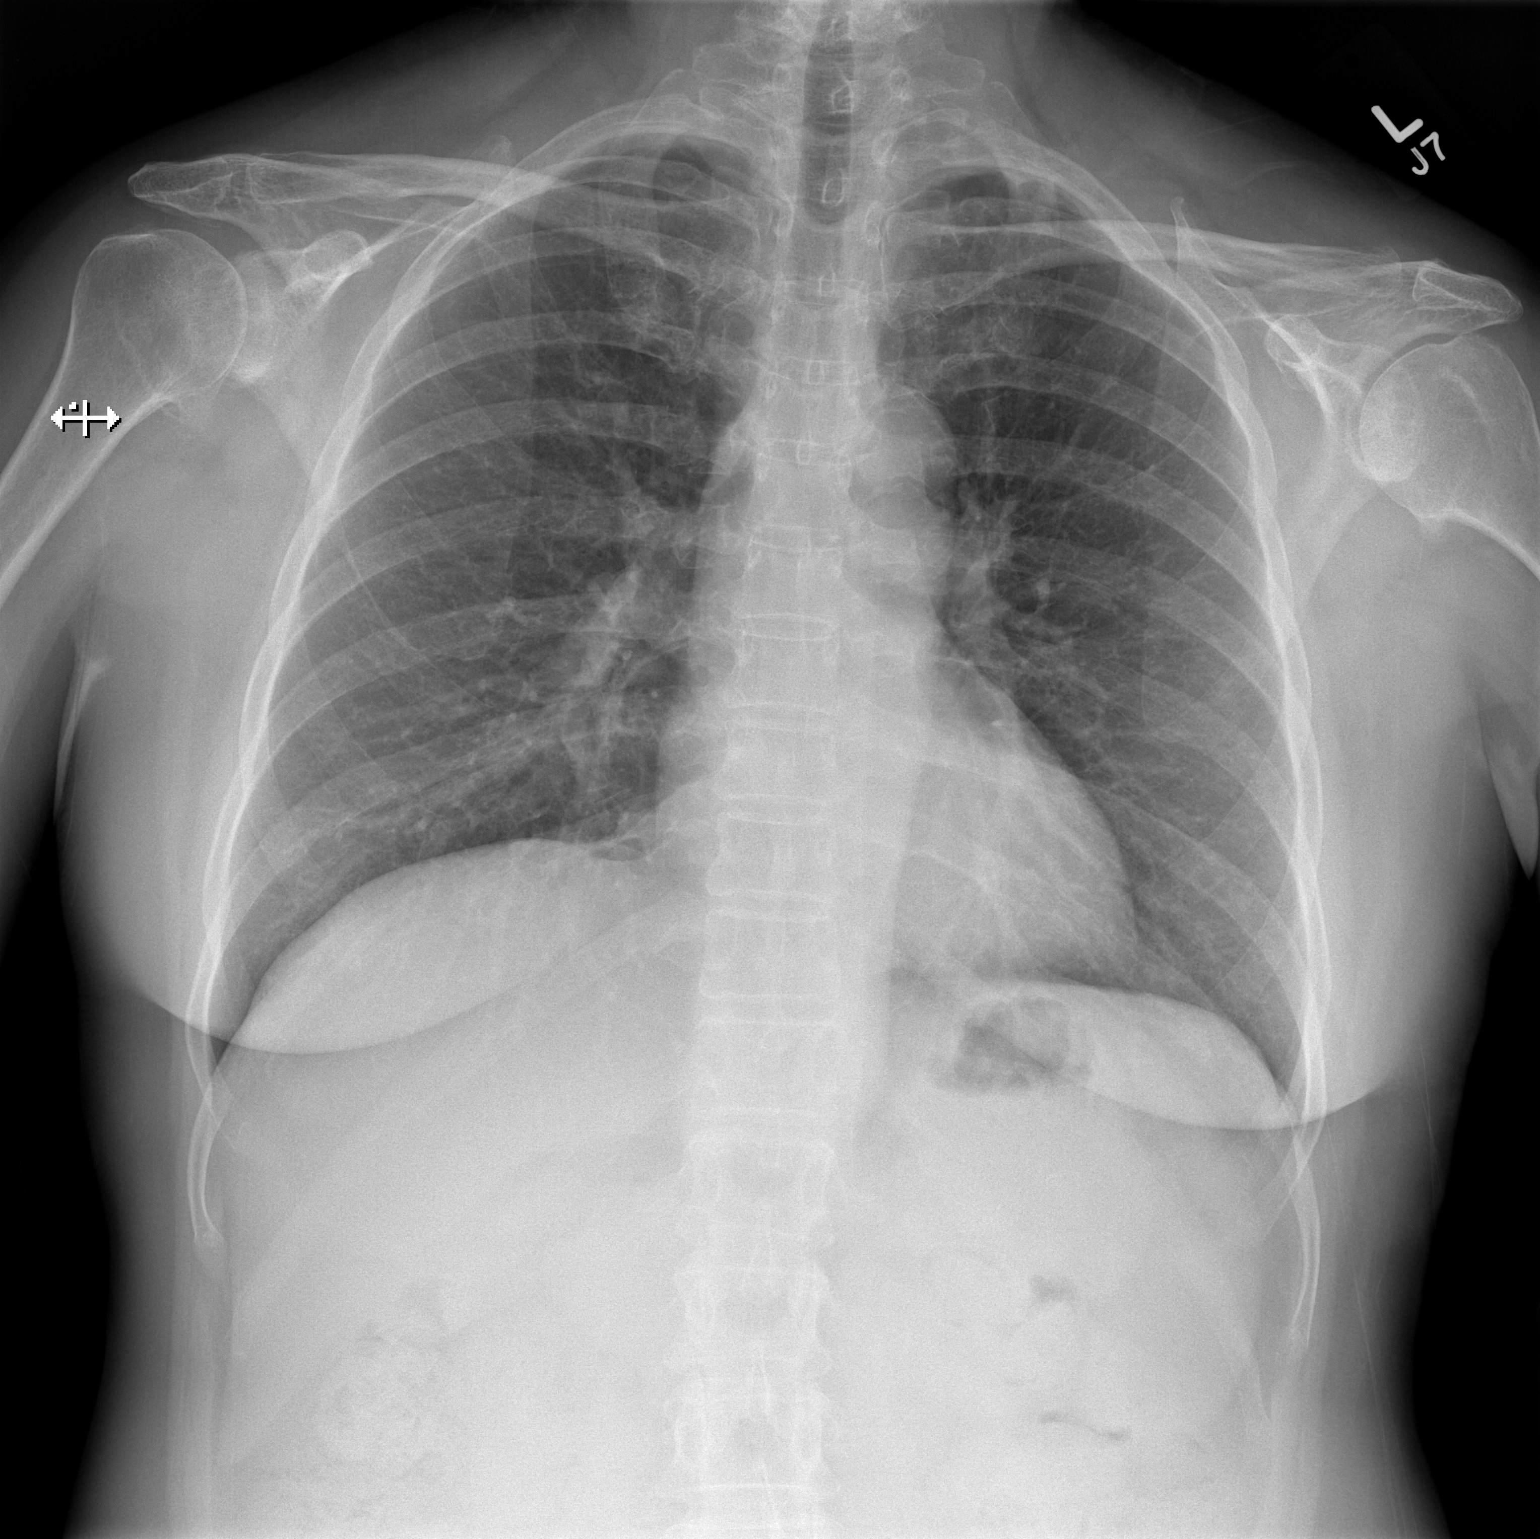

[w chest lat]
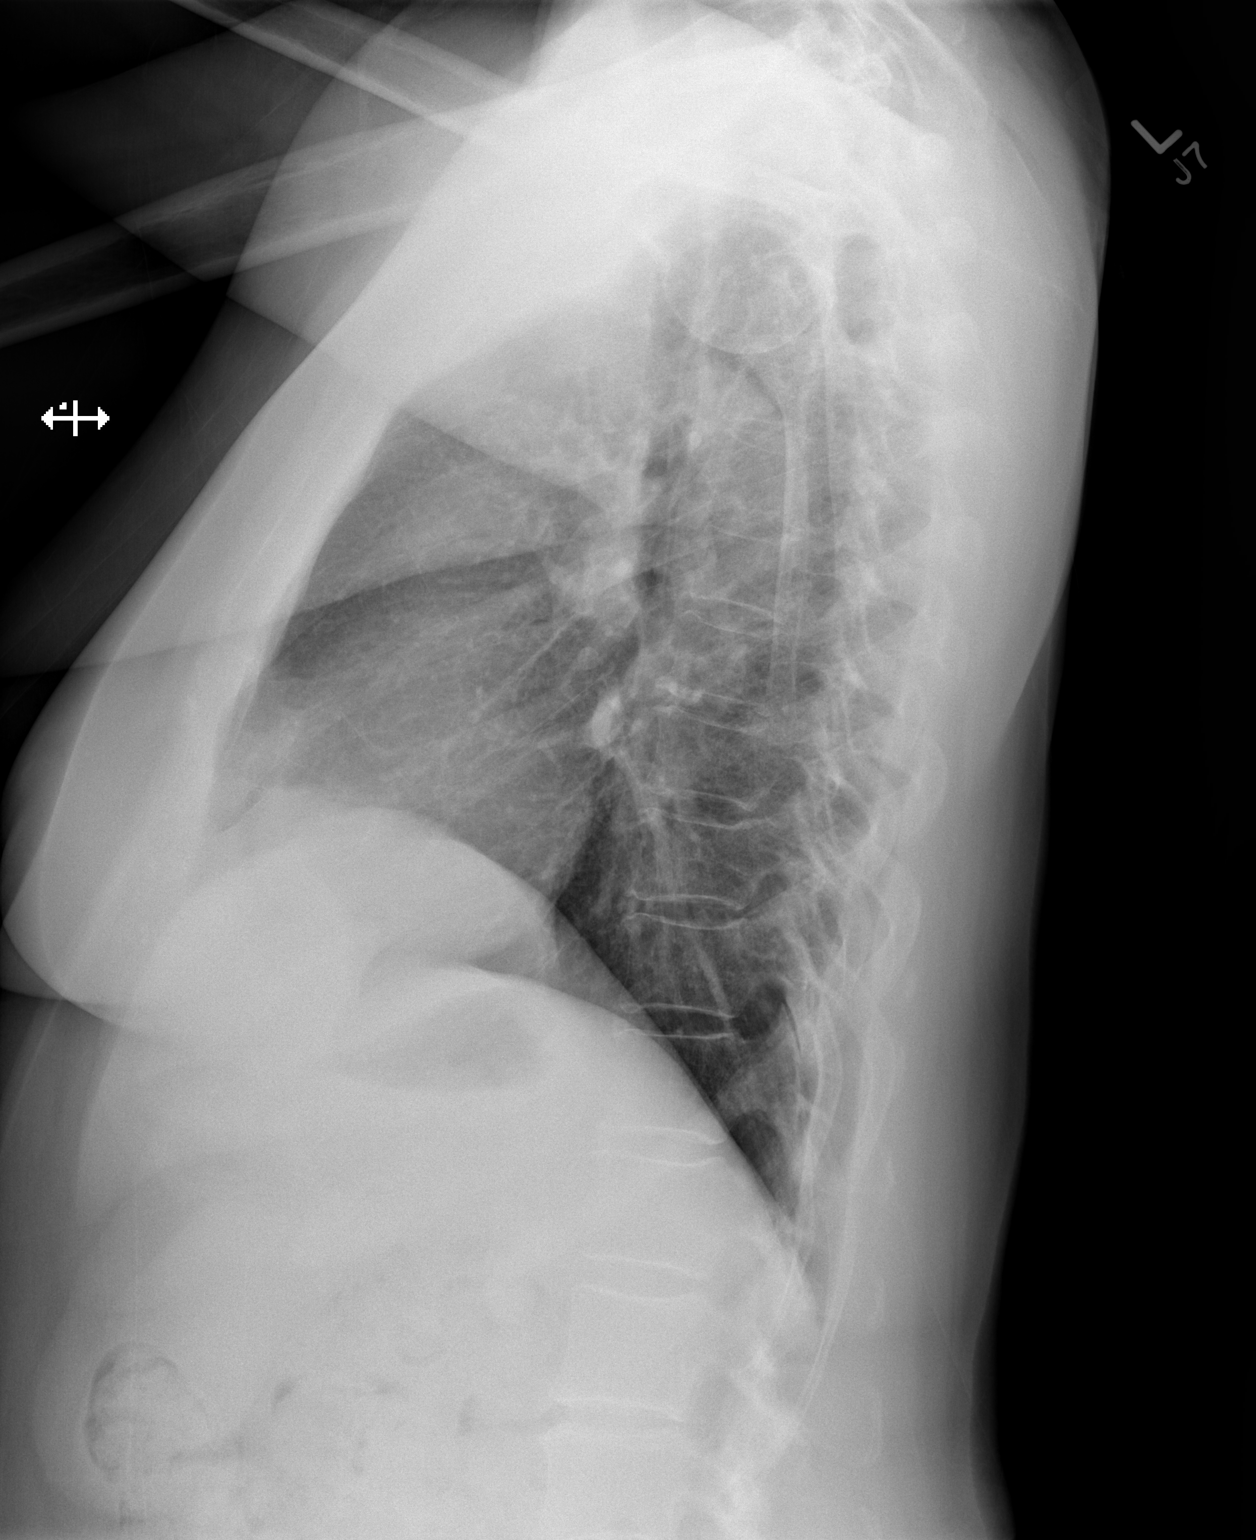

[2 of 2 positions shown; findings below may reference images not displayed]

FINDINGS: The lungs are adequately inflated. There is no focal infiltrate.
There is no pleural effusion or pneumothorax. The cardiac silhouette
is normal in size. The pulmonary vascularity is not engorged. The
mediastinum is normal in width. The trachea is midline. The thoracic
vertebral bodies are preserved in height rib visualized. There are
mild degenerative changes of both shoulders.
IMPRESSION: There is no evidence of active cardiopulmonary disease.

## 2015-04-03 IMAGING — CR DG SHOULDER 2+V PORT*R*
1 series · 1 of 1 positions shown · non-contrast
Comparison: Chest 06/30/2013

CLINICAL DATA: Total shoulder replacement

EXAM:
PORTABLE RIGHT SHOULDER - 2+ VIEW

[AP]
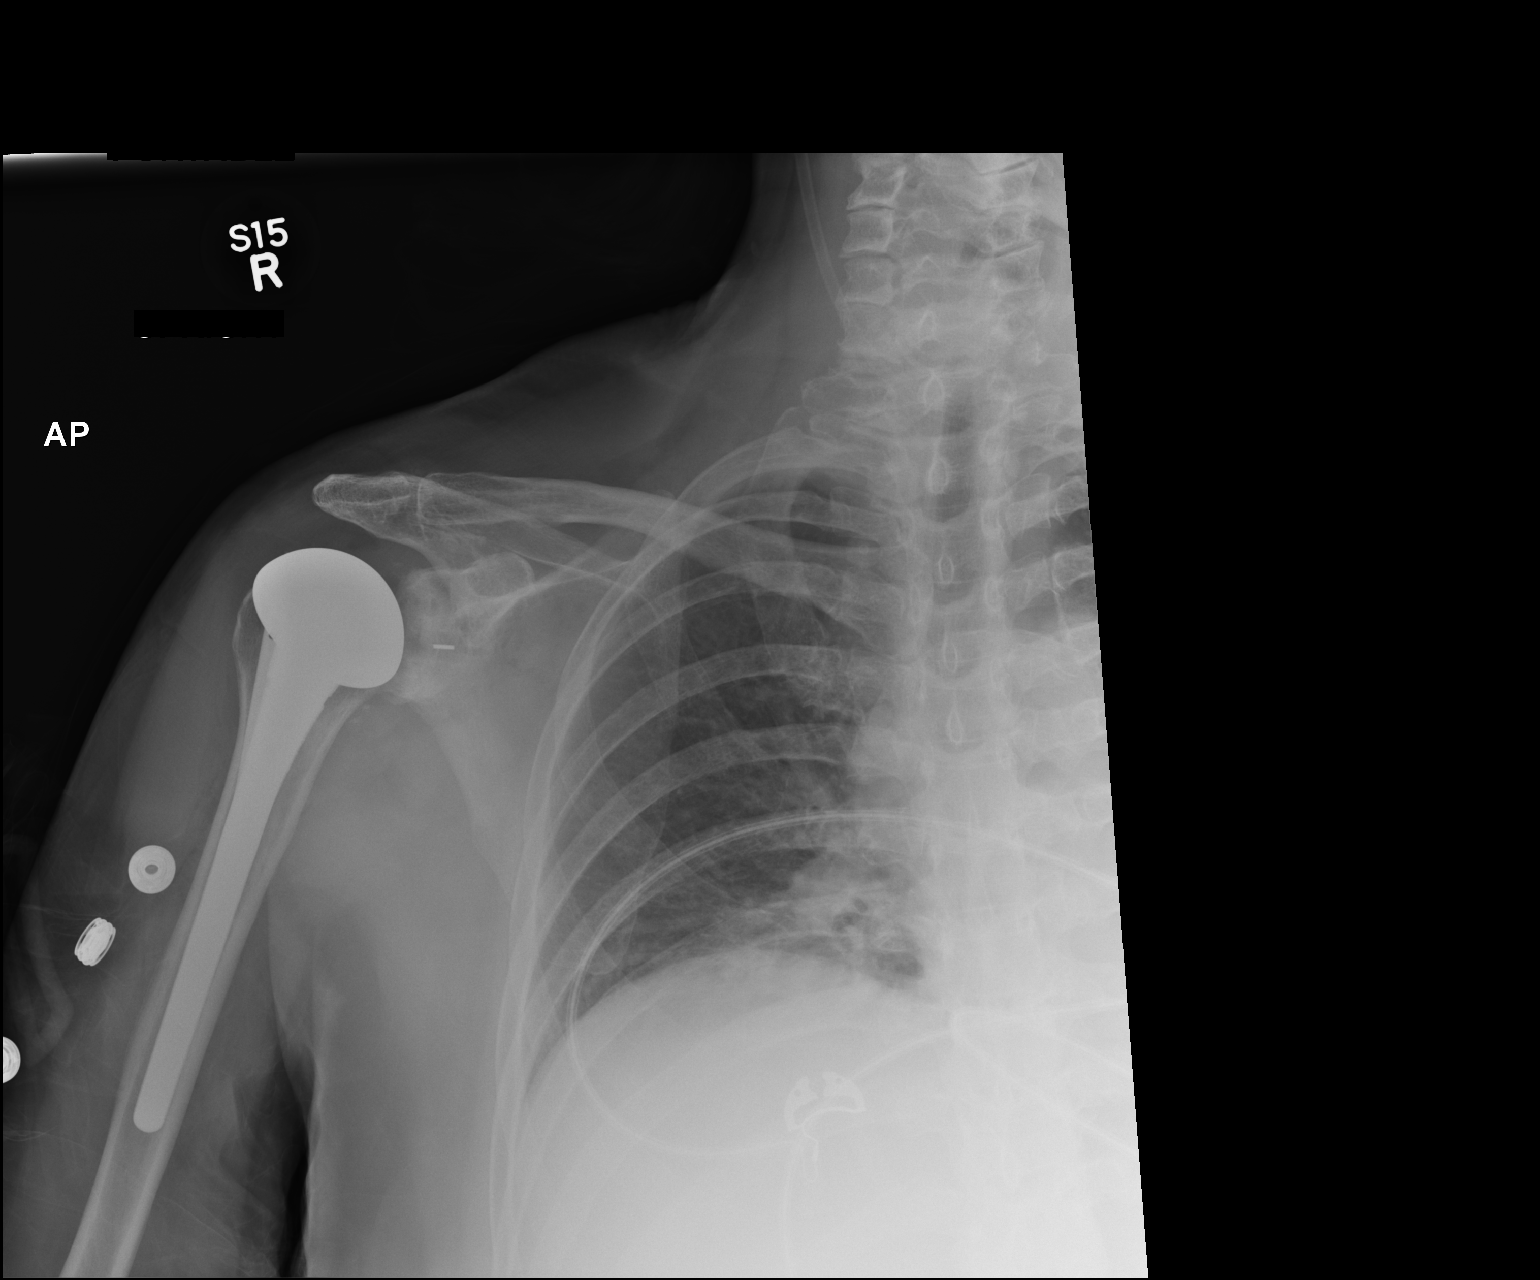

[1 of 1 positions shown; findings below may reference images not displayed]

FINDINGS: Total shoulder replacement on the right in satisfactory position and
alignment. No fracture or acute complication.
IMPRESSION: Satisfactory right shoulder replacement

Right lower lobe atelectasis.

## 2015-04-04 IMAGING — CR DG CHEST 1V
1 series · 1 of 1 positions shown · non-contrast
Comparison: 06/30/2013

CLINICAL DATA: Hypoxia, status post right shoulder arthroplasty

EXAM:
CHEST - 1 VIEW

[x chest ap]
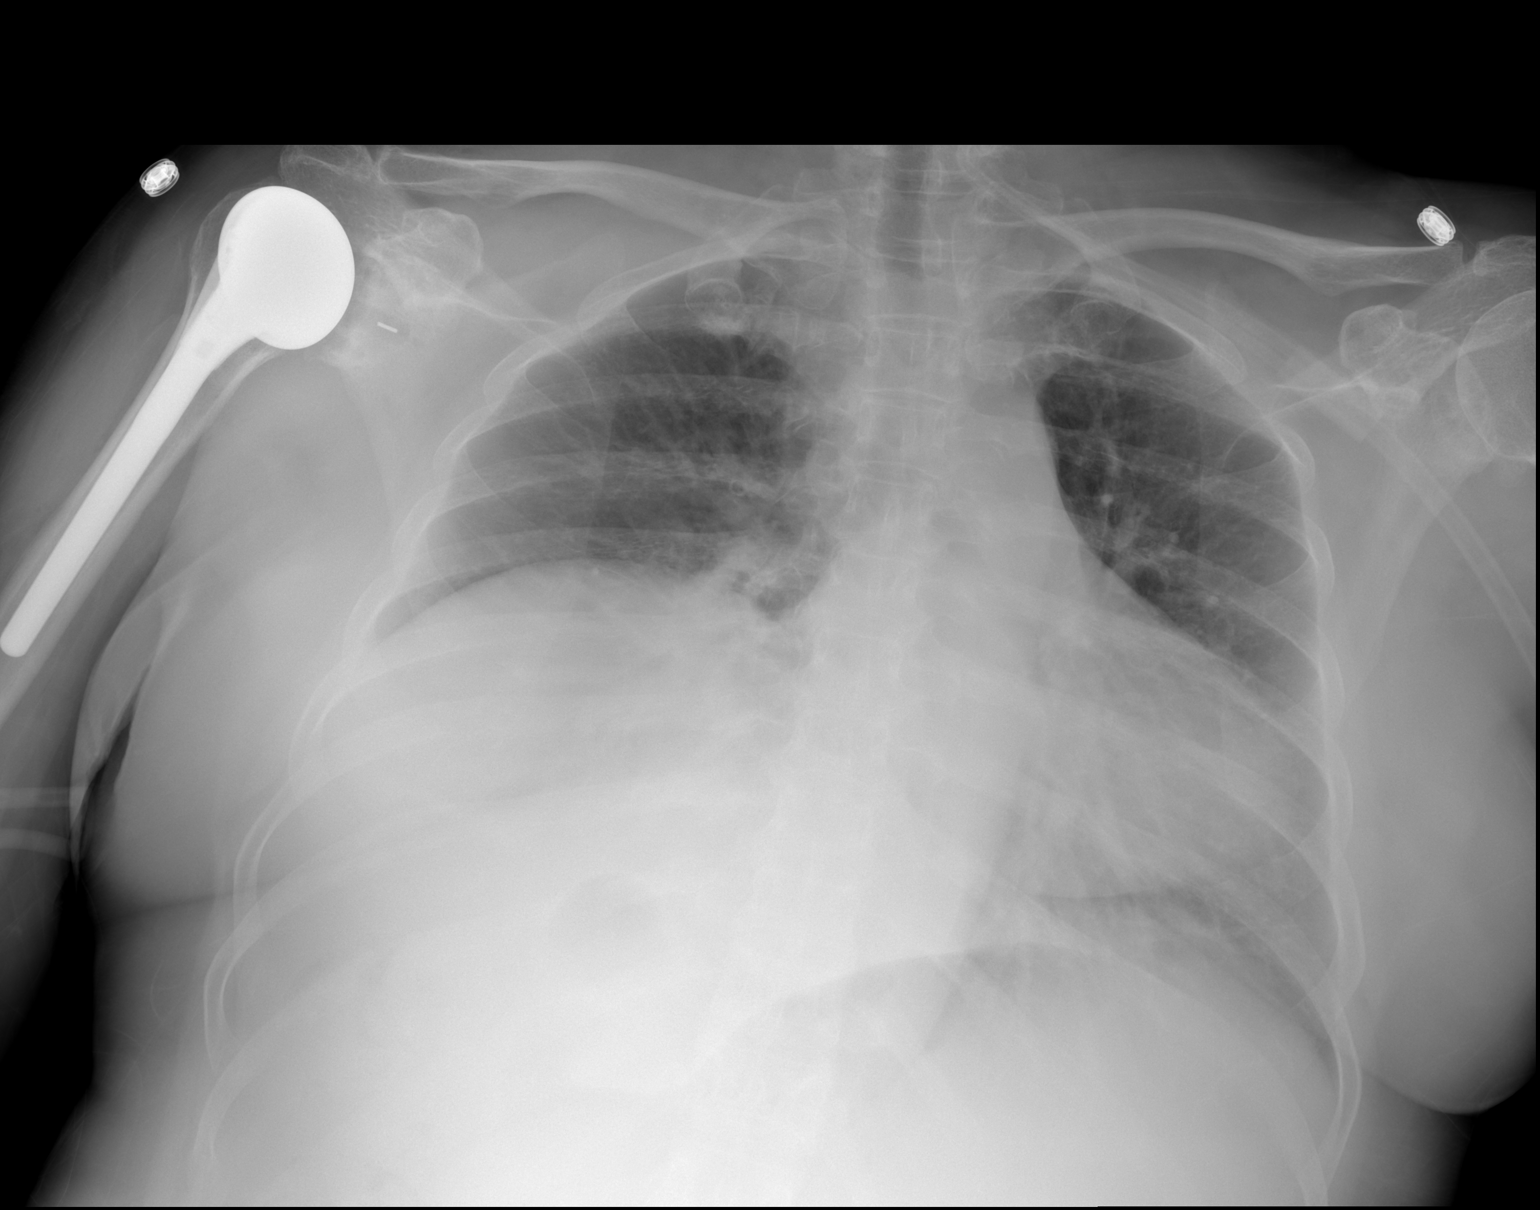

[1 of 1 positions shown; findings below may reference images not displayed]

FINDINGS: Low lung volumes with elevation of the right hemidiaphragm and
suspected right basilar atelectasis. No pleural effusion or
pneumothorax.

The heart is normal in size.

Right shoulder arthroplasty.
IMPRESSION: Low lung volumes with elevation of the right hemidiaphragm and
suspected right basilar atelectasis.

No evidence of acute cardiopulmonary disease.

## 2015-04-08 ENCOUNTER — Ambulatory Visit (INDEPENDENT_AMBULATORY_CARE_PROVIDER_SITE_OTHER): Payer: BLUE CROSS/BLUE SHIELD | Admitting: Obstetrics & Gynecology

## 2015-04-08 ENCOUNTER — Encounter: Payer: Self-pay | Admitting: Obstetrics & Gynecology

## 2015-04-08 VITALS — BP 130/82 | HR 86 | Resp 16 | Ht 63.0 in | Wt 161.0 lb

## 2015-04-08 DIAGNOSIS — Z205 Contact with and (suspected) exposure to viral hepatitis: Secondary | ICD-10-CM | POA: Diagnosis not present

## 2015-04-08 DIAGNOSIS — Z124 Encounter for screening for malignant neoplasm of cervix: Secondary | ICD-10-CM | POA: Diagnosis not present

## 2015-04-08 DIAGNOSIS — Z01419 Encounter for gynecological examination (general) (routine) without abnormal findings: Secondary | ICD-10-CM | POA: Diagnosis not present

## 2015-04-08 LAB — HEPATITIS C ANTIBODY: HCV Ab: NEGATIVE

## 2015-04-08 MED ORDER — CITALOPRAM HYDROBROMIDE 40 MG PO TABS: 40.0000 mg | ORAL_TABLET | Freq: Every day | ORAL | Status: DC

## 2015-04-08 MED ORDER — TEMAZEPAM 30 MG PO CAPS: 30.0000 mg | ORAL_CAPSULE | Freq: Every evening | ORAL | Status: DC | PRN

## 2015-04-08 MED ORDER — BUPROPION HCL ER (XL) 150 MG PO TB24: ORAL_TABLET | ORAL | Status: DC

## 2015-04-08 NOTE — Progress Notes (Signed)
64 y.o. [id number] MarriedCaucasianF here for annual exam.  Doing well.  No vaginal bleeding.  Had left shoulder replaced 03/12/15 with Dr. Veverly Fells.  He did her right shoulder in 3/15.    Patient's last menstrual period was 04/11/2003 (approximate).          Sexually active: No.  The current method of family planning is post menopausal status.    Exercising: Yes.    Gym, walking Smoker:  no  Health Maintenance: Pap:  04/2012 Neg. HR HPV:neg  History of abnormal Pap:  no MMG:  01/18/2015 BIRADS1:neg Colonoscopy:  09/03/14, Dr. Collene Mares, Every 5 years  BMD:   12/2007 Normal  TDaP:  UTD w/ PCP Screening Labs: PCP, Hb today: PCP, Urine today: PCP   reports that she has never smoked. She has never used smokeless tobacco. She reports that she drinks alcohol. She reports that she does not use illicit drugs.  Past Medical History  Diagnosis Date  . Allergy     uses Flonase daily as needed and takes Loratadine daily   . Hypothyroid   . Sjogren's syndrome ([place])   . 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 Insomnia     takes Restoril nightly as needed  . Depression     takes Celexa daily  . History of bronchitis     couple of months ago was the last time  . Arthritis   . Joint pain   . Osteoarthritis   . Joint swelling   . History of colon polyps   . Interstitial cystitis   . Urinary urgency   . Anemia     only after birth of 2nd child  . History of shingles     5-6 yrs  . Asthma     uses Albuterol daily as needed;exercise induced  . GERD (gastroesophageal reflux disease)     occ  . Spinal headache     after 1st C/S, o2sat low after shoulder surgery after went to room(51%),hard to urinate)    Past Surgical History  Procedure Laterality Date  . Cesarean section  1981/1984    x 2  . Tonsillectomy and adenoidectomy  age 75  . Carpal tunnel release Right 05/2007  . Colonoscopy    . Total shoulder arthroplasty Right 07/04/2013    DR NORRIS  . Total shoulder arthroplasty Right  07/04/2013    Procedure: RIGHT TOTAL SHOULDER ARTHROPLASTY;  Surgeon: Augustin Schooling, MD;  Location: [facility];  Service: Orthopedics;  Laterality: Right;  . Total shoulder arthroplasty Left 03/12/2015    Procedure: LEFT SHOULDER TOTAL SHOULDER ARTHROPLASTY;  Surgeon: Netta Cedars, MD;  Location: [facility];  Service: Orthopedics;  Laterality: Left;    Family History  Problem Relation Age of Onset  . Breast cancer Sister 55    stage 4, chemo and radiation  . Diabetes Brother   . Epilepsy Daughter   . Hypertension Mother   . Osteoporosis Mother   . Diabetes Father   . Hypertension Father     ROS:  Pertinent items are noted in HPI.  Otherwise, a comprehensive ROS was negative.  Exam:   BP 130/82 mmHg  Pulse 86  Resp 16  Ht 5\' 3"  (1.6 m)  Wt 161 lb (73.029 kg)  BMI 28.53 kg/m2  LMP 04/11/2003 (Approximate)    Height: 5\' 3"  (160 cm)  Ht Readings from Last 3 Encounters:  04/08/15 5\' 3"  (1.6 m)  03/12/15 5' 3.5" (1.613 m)  03/02/15 5' 3.5" (1.613  m)    General appearance: alert, cooperative and appears stated age Head: Normocephalic, without obvious abnormality, atraumatic Neck: no adenopathy, supple, symmetrical, trachea midline and thyroid normal to inspection and palpation Lungs: clear to auscultation bilaterally Breasts: normal appearance, no masses or tenderness Heart: regular rate and rhythm Abdomen: soft, non-tender; bowel sounds normal; no masses,  no organomegaly Extremities: extremities normal, atraumatic, no cyanosis or edema Skin: Skin color, texture, turgor normal. No rashes or lesions Lymph nodes: Cervical, supraclavicular, and axillary nodes normal. No abnormal inguinal nodes palpated Neurologic: Grossly normal   Pelvic: External genitalia:  no lesions              Urethra:  normal appearing urethra with no masses, tenderness or lesions              Bartholins and Skenes: normal                 Vagina: normal appearing vagina with normal color and discharge, no  lesions              Cervix: no lesions              Pap taken: Yes.   Bimanual Exam:  Uterus:  normal size, contour, position, consistency, mobility, non-tender              Adnexa: normal adnexa and no mass, fullness, tenderness               Rectovaginal: Confirms               Anus:  normal sphincter tone, no lesions  Chaperone was present for exam.  A:  Well Woman with normal exam Hyperthyroidism Hypertension H/O Sjogren's syndrome H/O depression Family hx of breast cancer with sister H/O shingles x 2 Fair skinned, sees derm yearly  P: Mammogram yearly, 3D discussed Pap today.  Neg pap with neg HR HPV 1/14 Cymbalta rx given 40mg  daily.  #30/13 RF Restoril 30mg  prn insomnia.  Pt uses rarely.  #30/1RF Wellbutrin XL 150mg  daily.  Rx to pharmacy #30/13 RF Hep C testing will be done today Other labs done with Dr. Zigmund Daniel.  She will discuss Zostavax with him at next visit. Return annually or prn

## 2015-04-09 ENCOUNTER — Telehealth: Payer: Self-pay | Admitting: Obstetrics & Gynecology

## 2015-04-09 LAB — IPS PAP TEST WITH REFLEX TO HPV

## 2015-04-09 NOTE — Telephone Encounter (Signed)
Call to patient. Advised that pap results were negative and did not show any yeast. Advised that Hep C AB testing was negative as well. Patient verbalized understanding.  Routing to provider for final review. Patient agreeable to disposition. Will close encounter.

## 2015-04-09 NOTE — Telephone Encounter (Signed)
Patient is asking to talk with Dr.Miller's nurse regarding her recent lab results.

## 2015-04-14 ENCOUNTER — Ambulatory Visit: Payer: Self-pay | Admitting: Obstetrics and Gynecology

## 2015-05-25 ENCOUNTER — Other Ambulatory Visit: Payer: Self-pay | Admitting: *Deleted

## 2015-05-25 MED ORDER — CITALOPRAM HYDROBROMIDE 40 MG PO TABS: 40.0000 mg | ORAL_TABLET | Freq: Every day | ORAL | Status: DC

## 2015-05-25 NOTE — Telephone Encounter (Signed)
Patient is requesting a 90 supply instead of 30 days at a tie to be sent to [facility]

## 2015-05-28 ENCOUNTER — Other Ambulatory Visit: Payer: Self-pay | Admitting: Obstetrics and Gynecology

## 2015-05-28 NOTE — Telephone Encounter (Signed)
Medication refill request: Wellbutrin  Last AEX:  04/08/15 MSM Next AEX: 06/13/16 MSM Refill authorized: Wellbutrin 04/08/15 #30 tabs 13 Refills  Today: Refused. Refill too soon Same pharmacy, receipt confirmed.

## 2015-06-20 ENCOUNTER — Other Ambulatory Visit: Payer: Self-pay | Admitting: Obstetrics and Gynecology

## 2015-06-25 DIAGNOSIS — L290 Pruritus ani: Secondary | ICD-10-CM | POA: Insufficient documentation

## 2015-09-13 ENCOUNTER — Ambulatory Visit (INDEPENDENT_AMBULATORY_CARE_PROVIDER_SITE_OTHER): Payer: BLUE CROSS/BLUE SHIELD | Admitting: Family Medicine

## 2015-09-13 ENCOUNTER — Ambulatory Visit (INDEPENDENT_AMBULATORY_CARE_PROVIDER_SITE_OTHER): Payer: BLUE CROSS/BLUE SHIELD

## 2015-09-13 VITALS — BP 138/80 | HR 75 | Temp 98.4°F | Resp 16 | Ht 63.0 in | Wt 161.0 lb

## 2015-09-13 DIAGNOSIS — R0789 Other chest pain: Secondary | ICD-10-CM

## 2015-09-13 DIAGNOSIS — J22 Unspecified acute lower respiratory infection: Secondary | ICD-10-CM

## 2015-09-13 DIAGNOSIS — R0981 Nasal congestion: Secondary | ICD-10-CM

## 2015-09-13 DIAGNOSIS — R51 Headache: Secondary | ICD-10-CM | POA: Diagnosis not present

## 2015-09-13 DIAGNOSIS — R519 Headache, unspecified: Secondary | ICD-10-CM

## 2015-09-13 DIAGNOSIS — R05 Cough: Secondary | ICD-10-CM

## 2015-09-13 DIAGNOSIS — J988 Other specified respiratory disorders: Secondary | ICD-10-CM

## 2015-09-13 DIAGNOSIS — R5383 Other fatigue: Secondary | ICD-10-CM | POA: Diagnosis not present

## 2015-09-13 DIAGNOSIS — R059 Cough, unspecified: Secondary | ICD-10-CM

## 2015-09-13 LAB — POCT CBC
Granulocyte percent: 58.7 %G (ref 37–80)
HEMATOCRIT: 38.4 % (ref 37.7–47.9)
HEMOGLOBIN: 13.6 g/dL (ref 12.2–16.2)
LYMPH, POC: 1.9 (ref 0.6–3.4)
MCH: 30.7 pg (ref 27–31.2)
MCHC: 35.4 g/dL (ref 31.8–35.4)
MCV: 86.7 fL (ref 80–97)
MID (CBC): 0.5 (ref 0–0.9)
MPV: 6.6 fL (ref 0–99.8)
POC GRANULOCYTE: 3.3 (ref 2–6.9)
POC LYMPH PERCENT: 33.3 %L (ref 10–50)
POC MID %: 8 % (ref 0–12)
Platelet Count, POC: 158 10*3/uL (ref 142–424)
RBC: 4.43 M/uL (ref 4.04–5.48)
RDW, POC: 13 %
WBC: 5.7 10*3/uL (ref 4.6–10.2)

## 2015-09-13 LAB — POCT INFLUENZA A/B
Influenza A, POC: NEGATIVE
Influenza B, POC: NEGATIVE

## 2015-09-13 MED ORDER — DOXYCYCLINE HYCLATE 100 MG PO TABS: 100.0000 mg | ORAL_TABLET | Freq: Two times a day (BID) | ORAL | Status: DC

## 2015-09-13 MED ORDER — BENZONATATE 100 MG PO CAPS
100.0000 mg | ORAL_CAPSULE | Freq: Three times a day (TID) | ORAL | Status: DC | PRN
Start: 2015-09-13 — End: 2016-04-17

## 2015-09-13 MED ORDER — HYDROCODONE-HOMATROPINE 5-1.5 MG/5ML PO SYRP: ORAL_SOLUTION | ORAL | Status: DC

## 2015-09-13 NOTE — Progress Notes (Signed)
Subjective:  By signing my name below, I, Raven Small, attest that this documentation has been prepared under the direction and in the presence of Merri Ray, MD.  Electronically Signed: Thea Alken, ED Scribe. 09/13/2015. 4:32 PM.    Patient ID: Christine Valdez, female    DOB: [birth year], 65 y.o.   MRN: [id number]  HPI  Chief Complaint  Patient presents with  . Cough    x 3-4 weeks  . Sore Throat  . chest congestion  . Pressure Behind the Eyes  . Fatigue  . Generalized Body Aches  . Headache    HPI Comments: Christine Valdez is a 65 y.o. female who presents to the [facility] complaining of sore throat and cough that began 3 weeks ago. Pt states symptoms started with a sore throat and cough. At that time she took decongestant and rested. Symptoms improved for a week but returned with cough, sore throat swollen gland and hoarseness. She was seen at [facility] [place] 5/21 and was diagnosed with sinus infection; treated omnicef for 10 days. At that time she also had a negative rapid strep test. Pt reports temporary improvement with omnicef.  Pt states she woke up yesterday day with a sore throat and cough with associated fatigue, chills, swollen and tender glands in neck, HA, sinus pressure, photophobia and body aches. She has taken Ibuprofen, Claritin, flonase and mucinex. She also notes a burning sensation in nose. She denies measure fever.   Pt states she has taken levaquin multiple times, but the last time she took this she broke out in a rash.   Pt is not a smoker.  Patient Active Problem List   Diagnosis Date Noted  . 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 Osteoarthrosis, unspecified whether generalized or localized, shoulder region 07/04/2013   Past Medical History  Diagnosis Date  . Allergy     uses Flonase daily as needed and takes Loratadine daily   . Hypothyroid   . Sjogren's syndrome ([place])   . 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 Insomnia     takes Restoril nightly as needed  . Depression     takes Celexa daily  . History of bronchitis     couple of months ago was the last time  . Arthritis   . Joint pain   . Osteoarthritis   . Joint swelling   . History of colon polyps   . Interstitial cystitis   . Urinary urgency   . Anemia     only after birth of 2nd child  . History of shingles     5-6 yrs  . Asthma     uses Albuterol daily as needed;exercise induced  . GERD (gastroesophageal reflux disease)     occ  . Spinal headache     after 1st C/S, o2sat low after shoulder surgery after went to room(51%),hard to urinate)   Past Surgical History  Procedure Laterality Date  . Cesarean section  1981/1984    x 2  . Tonsillectomy and adenoidectomy  age 46  . Carpal tunnel release Right 05/2007  . Colonoscopy    . Total shoulder arthroplasty Right 07/04/2013    DR NORRIS  . Total shoulder arthroplasty Right 07/04/2013    Procedure: RIGHT TOTAL SHOULDER ARTHROPLASTY;  Surgeon: Augustin Schooling, MD;  Location: [facility];  Service: Orthopedics;  Laterality: Right;  . Total shoulder arthroplasty Left 03/12/2015    Procedure:  LEFT SHOULDER TOTAL SHOULDER ARTHROPLASTY;  Surgeon: Netta Cedars, MD;  Location: [facility];  Service: Orthopedics;  Laterality: Left;   Allergies  Allergen Reactions  . Diflucan [Fluconazole] Hives    unknown  . Thimerosal Hives  . Mercury Detox [Nutritional Supplements] Hives  . Influenza Vaccine Recombinant Rash    Mercury component in flu vaccine  . Levaquin [Levofloxacin] Rash  . Sulfa Antibiotics Rash   Prior to Admission medications   Medication Sig Start Date End Date Taking? Authorizing Provider  albuterol (PROVENTIL HFA;VENTOLIN HFA) 108 (90 BASE) MCG/ACT inhaler Inhale 2 puffs into the lungs every 6 (six) hours as needed for shortness of breath.    Yes Historical Provider, MD  cholecalciferol (VITAMIN D) 1000 UNITS tablet Take 1,000 Units by mouth daily.   Yes Historical  Provider, MD  citalopram (CELEXA) 40 MG tablet Take 1 tablet (40 mg total) by mouth daily. 05/25/15  Yes Megan Salon, MD  docusate sodium (COLACE) 100 MG capsule Take 400 mg by mouth at bedtime.    Yes Historical Provider, MD  fluticasone (FLONASE) 50 MCG/ACT nasal spray Place 2 sprays into both nostrils daily as needed for allergies.   Yes Historical Provider, MD  Ketotifen Fumarate (ALAWAY OP) Place 1 drop into both eyes daily as needed (for allergies).   Yes Historical Provider, MD  levothyroxine (SYNTHROID, LEVOTHROID) 75 MCG tablet Take 75 mcg by mouth daily.   Yes Historical Provider, MD  loratadine (CLARITIN) 10 MG tablet Take 10 mg by mouth daily as needed for allergies.    Yes Historical Provider, MD  methocarbamol (ROBAXIN) 500 MG tablet Take 1 tablet (500 mg total) by mouth 3 (three) times daily as needed. 03/12/15  Yes Netta Cedars, MD  temazepam (RESTORIL) 30 MG capsule Take 1 capsule (30 mg total) by mouth at bedtime as needed for sleep. 04/08/15  Yes Megan Salon, MD   Social History   Social History  . Marital Status: Married    Spouse Name: N/A  . Number of Children: 2  . Years of Education: N/A   Occupational History  . Not on file.   Social History Main Topics  . Smoking status: Never Smoker   . Smokeless tobacco: Never Used  . Alcohol Use: 0.0 oz/week    0 Standard drinks or equivalent per week     Comment: occ wine  . Drug Use: No  . Sexual Activity:    Partners: Male    Birth Control/ Protection: Post-menopausal   Other Topics Concern  . Not on file   Social History Narrative   Review of Systems  Constitutional: Positive for chills and fatigue. Negative for fever.  HENT: Positive for congestion, ear pain, sinus pressure, sore throat and voice change.   Eyes: Positive for photophobia and pain.  Respiratory: Positive for cough and shortness of breath.   Gastrointestinal: Negative for nausea, vomiting and abdominal distention.  Musculoskeletal: Positive  for myalgias.  Skin: Negative for rash.  Neurological: Positive for headaches.  Hematological: Positive for adenopathy.    Objective:   Physical Exam  Constitutional: She is oriented to person, place, and time. She appears well-developed and well-nourished. No distress.  HENT:  Head: Normocephalic and atraumatic.  Right Ear: Hearing, tympanic membrane, external ear and ear canal normal.  Left Ear: Hearing, tympanic membrane, external ear and ear canal normal.  Nose: Right sinus exhibits no maxillary sinus tenderness. Left sinus exhibits maxillary sinus tenderness.  Mouth/Throat: Oropharynx is clear and moist. No oropharyngeal exudate.  Eyes: Conjunctivae and EOM are normal. Pupils are equal, round, and reactive to light. Right conjunctiva is not injected. Left conjunctiva is not injected. Right eye exhibits no nystagmus. Left eye exhibits no nystagmus.  Neck: No thyromegaly present.  Cardiovascular: Normal rate, regular rhythm, normal heart sounds and intact distal pulses.   No murmur heard. Pulmonary/Chest: Effort normal and breath sounds normal. No respiratory distress. She has no wheezes. She has no rhonchi.  Distant coarse distant breath sounds left lower lobe. Reproducible pain along pectoralis muscle bilaterally.   Lymphadenopathy:    She has no cervical adenopathy.  Neurological: She is alert and oriented to person, place, and time.  Skin: Skin is warm and dry. No rash noted.  Psychiatric: She has a normal mood and affect. Her behavior is normal.  Vitals reviewed.  Filed Vitals:   09/13/15 1501  BP: 138/80  Pulse: 75  Temp: 98.4 F (36.9 C)  TempSrc: Oral  Resp: 16  Height: 5\' 3"  (1.6 m)  Weight: 161 lb (73.029 kg)  SpO2: 97%   Results for orders placed or performed in visit on 09/13/15  POCT CBC  Result Value Ref Range   WBC 5.7 4.6 - 10.2 K/uL   Lymph, poc 1.9 0.6 - 3.4   POC LYMPH PERCENT 33.3 10 - 50 %L   MID (cbc) 0.5 0 - 0.9   POC MID % 8.0 0 - 12 %M   POC  Granulocyte 3.3 2 - 6.9   Granulocyte percent 58.7 37 - 80 %G   RBC 4.43 4.04 - 5.48 M/uL   Hemoglobin 13.6 12.2 - 16.2 g/dL   HCT, POC 38.4 37.7 - 47.9 %   MCV 86.7 80 - 97 fL   MCH, POC 30.7 27 - 31.2 pg   MCHC 35.4 31.8 - 35.4 g/dL   RDW, POC 13.0 %   Platelet Count, POC 158 142 - 424 K/uL   MPV 6.6 0 - 99.8 fL  POCT Influenza A/B  Result Value Ref Range   Influenza A, POC Negative Negative   Influenza B, POC Negative Negative   Dg Sinuses Complete  09/13/2015  CLINICAL DATA:  Sinus congestion and cough EXAM: PARANASAL SINUSES - COMPLETE 3 + VIEW COMPARISON:  None. FINDINGS: The frontal, maxillary, ethmoid and sphenoid sinuses are all well aerated. No air-fluid levels or mucosal changes are seen. No acute bony abnormality is noted. IMPRESSION: No acute abnormality seen. Electronically Signed   By: Inez Catalina M.D.   On: 09/13/2015 17:19   Dg Chest 2 View  09/13/2015  CLINICAL DATA:  Cough and chest pain today. EXAM: CHEST  2 VIEW COMPARISON:  07/05/2013 FINDINGS: The cardiac silhouette, mediastinal and hilar contours are within normal limits. Lungs are clear. No pleural effusion. Bony thorax is intact. IMPRESSION: No acute cardiopulmonary findings. Electronically Signed   By: Marijo Sanes M.D.   On: 09/13/2015 17:24    Assessment & Plan:   Christine Valdez is a 65 y.o. female Sinus congestion - Plan: DG Sinuses Complete  Acute nonintractable headache, unspecified headache type - Plan: DG Sinuses Complete, POCT Influenza A/B  Cough - Plan: DG Chest 2 View, POCT CBC, POCT Influenza A/B, HYDROcodone-homatropine (HYCODAN) 5-1.5 MG/5ML syrup, benzonatate (TESSALON) 100 MG capsule  Chest wall pain - Plan: DG Chest 2 View, POCT CBC, POCT Influenza A/B  Other fatigue - Plan: Epstein-Barr virus VCA antibody panel  LRTI (lower respiratory tract infection) - Plan: doxycycline (VIBRA-TABS) 100 MG tablet  Possible recurrent viral illnesses versus  lower respiratory tract  infection/bronchitis. Reassuring CBC, chest x-ray, sinus x-ray. With worsening after completion of previous antibiotic, may have some atypical bacterial component. Left maxillary sinus tender, but no sign of air-fluid levels on x-rays.  - Will start doxycycline for lower respiratory infection/bronchitis, and possible early sinus infection, but if worsening symptoms, may need further evaluation, possibly CT sinuses.  - check EBV titer given previous lymphadenopathy and fatigue.  -Tessalon as needed for cough during the day, fluids, rest, and Hycodan at night if needed.  - RTC precautions.  Meds ordered this encounter  Medications  . HYDROcodone-homatropine (HYCODAN) 5-1.5 MG/5ML syrup    Sig: 46m by mouth a bedtime as needed for cough.    Dispense:  120 mL    Refill:  0  . benzonatate (TESSALON) 100 MG capsule    Sig: Take 1 capsule (100 mg total) by mouth 3 (three) times daily as needed for cough.    Dispense:  20 capsule    Refill:  0  . doxycycline (VIBRA-TABS) 100 MG tablet    Sig: Take 1 tablet (100 mg total) by mouth 2 (two) times daily.    Dispense:  20 tablet    Refill:  0   Patient Instructions       IF you received an x-ray today, you will receive an invoice from [facility]. Please contact [facility] at [phone] with questions or concerns regarding your invoice.   IF you received labwork today, you will receive an invoice from [employer]. Please contact [facility] at [phone] with questions or concerns regarding your invoice.   Our billing staff will not be able to assist you with questions regarding bills from these companies.  You will be contacted with the lab results as soon as they are available. The fastest way to get your results is to activate your My Chart account. Instructions are located on the last page of this paperwork. If you have not heard from [place] regarding the results in 2 weeks, please contact this  office.    Tessalon Perles as needed for cough during the day, fluids, relative rest. Hydrocodone cough syrup at night if needed. Saline nasal spray and drinking plenty of fluids can help with the nasal congestion and pressure. You may have an early bronchitis, and will start doxycycline for this. The x-rays of your chest and sinuses were overall reassuring. I also checked a mono test, and that should be back in the next 1-2 weeks. If you are not improving into later this week or early next week, or any worsening sooner, return for recheck.  Return to the clinic or go to the nearest emergency room if any of your symptoms worsen or new symptoms occur.  Acute Bronchitis Bronchitis is inflammation of the airways that extend from the windpipe into the lungs (bronchi). The inflammation often causes mucus to develop. This leads to a cough, which is the most common symptom of bronchitis.  In acute bronchitis, the condition usually develops suddenly and goes away over time, usually in a couple weeks. Smoking, allergies, and asthma can make bronchitis worse. Repeated episodes of bronchitis may cause further lung problems.  CAUSES Acute bronchitis is most often caused by the same virus that causes a cold. The virus can spread from person to person (contagious) through coughing, sneezing, and touching contaminated objects. SIGNS AND SYMPTOMS   Cough.   Fever.   Coughing up mucus.   Body aches.   Chest congestion.   Chills.  Shortness of breath.   Sore throat.  DIAGNOSIS  Acute bronchitis is usually diagnosed through a physical exam. Your health care provider will also ask you questions about your medical history. Tests, such as chest X-rays, are sometimes done to rule out other conditions.  TREATMENT  Acute bronchitis usually goes away in a couple weeks. Oftentimes, no medical treatment is necessary. Medicines are sometimes given for relief of fever or cough. Antibiotic medicines are  usually not needed but may be prescribed in certain situations. In some cases, an inhaler may be recommended to help reduce shortness of breath and control the cough. A cool mist vaporizer may also be used to help thin bronchial secretions and make it easier to clear the chest.  HOME CARE INSTRUCTIONS  Get plenty of rest.   Drink enough fluids to keep your urine clear or pale yellow (unless you have a medical condition that requires fluid restriction). Increasing fluids may help thin your respiratory secretions (sputum) and reduce chest congestion, and it will prevent dehydration.   Take medicines only as directed by your health care provider.  If you were prescribed an antibiotic medicine, finish it all even if you start to feel better.  Avoid smoking and secondhand smoke. Exposure to cigarette smoke or irritating chemicals will make bronchitis worse. If you are a smoker, consider using nicotine gum or skin patches to help control withdrawal symptoms. Quitting smoking will help your lungs heal faster.   Reduce the chances of another bout of acute bronchitis by washing your hands frequently, avoiding people with cold symptoms, and trying not to touch your hands to your mouth, nose, or eyes.   Keep all follow-up visits as directed by your health care provider.  SEEK MEDICAL CARE IF: Your symptoms do not improve after 1 week of treatment.  SEEK IMMEDIATE MEDICAL CARE IF:  You develop an increased fever or chills.   You have chest pain.   You have severe shortness of breath.  You have bloody sputum.   You develop dehydration.  You faint or repeatedly feel like you are going to pass out.  You develop repeated vomiting.  You develop a severe headache. MAKE SURE YOU:   Understand these instructions.  Will watch your condition.  Will get help right away if you are not doing well or get worse.   This information is not intended to replace advice given to you by your health  care provider. Make sure you discuss any questions you have with your health care provider.   Document Released: 05/04/2004 Document Revised: 04/17/2014 Document Reviewed: 09/17/2012 Elsevier Interactive Patient Education 2016 Elsevier [employer]. Cough, Adult Coughing is a reflex that clears your throat and your airways. Coughing helps to heal and protect your lungs. It is normal to cough occasionally, but a cough that happens with other symptoms or lasts a long time may be a sign of a condition that needs treatment. A cough may last only 2-3 weeks (acute), or it may last longer than 8 weeks (chronic). CAUSES Coughing is commonly caused by:  Breathing in substances that irritate your lungs.  A viral or bacterial respiratory infection.  Allergies.  Asthma.  Postnasal drip.  Smoking.  Acid backing up from the stomach into the esophagus (gastroesophageal reflux).  Certain medicines.  Chronic lung problems, including COPD (or rarely, lung cancer).  Other medical conditions such as heart failure. HOME CARE INSTRUCTIONS  Pay attention to any changes in your symptoms. Take these actions to help with your discomfort:  Take medicines only as told by your health care provider.  If you were prescribed an antibiotic medicine, take it as told by your health care provider. Do not stop taking the antibiotic even if you start to feel better.  Talk with your health care provider before you take a cough suppressant medicine.  Drink enough fluid to keep your urine clear or pale yellow.  If the air is dry, use a cold steam vaporizer or humidifier in your bedroom or your home to help loosen secretions.  Avoid anything that causes you to cough at work or at home.  If your cough is worse at night, try sleeping in a semi-upright position.  Avoid cigarette smoke. If you smoke, quit smoking. If you need help quitting, ask your health care provider.  Avoid caffeine.  Avoid alcohol.  Rest as  needed. SEEK MEDICAL CARE IF:   You have new symptoms.  You cough up pus.  Your cough does not get better after 2-3 weeks, or your cough gets worse.  You cannot control your cough with suppressant medicines and you are losing sleep.  You develop pain that is getting worse or pain that is not controlled with pain medicines.  You have a fever.  You have unexplained weight loss.  You have night sweats. SEEK IMMEDIATE MEDICAL CARE IF:  You cough up blood.  You have difficulty breathing.  Your heartbeat is very fast.   This information is not intended to replace advice given to you by your health care provider. Make sure you discuss any questions you have with your health care provider.   Document Released: 09/23/2010 Document Revised: 12/16/2014 Document Reviewed: 06/03/2014 Elsevier Interactive Patient Education [employer].     I personally performed the services described in this documentation, which was scribed in my presence. The recorded information has been reviewed and considered, and addended by me as needed.   Signed,   Merri Ray, MD [facility].  09/13/2015 5:57 PM

## 2015-09-13 NOTE — Patient Instructions (Addendum)
IF you received an x-ray today, you will receive an invoice from [facility]. Please contact [facility] at [phone] with questions or concerns regarding your invoice.   IF you received labwork today, you will receive an invoice from [employer]. Please contact [facility] at [phone] with questions or concerns regarding your invoice.   Our billing staff will not be able to assist you with questions regarding bills from these companies.  You will be contacted with the lab results as soon as they are available. The fastest way to get your results is to activate your My Chart account. Instructions are located on the last page of this paperwork. If you have not heard from [place] regarding the results in 2 weeks, please contact this office.    Tessalon Perles as needed for cough during the day, fluids, relative rest. Hydrocodone cough syrup at night if needed. Saline nasal spray and drinking plenty of fluids can help with the nasal congestion and pressure. You may have an early bronchitis, and will start doxycycline for this. The x-rays of your chest and sinuses were overall reassuring. I also checked a mono test, and that should be back in the next 1-2 weeks. If you are not improving into later this week or early next week, or any worsening sooner, return for recheck.  Return to the clinic or go to the nearest emergency room if any of your symptoms worsen or new symptoms occur.  Acute Bronchitis Bronchitis is inflammation of the airways that extend from the windpipe into the lungs (bronchi). The inflammation often causes mucus to develop. This leads to a cough, which is the most common symptom of bronchitis.  In acute bronchitis, the condition usually develops suddenly and goes away over time, usually in a couple weeks. Smoking, allergies, and asthma can make bronchitis worse. Repeated episodes of bronchitis may cause further lung problems.   CAUSES Acute bronchitis is most often caused by the same virus that causes a cold. The virus can spread from person to person (contagious) through coughing, sneezing, and touching contaminated objects. SIGNS AND SYMPTOMS   Cough.   Fever.   Coughing up mucus.   Body aches.   Chest congestion.   Chills.   Shortness of breath.   Sore throat.  DIAGNOSIS  Acute bronchitis is usually diagnosed through a physical exam. Your health care provider will also ask you questions about your medical history. Tests, such as chest X-rays, are sometimes done to rule out other conditions.  TREATMENT  Acute bronchitis usually goes away in a couple weeks. Oftentimes, no medical treatment is necessary. Medicines are sometimes given for relief of fever or cough. Antibiotic medicines are usually not needed but may be prescribed in certain situations. In some cases, an inhaler may be recommended to help reduce shortness of breath and control the cough. A cool mist vaporizer may also be used to help thin bronchial secretions and make it easier to clear the chest.  HOME CARE INSTRUCTIONS  Get plenty of rest.   Drink enough fluids to keep your urine clear or pale yellow (unless you have a medical condition that requires fluid restriction). Increasing fluids may help thin your respiratory secretions (sputum) and reduce chest congestion, and it will prevent dehydration.   Take medicines only as directed by your health care provider.  If you were prescribed an antibiotic medicine, finish it all even if you start to feel better.  Avoid smoking and secondhand smoke. Exposure to cigarette  smoke or irritating chemicals will make bronchitis worse. If you are a smoker, consider using nicotine gum or skin patches to help control withdrawal symptoms. Quitting smoking will help your lungs heal faster.   Reduce the chances of another bout of acute bronchitis by washing your hands frequently, avoiding people  with cold symptoms, and trying not to touch your hands to your mouth, nose, or eyes.   Keep all follow-up visits as directed by your health care provider.  SEEK MEDICAL CARE IF: Your symptoms do not improve after 1 week of treatment.  SEEK IMMEDIATE MEDICAL CARE IF:  You develop an increased fever or chills.   You have chest pain.   You have severe shortness of breath.  You have bloody sputum.   You develop dehydration.  You faint or repeatedly feel like you are going to pass out.  You develop repeated vomiting.  You develop a severe headache. MAKE SURE YOU:   Understand these instructions.  Will watch your condition.  Will get help right away if you are not doing well or get worse.   This information is not intended to replace advice given to you by your health care provider. Make sure you discuss any questions you have with your health care provider.   Document Released: 05/04/2004 Document Revised: 04/17/2014 Document Reviewed: 09/17/2012 Elsevier Interactive Patient Education 2016 Elsevier Inc. Cough, Adult Coughing is a reflex that clears your throat and your airways. Coughing helps to heal and protect your lungs. It is normal to cough occasionally, but a cough that happens with other symptoms or lasts a long time may be a sign of a condition that needs treatment. A cough may last only 2-3 weeks (acute), or it may last longer than 8 weeks (chronic). CAUSES Coughing is commonly caused by:  Breathing in substances that irritate your lungs.  A viral or bacterial respiratory infection.  Allergies.  Asthma.  Postnasal drip.  Smoking.  Acid backing up from the stomach into the esophagus (gastroesophageal reflux).  Certain medicines.  Chronic lung problems, including COPD (or rarely, lung cancer).  Other medical conditions such as heart failure. HOME CARE INSTRUCTIONS  Pay attention to any changes in your symptoms. Take these actions to help with your  discomfort:  Take medicines only as told by your health care provider.  If you were prescribed an antibiotic medicine, take it as told by your health care provider. Do not stop taking the antibiotic even if you start to feel better.  Talk with your health care provider before you take a cough suppressant medicine.  Drink enough fluid to keep your urine clear or pale yellow.  If the air is dry, use a cold steam vaporizer or humidifier in your bedroom or your home to help loosen secretions.  Avoid anything that causes you to cough at work or at home.  If your cough is worse at night, try sleeping in a semi-upright position.  Avoid cigarette smoke. If you smoke, quit smoking. If you need help quitting, ask your health care provider.  Avoid caffeine.  Avoid alcohol.  Rest as needed. SEEK MEDICAL CARE IF:   You have new symptoms.  You cough up pus.  Your cough does not get better after 2-3 weeks, or your cough gets worse.  You cannot control your cough with suppressant medicines and you are losing sleep.  You develop pain that is getting worse or pain that is not controlled with pain medicines.  You have a fever.  You  have unexplained weight loss.  You have night sweats. SEEK IMMEDIATE MEDICAL CARE IF:  You cough up blood.  You have difficulty breathing.  Your heartbeat is very fast.   This information is not intended to replace advice given to you by your health care provider. Make sure you discuss any questions you have with your health care provider.   Document Released: 09/23/2010 Document Revised: 12/16/2014 Document Reviewed: 06/03/2014 Elsevier Interactive Patient Education [employer].

## 2015-09-14 LAB — EPSTEIN-BARR VIRUS VCA ANTIBODY PANEL
EBV EA IgG: <5 U/mL (ref <9.0)
EBV NA IgG: 526 U/mL — ABNORMAL HIGH (ref <18.0)
EBV VCA IgG: >750 U/mL — ABNORMAL HIGH (ref <18.0)
EBV VCA IgM: 16.5 U/mL (ref <36.0)

## 2015-10-04 ENCOUNTER — Other Ambulatory Visit: Payer: Self-pay | Admitting: Obstetrics & Gynecology

## 2015-10-04 NOTE — Telephone Encounter (Signed)
Medication refill request: Temazepam 30mg  Last AEX:  04/08/15 SM Next AEX: 06/13/16 Last MMG (if hormonal medication request): 01/18/15 [id number] Refill authorized: Temazepam 30mg  #30 1R. Please advise. Thank you.

## 2015-12-30 ENCOUNTER — Telehealth: Payer: Self-pay | Admitting: Obstetrics & Gynecology

## 2015-12-30 ENCOUNTER — Encounter: Payer: Self-pay | Admitting: Obstetrics & Gynecology

## 2015-12-30 NOTE — Telephone Encounter (Signed)
Left message to call Sharee Pimple back at [phone].

## 2015-12-30 NOTE — Telephone Encounter (Signed)
Patient is having a lot of stress with her sister dying and family discord. Patient is asking for an anxiety prescription while she is dealing with these issues. Confirmed pharmacy on file.

## 2015-12-31 ENCOUNTER — Other Ambulatory Visit: Payer: Self-pay | Admitting: Obstetrics & Gynecology

## 2015-12-31 MED ORDER — ALPRAZOLAM 0.5 MG PO TABS: 0.5000 mg | ORAL_TABLET | Freq: Three times a day (TID) | ORAL | 0 refills | Status: DC | PRN

## 2015-12-31 NOTE — Telephone Encounter (Signed)
Patient returned call and also sent MyChart message, see below. Patient states experiencing increased anxiety due to the passing of her sister unexpectedly. Requesting medication to "keep her calm". Patient currently taking Celexa daily for depression. Patient denies suicidal ideations and reports her immediate family provides support. Advised patient Dr. Sabra Heck doesn't typically prescribe these types of medications. Advised will review with Dr. Sabra Heck and if any additional recommendations, will return call.  Provided patient with Psychiatrist in area to follow-up with: Dr. Clovis Pu at [facility] and Dr. Caprice Beaver. Patient is agreeable.    Dr. Sabra Heck, any additional recommendations?      From Enzo Montgomery To Megan Salon, MD Sent 12/30/2015 11:38 PM  Hi Dr. Sabra Heck. Hope you are doing well. I missed a call from [place] this afternoon. So sorry. It was in regard to requesting a prescription for a RX to help with some anxiety I am experiencing. My sister died and I was informed via text message. Bad form! Please have Sharee Pimple call me tomorrow on my cell at [phone]. Thank you and have a good day!   Christine Valdez  DOB Jul 30, 2050   PS This has been extremely tough to deal with. I am contacting my insurance company to see if I have coverage to see a therapist to help me deal with this. Dr Sabra Heck, could you please give me a few names for a therapist. Thank you.

## 2015-12-31 NOTE — Telephone Encounter (Signed)
See telephone encounter dated 12/31/15.

## 2015-12-31 NOTE — Telephone Encounter (Signed)
Called pt personally.  Rx for xanax 0.5mg  q 8 hrs prn anxiety/insomnia.  #30/0RF.  Addictive potential reviewed.  Almyra Free Whitt's name given as a Engineer, water.  OK to close encounter.

## 2016-01-03 ENCOUNTER — Telehealth: Payer: Self-pay | Admitting: *Deleted

## 2016-01-04 ENCOUNTER — Other Ambulatory Visit: Payer: Self-pay | Admitting: Obstetrics & Gynecology

## 2016-01-04 DIAGNOSIS — Z1231 Encounter for screening mammogram for malignant neoplasm of breast: Secondary | ICD-10-CM

## 2016-01-05 NOTE — Telephone Encounter (Signed)
Spoke with patient. Patient calling to reference previous telephone conversation with Dr. Sabra Heck. Patient wanted to verify type of therapy she needed to request from insurance. Reviewed with Dr. Sabra Heck, advised patient Behavioral or Psychological Therapy. Patient verbalizes understanding and patient agreeable.   Routing to provider for final review. Patient is agreeable to disposition. Will close encounter.

## 2016-01-24 ENCOUNTER — Ambulatory Visit
Admission: RE | Admit: 2016-01-24 | Discharge: 2016-01-24 | Disposition: A | Discharge status: Home / Self Care | Payer: BLUE CROSS/BLUE SHIELD | Source: Ambulatory Visit | Attending: Obstetrics & Gynecology | Admitting: Obstetrics & Gynecology

## 2016-01-24 DIAGNOSIS — Z1231 Encounter for screening mammogram for malignant neoplasm of breast: Secondary | ICD-10-CM

## 2016-01-31 ENCOUNTER — Ambulatory Visit: Payer: BLUE CROSS/BLUE SHIELD | Admitting: Licensed Clinical Social Worker

## 2016-02-07 ENCOUNTER — Ambulatory Visit: Payer: BLUE CROSS/BLUE SHIELD | Admitting: Licensed Clinical Social Worker

## 2016-03-04 ENCOUNTER — Other Ambulatory Visit: Payer: Self-pay | Admitting: Obstetrics & Gynecology

## 2016-03-06 NOTE — Telephone Encounter (Signed)
Medication refill request: Restoril  Last AEX:  04-08-15  Next AEX: 04-11-16  Last MMG (if hormonal medication request): 01-26-16 WNL  Refill authorized: please advise

## 2016-04-11 ENCOUNTER — Ambulatory Visit: Payer: Self-pay | Admitting: Obstetrics & Gynecology

## 2016-04-17 ENCOUNTER — Ambulatory Visit (INDEPENDENT_AMBULATORY_CARE_PROVIDER_SITE_OTHER): Payer: BLUE CROSS/BLUE SHIELD | Admitting: Obstetrics & Gynecology

## 2016-04-17 ENCOUNTER — Encounter: Payer: Self-pay | Admitting: Obstetrics & Gynecology

## 2016-04-17 VITALS — BP 112/64 | HR 82 | Resp 14 | Ht 63.25 in | Wt 158.4 lb

## 2016-04-17 DIAGNOSIS — Z01419 Encounter for gynecological examination (general) (routine) without abnormal findings: Secondary | ICD-10-CM

## 2016-04-17 DIAGNOSIS — Z124 Encounter for screening for malignant neoplasm of cervix: Secondary | ICD-10-CM

## 2016-04-17 MED ORDER — TEMAZEPAM 30 MG PO CAPS: 30.0000 mg | ORAL_CAPSULE | Freq: Every evening | ORAL | 1 refills | Status: DC | PRN

## 2016-04-17 MED ORDER — CITALOPRAM HYDROBROMIDE 40 MG PO TABS: 40.0000 mg | ORAL_TABLET | Freq: Every day | ORAL | 4 refills | Status: DC

## 2016-04-17 NOTE — Patient Instructions (Addendum)
Christine Valdez [name]

## 2016-04-17 NOTE — Progress Notes (Signed)
66 y.o. [id number] MarriedCaucasianF here for annual exam.  Doing well.  No vaginal bleeding.  Son just got married last fall.  He is going to be a [employer].  Would like new PCP name.  Patient's last menstrual period was 04/11/2003 (approximate).          Sexually active: No.  The current method of family planning is post menopausal status.    Exercising: No.  The patient does not participate in regular exercise at present. Smoker:  no  Health Maintenance: Pap:  04/08/15 negative History of abnormal Pap:  no MMG:  01/26/16 BIRADS 1 negative  Colonoscopy:  08/19/14 polyps- repeat 5 years  BMD:   12/20/07  TDaP:  UTD with PCP Pneumonia vaccine(s):  2016 Zostavax:   never Hep C testing: 04/08/15 negative  Screening Labs: PCP, Hb today: PCP, Urine today: PCP   reports that she has never smoked. She has never used smokeless tobacco. She reports that she drinks alcohol. She reports that she does not use drugs.  Past Medical History:  Diagnosis Date  . Allergy    uses Flonase daily as needed and takes Loratadine daily   . Anemia    only after birth of 2nd child  . Arthritis   . Asthma    uses Albuterol daily as needed;exercise induced  . 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 Depression    takes Celexa daily  . GERD (gastroesophageal reflux disease)    occ  . History of bronchitis    couple of months ago was the last time  . History of colon polyps   . History of shingles    5-6 yrs  . Hypothyroid   . Insomnia    takes Restoril nightly as needed  . Interstitial cystitis   . Joint pain   . Joint swelling   . Osteoarthritis   . Sjogren's syndrome ([place])   . Spinal headache    after 1st C/S, o2sat low after shoulder surgery after went to room(51%),hard to urinate)  . Urinary urgency     Past Surgical History:  Procedure Laterality Date  . CARPAL TUNNEL RELEASE Right 05/2007  . CESAREAN SECTION  1981/1984   x 2  . COLONOSCOPY    . TONSILLECTOMY AND  ADENOIDECTOMY  age 42  . TOTAL SHOULDER ARTHROPLASTY Right 07/04/2013   DR NORRIS  . TOTAL SHOULDER ARTHROPLASTY Right 07/04/2013   Procedure: RIGHT TOTAL SHOULDER ARTHROPLASTY;  Surgeon: Augustin Schooling, MD;  Location: [place];  Service: Orthopedics;  Laterality: Right;  . TOTAL SHOULDER ARTHROPLASTY Left 03/12/2015   Procedure: LEFT SHOULDER TOTAL SHOULDER ARTHROPLASTY;  Surgeon: Netta Cedars, MD;  Location: [place];  Service: Orthopedics;  Laterality: Left;    Current Outpatient Prescriptions  Medication Sig Dispense Refill  . albuterol (PROVENTIL HFA;VENTOLIN HFA) 108 (90 BASE) MCG/ACT inhaler Inhale 2 puffs into the lungs every 6 (six) hours as needed for shortness of breath.     . ALPRAZolam (XANAX) 0.5 MG tablet Take 1 tablet (0.5 mg total) by mouth every 8 (eight) hours as needed for anxiety. 30 tablet 0  . cholecalciferol (VITAMIN D) 1000 UNITS tablet Take 1,000 Units by mouth daily.    . citalopram (CELEXA) 40 MG tablet Take 1 tablet (40 mg total) by mouth daily. 90 tablet 4  . docusate sodium (COLACE) 100 MG capsule Take 400 mg by mouth at bedtime.     . fluticasone (FLONASE) 50 MCG/ACT nasal spray Place 2 sprays into  both nostrils daily as needed for allergies.    . Ketotifen Fumarate (ALAWAY OP) Place 1 drop into both eyes daily as needed (for allergies).    Marland Kitchen levothyroxine (SYNTHROID, LEVOTHROID) 75 MCG tablet Take 75 mcg by mouth daily.    Marland Kitchen loratadine (CLARITIN) 10 MG tablet Take 10 mg by mouth daily as needed for allergies.     Marland Kitchen temazepam (RESTORIL) 30 MG capsule TAKE ONE CAPSULE BY MOUTH AT BEDTIME AS NEEDED FOR SLEEP 30 capsule 2   No current facility-administered medications for this visit.     Family History  Problem Relation Age of Onset  . Breast cancer Sister 27    stage 4, chemo and radiation  . Esophageal cancer Sister   . Diabetes Brother   . Epilepsy Daughter   . Hypertension Mother   . Osteoporosis Mother   . Diabetes Father   . Hypertension Father   . Lung  cancer Paternal Aunt     ROS:  Pertinent items are noted in HPI.  Otherwise, a comprehensive ROS was negative.  Exam:   BP 112/64 (BP Location: Right Arm, Patient Position: Sitting, Cuff Size: Normal)   Pulse 82   Resp 14   Ht 5' 3.25" (1.607 m)   Wt 158 lb 6.4 oz (71.8 kg)   LMP 04/11/2003 (Approximate)   BMI 27.84 kg/m    Height: 5' 3.25" (160.7 cm)  Ht Readings from Last 3 Encounters:  04/17/16 5' 3.25" (1.607 m)  09/13/15 5\' 3"  (1.6 m)  04/08/15 5\' 3"  (1.6 m)   General appearance: alert, cooperative and appears stated age Head: Normocephalic, without obvious abnormality, atraumatic Neck: no adenopathy, supple, symmetrical, trachea midline and thyroid normal to inspection and palpation Lungs: clear to auscultation bilaterally Breasts: normal appearance, no masses or tenderness Heart: regular rate and rhythm Abdomen: soft, non-tender; bowel sounds normal; no masses,  no organomegaly Extremities: extremities normal, atraumatic, no cyanosis or edema Skin: Skin color, texture, turgor normal. No rashes or lesions Lymph nodes: Cervical, supraclavicular, and axillary nodes normal. No abnormal inguinal nodes palpated Neurologic: Grossly normal   Pelvic: External genitalia:  no lesions              Urethra:  normal appearing urethra with no masses, tenderness or lesions              Bartholins and Skenes: normal                 Vagina: normal appearing vagina with normal color and discharge, no lesions              Cervix: no lesions              Pap taken: Yes.   Bimanual Exam:  Uterus:  normal size, contour, position, consistency, mobility, non-tender              Adnexa: normal adnexa and no mass, fullness, tenderness               Rectovaginal: Confirms               Anus:  normal sphincter tone, no lesions  Chaperone was present for exam.  A:  Well Woman with normal exam Hyperthyroidism Hypertension H/O Sjogren's syndrome H/O depression Family hx of breast cancer  with sister H/O shingles x 2 Fair skinned.  Has not see derm recently.  She states she will schedule  P: Mammogram yearly, 3D discussed Pap today and HR HPV today.  Neg pap with  neg HR HPV 1/14 Celexa 40mg  daily.  #90/4RF Restoril 30mg  prn insomnia.  Pt uses rarely.  #30/1RF No blood work done today Return annually or prn

## 2016-04-18 LAB — IPS PAP TEST WITH HPV

## 2016-05-22 ENCOUNTER — Ambulatory Visit (INDEPENDENT_AMBULATORY_CARE_PROVIDER_SITE_OTHER): Payer: BLUE CROSS/BLUE SHIELD | Admitting: Family Medicine

## 2016-05-22 ENCOUNTER — Ambulatory Visit: Payer: BLUE CROSS/BLUE SHIELD | Admitting: Internal Medicine

## 2016-05-22 ENCOUNTER — Encounter: Payer: Self-pay | Admitting: Family Medicine

## 2016-05-22 VITALS — BP 118/80 | HR 78 | Temp 98.1°F | Ht 63.0 in | Wt 158.3 lb

## 2016-05-22 DIAGNOSIS — J3089 Other allergic rhinitis: Secondary | ICD-10-CM

## 2016-05-22 DIAGNOSIS — M19012 Primary osteoarthritis, left shoulder: Secondary | ICD-10-CM

## 2016-05-22 DIAGNOSIS — F3341 Major depressive disorder, recurrent, in partial remission: Secondary | ICD-10-CM

## 2016-05-22 DIAGNOSIS — E039 Hypothyroidism, unspecified: Secondary | ICD-10-CM | POA: Diagnosis not present

## 2016-05-22 DIAGNOSIS — R5383 Other fatigue: Secondary | ICD-10-CM | POA: Diagnosis not present

## 2016-05-22 DIAGNOSIS — M19011 Primary osteoarthritis, right shoulder: Secondary | ICD-10-CM

## 2016-05-22 NOTE — Progress Notes (Signed)
Pre visit review using our clinic review tool, if applicable. No additional management support is needed unless otherwise documented below in the visit note. 

## 2016-05-22 NOTE — Patient Instructions (Signed)
BEFORE YOU LEAVE: -follow up: for physical in 3 months -labs  Get outside for 15-30 minutes most days if possible.  We have ordered labs or studies at this visit. It can take up to 1-2 weeks for results and processing. IF results require follow up or explanation, we will call you with instructions. Clinically stable results will be released to your [facility]. If you have not heard from [place] or cannot find your results in [facility] in 2 weeks please contact our office at [phone].   If you are not yet signed up for [facility], please SIGN UP TODAY. We now offer online scheduling, same day appointments and extended hours. WHEN YOU DON'T FEEL YOUR BEST.Marland KitchenMarland KitchenWE ARE HERE TO HELP.   We recommend the following healthy lifestyle for LIFE: 1) Small portions.   Tip: eat off of a salad plate instead of a dinner plate.  Tip: if you need more or a snack choose fruits, veggies and/or a handful of nuts or seeds.  2) Eat a healthy clean diet.  * Tip: Avoid (less then 1 serving per week): processed foods, sweets, sweetened drinks, white starches (rice, flour, bread, potatoes, pasta, etc), red meat, fast foods, butter  *Tip: CHOOSE instead   * 5-9 servings per day of fresh or frozen fruits and vegetables (but not corn, potatoes, bananas, canned or dried fruit)   *nuts and seeds, beans   *olives and olive oil   *small portions of lean meats such as fish and white chicken    *small portions of whole grains  3)Get at least 150 minutes of sweaty aerobic exercise per week.  4)Reduce stress - consider counseling, meditation and relaxation to balance other aspects of your life.

## 2016-05-22 NOTE — Progress Notes (Signed)
HPI:  Christine Valdez is here to establish care. Used to see a doctor at [facility] - that doctor left and then saw Dr. Rodena Piety and he left also.  Last PCP and physical: she would like to schedule a physical as has been over one year since had labs. Sees gyn (Dr. Sabra Heck) for women's health.  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 Allergy    uses Flonase daily as needed and takes Loratadine daily   . Anemia    only after birth of 2nd child  . Arthritis   . Asthma    uses Albuterol daily as needed;exercise induced  . 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 Depression    takes Celexa daily  . GERD (gastroesophageal reflux disease)    occ  . History of colon polyps   . History of shingles    5-6 yrs  . Hyperlipidemia   . Hypothyroid   . Insomnia    takes Restoril nightly as needed  . Interstitial cystitis   . Osteoarthritis   . Sjogren's syndrome ([place])   . Spinal headache    after 1st C/S, o2sat low after shoulder surgery after went to room(51%),hard to urinate)    Past Surgical History:  Procedure Laterality Date  . CARPAL TUNNEL RELEASE Right 05/2007  . CESAREAN SECTION  1981/1984   x 2  . COLONOSCOPY    . TONSILLECTOMY AND ADENOIDECTOMY  age 15  . TOTAL SHOULDER  ARTHROPLASTY Right 07/04/2013   DR NORRIS  . TOTAL SHOULDER ARTHROPLASTY Right 07/04/2013   Procedure: RIGHT TOTAL SHOULDER ARTHROPLASTY;  Surgeon: Augustin Schooling, MD;  Location: [place];  Service: Orthopedics;  Laterality: Right;  . TOTAL SHOULDER ARTHROPLASTY Left 03/12/2015   Procedure: LEFT SHOULDER TOTAL SHOULDER ARTHROPLASTY;  Surgeon: Netta Cedars, MD;  Location: [place];  Service: Orthopedics;  Laterality: Left;    Family History  Problem Relation Age of Onset  . Breast cancer Sister 71    stage 4, chemo and radiation  . Esophageal cancer Sister   . Diabetes Brother   . Epilepsy Daughter   . Hypertension Mother   . Osteoporosis Mother   . Diabetes Father   . Hypertension Father   . Lung cancer Paternal Aunt     Social History   Social History  . Marital status: Married    Spouse name: N/A  . Number of children: 2  . Years of education: N/A   Social History Main Topics  . Smoking status: Never Smoker  . Smokeless tobacco: Never Used  . Alcohol use 0.0 oz/week     Comment: occ wine  . Drug use: No  . Sexual activity: Not Currently    Partners: Male    Birth control/ protection: Post-menopausal   Other Topics Concern  . 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  albuterol (PROVENTIL HFA;VENTOLIN HFA) 108 (90 BASE) MCG/ACT inhaler, Inhale 2 puffs into the lungs every 6 (six) hours as needed for shortness of breath. , Disp: , Rfl:  .  ALPRAZolam (XANAX) 0.5 MG tablet, Take 1 tablet (0.5 mg total) by mouth every 8 (eight) hours as needed for anxiety., Disp: 30 tablet, Rfl: 0 .  ASPERCREME LIDOCAINE EX, Apply topically. For elbow pain, Disp: , Rfl:  .  cholecalciferol (VITAMIN D) 1000 UNITS tablet, Take 1,000 Units by mouth daily., Disp: , Rfl:  .  citalopram  (CELEXA) 40 MG tablet, Take 1 tablet (40 mg total) by mouth daily., Disp: 90 tablet, Rfl: 4 .  docusate sodium (COLACE) 100 MG capsule, Take 400 mg by mouth at bedtime. , Disp: , Rfl:  .  fluticasone (FLONASE) 50 MCG/ACT nasal spray, Place 2 sprays into both nostrils daily as needed for allergies., Disp: , Rfl:  .  Ketotifen Fumarate (ALAWAY OP), Place 1 drop into both eyes daily as needed (for allergies)., Disp: , Rfl:  .  levothyroxine (SYNTHROID, LEVOTHROID) 75 MCG tablet, Take 75 mcg by mouth daily., Disp: , Rfl:  .  loratadine (CLARITIN) 10 MG tablet, Take 10 mg by mouth daily as needed for allergies. , Disp: , Rfl:  .  temazepam (RESTORIL) 30 MG capsule, Take 1 capsule (30 mg total) by mouth at bedtime as needed. for sleep, Disp: 30 capsule, Rfl: 1  EXAM:  Vitals:   05/22/16 1503  BP: 118/80  Pulse: 78  Temp: 98.1 F (36.7 C)    Body mass index is 28.04 kg/m.  GENERAL: vitals reviewed and listed above, alert, oriented, appears well hydrated and in no acute distress  HEENT: atraumatic, conjunttiva clear, no obvious abnormalities on inspection of external nose and ears  NECK: no obvious masses on inspection  LUNGS: clear to auscultation bilaterally, no wheezes, rales or rhonchi, good air movement  CV: HRRR, no peripheral edema  MS: moves all extremities without noticeable abnormality  PSYCH: pleasant and cooperative, no obvious depression or anxiety  ASSESSMENT AND PLAN:  Discussed the following assessment and plan:  Recurrent major depressive disorder, in partial remission ([place])  Acquired hypothyroidism - Plan: Cholesterol, total, HDL cholesterol, TSH  Osteoarthritis of both shoulders, unspecified osteoarthritis type  Seasonal allergic rhinitis due to other allergic trigger, unspecified chronicity  Fatigue, unspecified type - Plan: CBC, Basic metabolic panel, Vitamin 123456 -We reviewed the PMH, PSH, FH, SH, Meds and Allergies. -for the fatigue - likely depression,  but will check labs today as is past due for follow up/CPE with prior PCP -for the depression advised CBT and regular exercise outside as she does feel this occurs mainly in the winter - Recommended Dr. Glennon Hamilton -CPE and follow up in 3 months, sooner if needed   Patient Instructions  BEFORE YOU LEAVE: -follow up: for physical in 3 months -labs  Get outside for 15-30 minutes most days if possible.  We have ordered labs or studies at this visit. It can take up to 1-2 weeks for results and processing. IF results require follow up or explanation, we will call you with instructions. Clinically stable results will be released to your [facility]. If you  have not heard from [place] or cannot find your results in [facility] in 2 weeks please contact our office at [phone].   If you are not yet signed up for [facility], please SIGN UP TODAY. We now offer online scheduling, same day appointments and extended hours. WHEN YOU DON'T FEEL YOUR BEST.Marland KitchenMarland KitchenWE ARE HERE TO HELP.   We recommend the following healthy lifestyle for LIFE: 1) Small portions.   Tip: eat off of a salad plate instead of a dinner plate.  Tip: if you need more or a snack choose fruits, veggies and/or a handful of nuts or seeds.  2) Eat a healthy clean diet.  * Tip: Avoid (less then 1 serving per week): processed foods, sweets, sweetened drinks, white starches (rice, flour, bread, potatoes, pasta, etc), red meat, fast foods, butter  *Tip: CHOOSE instead   * 5-9 servings per day of fresh or frozen fruits and vegetables (but not corn, potatoes, bananas, canned or dried fruit)   *nuts and seeds, beans   *olives and olive oil   *small portions of lean meats such as fish and white chicken    *small portions of whole grains  3)Get at least 150 minutes of sweaty aerobic exercise per week.  4)Reduce stress - consider counseling, meditation and relaxation to balance other aspects of your life.             Colin Benton R.

## 2016-05-23 LAB — CBC
HEMATOCRIT: 40.6 % (ref 36.0–46.0)
HEMOGLOBIN: 13.9 g/dL (ref 12.0–15.0)
MCHC: 34.3 g/dL (ref 30.0–36.0)
MCV: 88.8 fl (ref 78.0–100.0)
PLATELETS: 179 10*3/uL (ref 150.0–400.0)
RBC: 4.57 Mil/uL (ref 3.87–5.11)
RDW: 12.6 % (ref 11.5–15.5)
WBC: 5.8 10*3/uL (ref 4.0–10.5)

## 2016-05-23 LAB — HDL CHOLESTEROL: HDL: 71 mg/dL (ref >39.00)

## 2016-05-23 LAB — BASIC METABOLIC PANEL
BUN: 14 mg/dL (ref 6–23)
CO2: 30 meq/L (ref 19–32)
Calcium: 10.3 mg/dL (ref 8.4–10.5)
Chloride: 103 mEq/L (ref 96–112)
Creatinine, Ser: 0.84 mg/dL (ref 0.40–1.20)
GFR: 72.65 mL/min (ref >60.00)
GLUCOSE: 68 mg/dL — AB (ref 70–99)
POTASSIUM: 4.4 meq/L (ref 3.5–5.1)
SODIUM: 139 meq/L (ref 135–145)

## 2016-05-23 LAB — CHOLESTEROL, TOTAL: Cholesterol: 229 mg/dL — ABNORMAL HIGH (ref 0–200)

## 2016-05-23 LAB — VITAMIN B12: Vitamin B-12: 270 pg/mL (ref 211–911)

## 2016-05-23 LAB — TSH: TSH: 1.68 u[IU]/mL (ref 0.35–4.50)

## 2016-06-09 ENCOUNTER — Ambulatory Visit (INDEPENDENT_AMBULATORY_CARE_PROVIDER_SITE_OTHER): Payer: BLUE CROSS/BLUE SHIELD | Admitting: Family Medicine

## 2016-06-09 ENCOUNTER — Encounter: Payer: Self-pay | Admitting: Family Medicine

## 2016-06-09 VITALS — BP 120/70 | HR 94 | Temp 98.3°F | Ht 63.0 in | Wt 161.5 lb

## 2016-06-09 DIAGNOSIS — B9789 Other viral agents as the cause of diseases classified elsewhere: Secondary | ICD-10-CM

## 2016-06-09 DIAGNOSIS — J069 Acute upper respiratory infection, unspecified: Secondary | ICD-10-CM | POA: Diagnosis not present

## 2016-06-09 DIAGNOSIS — H66001 Acute suppurative otitis media without spontaneous rupture of ear drum, right ear: Secondary | ICD-10-CM

## 2016-06-09 LAB — POC INFLUENZA A&B (BINAX/QUICKVUE)
INFLUENZA B, POC: NEGATIVE
Influenza A, POC: NEGATIVE

## 2016-06-09 MED ORDER — AMOXICILLIN 500 MG PO CAPS: 500.0000 mg | ORAL_CAPSULE | Freq: Three times a day (TID) | ORAL | 0 refills | Status: DC

## 2016-06-09 NOTE — Progress Notes (Signed)
HPI:  Acute visit for sore throat and congestion: -started: 5 days ago -symptoms:nasal congestion, sore throat, cough, HA, body aches, low grade fevers, R ear pain the last few days -denies:SOB, NVD, tooth pain, sinus pain, neck pain -has tried: nothing -sick contacts/travel/risks: no reported flu, strep or tick exposure -Hx of: allergies  ROS: See pertinent positives and negatives per HPI.  Past Medical History:  Diagnosis Date  . Allergy    uses Flonase daily as needed and takes Loratadine daily   . Anemia    only after birth of 2nd child  . Arthritis   . Asthma    uses Albuterol daily as needed;exercise induced  . 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 Depression    takes Celexa daily  . GERD (gastroesophageal reflux disease)    occ  . History of colon polyps   . History of shingles    5-6 yrs  . Hyperlipidemia   . Hypothyroid   . Insomnia    takes Restoril nightly as needed  . Interstitial cystitis   . Osteoarthritis   . Sjogren's syndrome ([place])   . Spinal headache    after 1st C/S, o2sat low after shoulder surgery after went to room(51%),hard to urinate)    Past Surgical History:  Procedure Laterality Date  . CARPAL TUNNEL RELEASE Right 05/2007  . CESAREAN SECTION  1981/1984   x 2  . COLONOSCOPY    . TONSILLECTOMY AND ADENOIDECTOMY  age 71  . TOTAL SHOULDER ARTHROPLASTY Right 07/04/2013   DR NORRIS  . TOTAL SHOULDER ARTHROPLASTY Right 07/04/2013   Procedure: RIGHT TOTAL SHOULDER ARTHROPLASTY;  Surgeon: Augustin Schooling, MD;  Location: [place];  Service: Orthopedics;  Laterality: Right;  . TOTAL SHOULDER ARTHROPLASTY Left 03/12/2015   Procedure: LEFT SHOULDER TOTAL SHOULDER ARTHROPLASTY;  Surgeon: Netta Cedars, MD;  Location: [place];  Service: Orthopedics;  Laterality: Left;    Family History  Problem Relation Age of Onset  . Breast cancer Sister 44    stage 4, chemo and radiation  . Esophageal cancer Sister   . Diabetes Brother   . Epilepsy  Daughter   . Hypertension Mother   . Osteoporosis Mother   . Diabetes Father   . Hypertension Father   . Lung cancer Paternal Aunt     Social History   Social History  . Marital status: Married    Spouse name: N/A  . Number of children: 2  . Years of education: N/A   Social History Main Topics  . Smoking status: Never Smoker  . Smokeless tobacco: Never Used  . Alcohol use 0.0 oz/week     Comment: occ wine  . Drug use: No  . Sexual activity: Not Currently    Partners: Male    Birth control/ protection: Post-menopausal   Other Topics Concern  . 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  albuterol (PROVENTIL HFA;VENTOLIN HFA) 108 (90 BASE) MCG/ACT inhaler, Inhale 2 puffs into the lungs every 6 (six) hours as needed for shortness of breath. , Disp: , Rfl:  .  ALPRAZolam (XANAX) 0.5 MG tablet, Take 1 tablet (0.5 mg total) by mouth every 8 (eight) hours as needed for  anxiety., Disp: 30 tablet, Rfl: 0 .  ASPERCREME LIDOCAINE EX, Apply topically. For elbow pain, Disp: , Rfl:  .  cholecalciferol (VITAMIN D) 1000 UNITS tablet, Take 1,000 Units by mouth daily., Disp: , Rfl:  .  citalopram (CELEXA) 40 MG tablet, Take 1 tablet (40 mg total) by mouth daily., Disp: 90 tablet, Rfl: 4 .  docusate sodium (COLACE) 100 MG capsule, Take 400 mg by mouth at bedtime. , Disp: , Rfl:  .  fluticasone (FLONASE) 50 MCG/ACT nasal spray, Place 2 sprays into both nostrils daily as needed for allergies., Disp: , Rfl:  .  Ketotifen Fumarate (ALAWAY OP), Place 1 drop into both eyes daily as needed (for allergies)., Disp: , Rfl:  .  levothyroxine (SYNTHROID, LEVOTHROID) 75 MCG tablet, Take 75 mcg by mouth daily., Disp: , Rfl:  .  loratadine (CLARITIN) 10 MG tablet, Take 10 mg by mouth daily as needed for  allergies. , Disp: , Rfl:  .  temazepam (RESTORIL) 30 MG capsule, Take 1 capsule (30 mg total) by mouth at bedtime as needed. for sleep, Disp: 30 capsule, Rfl: 1 .  amoxicillin (AMOXIL) 500 MG capsule, Take 1 capsule (500 mg total) by mouth 3 (three) times daily., Disp: 30 capsule, Rfl: 0  EXAM:  Vitals:   06/09/16 1122  BP: 120/70  Pulse: 94  Temp: 98.3 F (36.8 C)    Body mass index is 28.61 kg/m.  GENERAL: vitals reviewed and listed above, alert, oriented, appears well hydrated and in no acute distress  HEENT: atraumatic, conjunttiva clear, no obvious abnormalities on inspection of external nose and ears, normal appearance of ear canals and TMs except for purulent effusion R ear with bulging, clear nasal congestion, mild post oropharyngeal erythema with PND, no tonsillar edema or exudate, no sinus TTP  NECK: no obvious masses on inspection  LUNGS: clear to auscultation bilaterally, no wheezes, rales or rhonchi, good air movement  CV: HRRR, no peripheral edema  MS: moves all extremities without noticeable abnormality  PSYCH: pleasant and cooperative, no obvious depression or anxiety  ASSESSMENT AND PLAN:  Discussed the following assessment and plan:  Viral upper respiratory illness - Plan: POC Influenza A&B(BINAX/QUICKVUE)  Acute suppurative otitis media of right ear without spontaneous rupture of tympanic membrane, recurrence not specified  -given HPI and exam findings today, a serious infection or illness is unlikely. We discussed potential etiologies, with VURI or mild influenza and 2ncondary AOM being most likely. Will treat with symptomatic care and amoxicillin. We discussed treatment side effects, likely course, transmission, flu testing and treatment, return precuations and signs of developing a serious illness. Rapid flu test neg.  -of course, we advised to return or notify a doctor immediately if symptoms worsen or persist or new concerns arise.    Patient  Instructions  BEFORE YOU LEAVE: -flu test  Take the antibiotic (amoxicillin) as instructed  Albuterol if needed  I hope you are feeling better soon! Seek care immediately if worsening, new concerns or you are not improving with treatment.      Colin Benton R., DO

## 2016-06-09 NOTE — Patient Instructions (Signed)
BEFORE YOU LEAVE: -flu test  Take the antibiotic (amoxicillin) as instructed  Albuterol if needed  I hope you are feeling better soon! Seek care immediately if worsening, new concerns or you are not improving with treatment.

## 2016-06-09 NOTE — Progress Notes (Signed)
Pre visit review using our clinic review tool, if applicable. No additional management support is needed unless otherwise documented below in the visit note. 

## 2016-06-12 ENCOUNTER — Telehealth: Payer: Self-pay | Admitting: Family Medicine

## 2016-06-12 NOTE — Telephone Encounter (Signed)
° ° ° ° ° ° °  Pt call to say she will try over the counter and will call back for the below RX .  SKLICE IS A RX THAT SHE WOULD LIKE TO HAVE. Because it works immediately

## 2016-06-12 NOTE — Telephone Encounter (Signed)
Please let her know lice treatment is available over the counter now (Rid, Nix, Equate...) to use if she has lice. Thanks!

## 2016-06-12 NOTE — Telephone Encounter (Signed)
I called the pt and informed her of the message below

## 2016-06-12 NOTE — Telephone Encounter (Signed)
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 Pt would like rx send to [facility] [address]

## 2016-06-13 ENCOUNTER — Ambulatory Visit: Payer: Self-pay | Admitting: Obstetrics & Gynecology

## 2016-07-11 ENCOUNTER — Telehealth: Payer: Self-pay | Admitting: Obstetrics & Gynecology

## 2016-07-11 NOTE — Telephone Encounter (Signed)
Patient said Dr.Miller had recommended a family therapist for her and patient cannot recall the name of this therapist. Patient said Dr.Miller told her to call her insurance company first to verify she has coverage for this therapist before making an appointment. Patient cannot remember what Dr.Miller instructed her to do when calling her insurance company.

## 2016-07-11 NOTE — Telephone Encounter (Signed)
Marya Amsler at [facility] is the name I gave her.

## 2016-07-11 NOTE — Telephone Encounter (Signed)
Patient was last seen in the office on 04/17/2016 for aex. Per review of OV note I see that recommendations were made to PCP's in the area. Do not see family therapist listed. Routing to [place] for review and advise.

## 2016-07-12 NOTE — Telephone Encounter (Signed)
Call to patient. Message given as seen below from Dr. Sabra Heck. Information provided to patient for Rodena Goldmann, [facility] with [facility]. Phone number [phone] and website [email or web address] given to patient. Patient agreeable and thankful for phone call and information.   Routing to provider for final review. Patient agreeable to disposition. Will close encounter.

## 2016-07-12 NOTE — Telephone Encounter (Signed)
Spoke with patient. Advised of message as seen below from [place]. Patient states that she knows Marya Amsler personally from work and does not feel comfortable with seeing her as a patient. Patient is requesting an alternative provider if Dr.Miller has further recommendations. Advised I will speak with Dr.Miller and return call with further recommendations. Patient is agreeable.

## 2016-07-12 NOTE — Telephone Encounter (Signed)
Rodena Goldmann was likely the name I gave her.  She is on [facility].  There are some cards in the drawer at my work desk in the hall for contact information.

## 2016-07-12 NOTE — Telephone Encounter (Signed)
Left message to call Christine Valdez at [phone]. 

## 2016-07-18 ENCOUNTER — Telehealth: Payer: Self-pay | Admitting: Obstetrics & Gynecology

## 2016-07-18 NOTE — Telephone Encounter (Signed)
Patient is having trouble getting set up with the counselor Dr. Sabra Heck recommended at [facility]. They are not returning her calls she said. The patient also said, "Is there any way Dr. Sabra Heck can call to get me in quicker? I really need to see someone as soon as possible. I'm not suicidal but I'm having a really hard time right now."

## 2016-07-18 NOTE — Telephone Encounter (Signed)
Spoke with patient, advised message left to return call to [place]. Patient asking if any other locations that may have appointments available? Provided patient with contact to [place], [phone]. Patient states will return call if scheduled. Patient thankful for return call.

## 2016-07-18 NOTE — Telephone Encounter (Signed)
Spoke with patient. Patient states she has attempted to schedule an OV for evaluation with Rodena Goldmann at [facility] and has been unsuccessful. Patient states she has a lot of emotions going on right now d/t unexpected separation that daughter is going through with spouse. Patient states she has talked with pastor and son, but really needs to talk with someone that she does not know. Patient denies suicidal ideations. Advised patient would place call to [facility] to assist with scheduling and return call with updates. Patient verbalizes understanding and is agreeable.   Call placed to [facility] - Left message to call Sharee Pimple at [phone].

## 2016-07-18 NOTE — Telephone Encounter (Signed)
Left message to call Sharee Pimple at [phone].  Rodena Goldmann, [place] at [place]

## 2016-07-20 NOTE — Telephone Encounter (Signed)
Spoke with patient. Patient states she did receive a return call back from [place] yesterday and is scheduled with Rodena Goldmann tomorrow at 2pm. Patient thankful for help and return call.   Routing to provider for final review. Patient is agreeable to disposition. Will close encounter.

## 2016-07-20 NOTE — Telephone Encounter (Signed)
Left message to call Carlisia Geno at [phone].  

## 2016-08-09 ENCOUNTER — Encounter: Payer: Self-pay | Admitting: Family Medicine

## 2016-08-09 ENCOUNTER — Ambulatory Visit (INDEPENDENT_AMBULATORY_CARE_PROVIDER_SITE_OTHER): Payer: BLUE CROSS/BLUE SHIELD | Admitting: Family Medicine

## 2016-08-09 VITALS — BP 140/88 | HR 84 | Temp 98.2°F | Wt 158.2 lb

## 2016-08-09 DIAGNOSIS — J0100 Acute maxillary sinusitis, unspecified: Secondary | ICD-10-CM

## 2016-08-09 DIAGNOSIS — H66001 Acute suppurative otitis media without spontaneous rupture of ear drum, right ear: Secondary | ICD-10-CM | POA: Diagnosis not present

## 2016-08-09 DIAGNOSIS — R05 Cough: Secondary | ICD-10-CM | POA: Diagnosis not present

## 2016-08-09 DIAGNOSIS — R059 Cough, unspecified: Secondary | ICD-10-CM

## 2016-08-09 MED ORDER — HYDROCODONE-HOMATROPINE 5-1.5 MG/5ML PO SYRP: 5.0000 mL | ORAL_SOLUTION | Freq: Three times a day (TID) | ORAL | 0 refills | Status: DC | PRN

## 2016-08-09 MED ORDER — BENZONATATE 100 MG PO CAPS
100.0000 mg | ORAL_CAPSULE | Freq: Three times a day (TID) | ORAL | 0 refills | Status: DC
Start: 2016-08-09 — End: 2016-08-21

## 2016-08-09 MED ORDER — AMOXICILLIN-POT CLAVULANATE 875-125 MG PO TABS: 1.0000 | ORAL_TABLET | Freq: Two times a day (BID) | ORAL | 0 refills | Status: DC

## 2016-08-09 NOTE — Progress Notes (Signed)
Pre visit review using our clinic review tool, if applicable. No additional management support is needed unless otherwise documented below in the visit note. 

## 2016-08-09 NOTE — Progress Notes (Signed)
Patient ID: Christine Valdez, female   DOB: 21-May-1950, 66 y.o.   MRN: [id number]  PCP: Lucretia Kern., DO  Subjective:  Christine Valdez is a 66 y.o. year old very pleasant female patient who presents with symptoms including nasal congestion, post nasal drip, rhinitis with green/sticky mucous, hyponasal speech, sore throat, cough that is nonproductive, and loss of smell/taste, change in appetite, and mild tooth pain -started: symptoms have been present over 7 to 10 days, symptoms are worsening. She was seen in this office 06/09/2016 where she was evaluated and treated for AOM and a viral illness. She reports taking Amoxicillin; symptoms improved; however  symptoms noted above are now present and worsening. -previous treatments: Saline nasal rinses, decongestants, flonase, claritin, and increased vitamin C. -sick contacts/travel/risks: denies flu exposure.  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  She has recent exposure to an 64 month old who has been sick with fever, chills, and "cold symptoms" Antibiotic use: Amoxicillin for AOM 2 months ago.  ROS-denies fever, chills, SOB, NVD,   Pertinent Past Medical History-  has a past medical history of Allergy; Anemia; Arthritis; Asthma; Cancer ([place]) (2007); Depression; GERD (gastroesophageal reflux disease); History of colon polyps; History of shingles; Hyperlipidemia; Hypothyroid; Insomnia; Interstitial cystitis; Osteoarthritis; Sjogren's syndrome ([place]); and Spinal headache.  Medications- reviewed  Current Outpatient Prescriptions  Medication Sig Dispense Refill  . albuterol (PROVENTIL HFA;VENTOLIN HFA) 108 (90 BASE) MCG/ACT inhaler Inhale 2 puffs into the lungs every 6 (six) hours as needed for shortness of breath.     . ASPERCREME LIDOCAINE EX Apply topically. For elbow pain    . cholecalciferol (VITAMIN D) 1000 UNITS tablet Take 1,000 Units by mouth daily.    . citalopram (CELEXA) 40 MG tablet Take 1 tablet (40 mg  total) by mouth daily. 90 tablet 4  . docusate sodium (COLACE) 100 MG capsule Take 400 mg by mouth at bedtime.     . fluticasone (FLONASE) 50 MCG/ACT nasal spray Place 2 sprays into both nostrils daily as needed for allergies.    . Ketotifen Fumarate (ALAWAY OP) Place 1 drop into both eyes daily as needed (for allergies).    Marland Kitchen levothyroxine (SYNTHROID, LEVOTHROID) 75 MCG tablet Take 75 mcg by mouth daily.    Marland Kitchen loratadine (CLARITIN) 10 MG tablet Take 10 mg by mouth daily as needed for allergies.     Marland Kitchen temazepam (RESTORIL) 30 MG capsule Take 1 capsule (30 mg total) by mouth at bedtime as needed. for sleep 30 capsule 1  . ALPRAZolam (XANAX) 0.5 MG tablet Take 1 tablet (0.5 mg total) by mouth every 8 (eight) hours as needed for anxiety. (Patient not taking: Reported on 08/09/2016) 30 tablet 0   No current facility-administered medications for this visit.     Objective: BP 140/88 (BP Location: Left Arm, Patient Position: Sitting, Cuff Size: Normal)   Pulse 84   Temp 98.2 F (36.8 C) (Oral)   Wt 158 lb 3.2 oz (71.8 kg)   LMP 04/11/2003 (Approximate)   SpO2 97%   BMI 28.02 kg/m  Gen: NAD, resting comfortably HEENT:  Hyponasal speech, Turbinates erythematous, Left TM normal, Right TM bulging and erythematous, pharynx mildly erythematous with no tonsilar exudate or edema, positive maxillary sinus tenderness CV: RRR no murmurs rubs or gallops Lungs: CTAB no crackles, wheeze, rhonchi Abdomen: soft/nontender/nondistended/normal bowel sounds. No rebound or guarding.  Ext: no edema Skin: warm, dry, no rash Neuro: grossly normal, moves all extremities  Assessment/Plan: 1. 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 - amoxicillin-clavulanate (AUGMENTIN) 875-125 MG tablet; Take 1 tablet by mouth 2 (two) times daily.  Dispense: 20 tablet; Refill: 0  2. Cough  -  benzonatate (TESSALON) 100 MG capsule; Take 1 capsule (100 mg total) by mouth 3 (three) times daily.  Dispense: 20 capsule; Refill: 0 - HYDROcodone-homatropine (HYCODAN) 5-1.5 MG/5ML syrup; Take 5 mLs by mouth every 8 (eight) hours as needed for cough.  Dispense: 120 mL; Refill: 0  3. Acute suppurative otitis media of right ear without spontaneous rupture of tympanic membrane, recurrence not specified Augmentin provided for AOM and sinusitis. Advised follow up if symptoms do not improve with treatment in 3 to 4 days, worsen, or she develops a fever >101.    Delano Metz, FNP-C

## 2016-08-09 NOTE — Patient Instructions (Addendum)
Please take medication as directed and follow up with if symptoms do not improve with treatment in 3 to 4 days, worsen, or you develop a fever >101.   Sinusitis, Adult Sinusitis is soreness and inflammation of your sinuses. Sinuses are hollow spaces in the bones around your face. They are located:  Around your eyes.  In the middle of your forehead.  Behind your nose.  In your cheekbones. Your sinuses and nasal passages are lined with a stringy fluid (mucus). Mucus normally drains out of your sinuses. When your nasal tissues get inflamed or swollen, the mucus can get trapped or blocked so air cannot flow through your sinuses. This lets bacteria, viruses, and funguses grow, and that leads to infection. Follow these instructions at home: Medicines   Take, use, or apply over-the-counter and prescription medicines only as told by your doctor. These may include nasal sprays.  If you were prescribed an antibiotic medicine, take it as told by your doctor. Do not stop taking the antibiotic even if you start to feel better. Hydrate and Humidify   Drink enough water to keep your pee (urine) clear or pale yellow.  Use a cool mist humidifier to keep the humidity level in your home above 50%.  Breathe in steam for 10-15 minutes, 3-4 times a day or as told by your doctor. You can do this in the bathroom while a hot shower is running.  Try not to spend time in cool or dry air. Rest   Rest as much as possible.  Sleep with your head raised (elevated).  Make sure to get enough sleep each night. General instructions   Put a warm, moist washcloth on your face 3-4 times a day or as told by your doctor. This will help with discomfort.  Wash your hands often with soap and water. If there is no soap and water, use hand sanitizer.  Do not smoke. Avoid being around people who are smoking (secondhand smoke).  Keep all follow-up visits as told by your doctor. This is important. Contact a doctor  if:  You have a fever.  Your symptoms get worse.  Your symptoms do not get better within 10 days. Get help right away if:  You have a very bad headache.  You cannot stop throwing up (vomiting).  You have pain or swelling around your face or eyes.  You have trouble seeing.  You feel confused.  Your neck is stiff.  You have trouble breathing. This information is not intended to replace advice given to you by your health care provider. Make sure you discuss any questions you have with your health care provider. Document Released: 09/13/2007 Document Revised: 11/21/2015 Document Reviewed: 01/20/2015 Elsevier Interactive Patient Education  2017 [employer] NOW OFFER   [facility] FAST TRACK!!!  SAME DAY Appointments for ACUTE CARE  Such as: Sprains, Injuries, cuts, abrasions, rashes, muscle pain, joint pain, back pain Colds, flu, sore throats, headache, allergies, cough, fever  Ear pain, sinus and eye infections Abdominal pain, nausea, vomiting, diarrhea, upset stomach Animal/insect bites  3 Easy Ways to Schedule: Walk-In Scheduling Call in scheduling Mychart Sign-up: [email or web address]

## 2016-08-21 ENCOUNTER — Encounter: Payer: Self-pay | Admitting: Family Medicine

## 2016-08-21 ENCOUNTER — Ambulatory Visit (INDEPENDENT_AMBULATORY_CARE_PROVIDER_SITE_OTHER): Payer: BLUE CROSS/BLUE SHIELD | Admitting: Family Medicine

## 2016-08-21 VITALS — BP 130/90 | HR 93 | Temp 99.6°F | Wt 158.7 lb

## 2016-08-21 DIAGNOSIS — J019 Acute sinusitis, unspecified: Secondary | ICD-10-CM

## 2016-08-21 MED ORDER — DOXYCYCLINE HYCLATE 100 MG PO CAPS: 100.0000 mg | ORAL_CAPSULE | Freq: Two times a day (BID) | ORAL | 0 refills | Status: DC

## 2016-08-21 NOTE — Progress Notes (Signed)
Subjective:     Patient ID: Christine Valdez, female   DOB: 1950/12/25, 66 y.o.   MRN: [id number]  HPI Patient seen with recurrent sinusitis type symptoms. She was seen in March with possible viral process but had some ear findings and was started on amoxicillin. She was then seen on May 2 with maxillary sinusitis type symptoms and started on Augmentin. She felt much better but after stopping the antibiotic last week had a relapse of symptoms several days ago. She has cough, maxillary sinus pressure, frequent headache, fatigue. Her nasal discharge which had been colored and yellow has become more clear. She's had some intermittent mild nausea but no vomiting. She has reported allergies to sulfa and quinolones.  Past Medical History:  Diagnosis Date  . Allergy    uses Flonase daily as needed and takes Loratadine daily   . Anemia    only after birth of 2nd child  . Arthritis   . Asthma    uses Albuterol daily as needed;exercise induced  . 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 Depression    takes Celexa daily  . GERD (gastroesophageal reflux disease)    occ  . History of colon polyps   . History of shingles    5-6 yrs  . Hyperlipidemia   . Hypothyroid   . Insomnia    takes Restoril nightly as needed  . Interstitial cystitis   . Osteoarthritis   . Sjogren's syndrome ([place])   . Spinal headache    after 1st C/S, o2sat low after shoulder surgery after went to room(51%),hard to urinate)   Past Surgical History:  Procedure Laterality Date  . CARPAL TUNNEL RELEASE Right 05/2007  . CESAREAN SECTION  1981/1984   x 2  . COLONOSCOPY    . TONSILLECTOMY AND ADENOIDECTOMY  age 21  . TOTAL SHOULDER ARTHROPLASTY Right 07/04/2013   DR NORRIS  . TOTAL SHOULDER ARTHROPLASTY Right 07/04/2013   Procedure: RIGHT TOTAL SHOULDER ARTHROPLASTY;  Surgeon: Augustin Schooling, MD;  Location: [place] [facility];  Service: Orthopedics;  Laterality: Right;  . TOTAL SHOULDER ARTHROPLASTY Left 03/12/2015   Procedure: LEFT SHOULDER TOTAL SHOULDER ARTHROPLASTY;  Surgeon: Netta Cedars, MD;  Location: [place];  Service: Orthopedics;  Laterality: Left;    reports that she has never smoked. She has never used smokeless tobacco. She reports that she drinks alcohol. She reports that she does not use drugs. family history includes Breast cancer (age of onset: 106) in her sister; Diabetes in her brother and father; Epilepsy in her daughter; Esophageal cancer in her sister; Hypertension in her father and mother; Lung cancer in her paternal aunt; Osteoporosis in her mother. Allergies  Allergen Reactions  . Diflucan [Fluconazole] Hives    unknown  . Thimerosal Hives  . Mercury Detox [Nutritional Supplements] Hives  . Influenza Vaccine Recombinant Rash    Mercury component in flu vaccine  . Levaquin [Levofloxacin] Rash  . Sulfa Antibiotics Rash     Review of Systems  Constitutional: Positive for fatigue. Negative for chills and fever.  HENT: Positive for sinus pain and sinus pressure.   Respiratory: Positive for cough.   Neurological: Positive for headaches.       Objective:   Physical Exam  Constitutional: She appears well-developed and well-nourished.  HENT:  Right Ear: External ear normal.  Left Ear: External ear normal.  Mouth/Throat: Oropharynx is clear and moist.  No visible nasal polyps. She has nonspecific mild erythema nasal mucosa. No visible drainage  Neck:  Neck supple.  Cardiovascular: Normal rate and regular rhythm.   Pulmonary/Chest: Effort normal and breath sounds normal. No respiratory distress. She has no wheezes. She has no rales.  Lymphadenopathy:    She has no cervical adenopathy.       Assessment:     Patient presents with recurrence of possible maxillary sinusitis bilaterally. She was improving on Augmentin but then relapsed after stopping antibiotics    Plan:     -We discussed possible limited CT of sinuses but she has several commitments later this week including  graduation of 1 her sons. We agreed start doxycycline 100 mg twice daily for 10 days. -cautioned about photosensitivity. -Continue with probiotic -Consider limited CT of sinuses as above if not fully resolved at the end of this course of antibiotic  Eulas Post MD [facility]

## 2016-08-21 NOTE — Patient Instructions (Signed)
Continue with the probiotics If sinuses no better after this course would consider limited CT of sinuses.

## 2016-08-25 ENCOUNTER — Telehealth: Payer: Self-pay | Admitting: Family Medicine

## 2016-08-25 NOTE — Telephone Encounter (Signed)
Pt has called twice and she is having problems with her medication (antibiotic) and would like to have a call back before the end of today to discuss another alternative.

## 2016-08-25 NOTE — Telephone Encounter (Signed)
Patient started doxycyline on Monday; (third abx), started having severe diarrhea on Wednesday. She stopped taking on Wednesday; patient was taking abx for sinus infection and bronchitis. Patient reports continued cough, clear congestion, but is better than she was at appt on Monday- denies fever or any other symptoms. Advised Dr. Elease Hashimoto of situation; MD orders for patient to d/c doxycycline, try OTC mucinex and increase fluids. Advised patient of instructions and educated on if symptoms worsen or persist to return call to office; patient voiced understanding.

## 2016-08-28 NOTE — Telephone Encounter (Signed)
Noted  

## 2016-08-31 ENCOUNTER — Encounter: Payer: BLUE CROSS/BLUE SHIELD | Admitting: Family Medicine

## 2016-09-27 ENCOUNTER — Other Ambulatory Visit: Payer: Self-pay | Admitting: Obstetrics & Gynecology

## 2016-09-27 NOTE — Telephone Encounter (Signed)
I will send this message to Dr. Sabra Heck for her review and determination of Rx.   Cc- Dr. Sabra Heck

## 2016-09-27 NOTE — Telephone Encounter (Signed)
Medication refill request: Restoril  Last AEX:  04-17-16  Next AEX: 07-19-17  Last MMG (if hormonal medication request): 01-24-16 WNL  Refill authorized: please advise  Sending to BS since SM is out of the office

## 2016-09-27 NOTE — Progress Notes (Signed)
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 MSabra Heckfor paps/pelvics/breast exams  -sexual activity: yes, female partner, no new partners  -wants STI testing (Hep C if born [birth year]: no  -FH breast, colon or ovarian ca: see FH Last mammogram: birads 1 10/17 Last colon cancer screening: 08/2014; 5  Yr repeat advised for hx precancerous polyps per colonoscopy report  Does skin exams with dermatologist   -Alcohol, Tobacco, drug use: see social history  Review of Systems - no fevers, unintentional weight loss, vision loss, hearing loss, chest pain, sob, hemoptysis, melena, hematochezia, hematuria, genital discharge, changing or concerning skin lesions, bleeding, bruising, loc, thoughts of self harm or SI  Past Medical History:  Diagnosis Date  . Allergy    uses Flonase daily as needed and takes Loratadine daily   . Anemia    only after birth of 2nd child  . Arthritis   . Asthma    uses Albuterol daily as needed;exercise induced  . 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, right cheek  . Depression    takes Celexa daily  . GERD (gastroesophageal reflux disease)    occ  . History of colon  polyps   . History of shingles    5-6 yrs  . Hyperlipidemia   . Hypothyroid   . Insomnia    takes Restoril nightly as needed  . Interstitial cystitis   . Osteoarthritis   . Sjogren's syndrome ([place]   . Spinal headache    after 1st C/S, o2sat low after shoulder surgery after went to room(51%),hard to urinate)    Past Surgical History:  Procedure Laterality Date  . CARPAL TUNNEL RELEASE Right 05/2007  . CESAREAN SECTION  1981/1984   x 2  . COLONOSCOPY    . TONSILLECTOMY AND ADENOIDECTOMY  age 66 . TOTAL SHOULDER ARTHROPLASTY Right 07/04/2013   DR NORRIS  . TOTAL SHOULDER ARTHROPLASTY Right 07/04/2013   Procedure: RIGHT TOTAL SHOULDER ARTHROPLASTY;  Surgeon: SAugustin Schooling MD;  [facility]  Service: Orthopedics;  Laterality: Right;  . TOTAL SHOULDER ARTHROPLASTY Left 03/12/2015   Procedure: LEFT SHOULDER TOTAL SHOULDER ARTHROPLASTY;  Surgeon: SNetta Cedars MD;  [facility] [facility]  Service: Orthopedics;  Laterality: Left;    Family History  Problem Relation Age of Onset  . Breast cancer Sister 664      stage 4, chemo and radiation  . Esophageal cancer Sister   . Diabetes Brother   . Epilepsy Daughter   . Hypertension Mother   . Osteoporosis Mother   . Diabetes Father   . Hypertension Father   . Lung cancer Paternal Aunt     Social History   Social History  . Marital status: Married    Spouse name:  N/A  . Number of children: 2  . Years of education: N/A   Social History Main Topics  . Smoking status: Never Smoker  . Smokeless tobacco: Never Used  . Alcohol use 0.0 oz/week     Comment: occ wine  . Drug use: No  . Sexual activity: Not Currently    Partners: Male    Birth control/ protection: Post-menopausal   Other Topics Concern  . 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  albuterol (PROVENTIL HFA;VENTOLIN HFA) 108 (90 BASE) MCG/ACT inhaler, Inhale 2 puffs into the lungs every 6 (six) hours as needed for shortness of breath. , Disp: , Rfl:  .  ASPERCREME LIDOCAINE EX, Apply topically. For elbow pain, Disp: , Rfl:  .  cholecalciferol (VITAMIN D) 1000 UNITS tablet, Take 1,000 Units by mouth daily., Disp: , Rfl:  .  citalopram (CELEXA) 40 MG tablet, Take 1 tablet (40 mg total) by mouth daily., Disp: 90 tablet, Rfl: 3 .  docusate sodium (COLACE) 100 MG capsule, Take 400 mg by mouth at bedtime. , Disp: , Rfl:  .  fluticasone (FLONASE) 50 MCG/ACT nasal spray, Place 2 sprays into both nostrils daily as needed for allergies., Disp: , Rfl:  .  Ketotifen Fumarate (ALAWAY OP), Place 1 drop into both eyes daily as needed (for allergies)., Disp: , Rfl:  .  levothyroxine (SYNTHROID, LEVOTHROID) 75 MCG tablet, Take 1 tablet (75 mcg total) by mouth daily., Disp: 90 tablet, Rfl: 3 .  loratadine (CLARITIN) 10 MG tablet, Take 10 mg by mouth daily as needed for allergies. , Disp: , Rfl:  .  temazepam (RESTORIL) 30 MG capsule, Take 1 capsule (30 mg total) by mouth at bedtime as needed. for sleep, Disp: 30 capsule, Rfl: 1  EXAM:  Vitals:   09/28/16 0911  BP: 122/78  Pulse: 77  Temp: 97.4 F (36.3 C)    GENERAL: vitals reviewed and listed below, alert, oriented, appears well hydrated and in no acute distress  HEENT: head atraumatic, PERRLA, normal appearance of eyes, ears, nose and mouth. moist mucus membranes.  NECK: supple, no masses or lymphadenopathy  LUNGS: clear to auscultation bilaterally, no rales, rhonchi or wheeze  CV: HRRR, no peripheral edema or cyanosis, normal pedal pulses  ABDOMEN: bowel sounds normal, soft, non tender to palpation, no masses, no rebound or guarding  SKIN: declined - sees dermatologist  GU/BREAST: declined, sees gyn  MS: normal gait, moves all extremities normally  NEURO: normal gait, speech and thought  processing grossly intact, muscle tone grossly intact throughout  PSYCH: normal affect, pleasant and cooperative  ASSESSMENT AND PLAN:  Discussed the following assessment and plan:  Encounter for preventive health examination - Plan: Lipid panel -Discussed and advised all [place] preventive services health task force level A and B recommendations for age, sex and risks. -Advised at least 150 minutes of exercise per week and a healthy diet with avoidance of (less then 1 serving per week) processed foods, white starches, red meat, fast foods and sweets and consisting of: * 5-9 servings of fresh fruits and vegetables (not corn or potatoes) *nuts and seeds, beans *olives and olive oil *lean meats such as fish and white chicken  *whole grains -labs,  studies and vaccines per orders this encounter  Recurrent major depressive disorder, in partial remission ([place]) -see phq9 and hpi -refill celexa -discussed tx options/risks - advise caution with benzo, she uses rarely, gyn prescribes -supported/counseled on current situation, >50% 15 minutes in assessment and tx spent in counseling  Hypothyroidism, unspecified type - Plan: TSH -labs, refills  B12 deficiency - Plan: Vitamin B12  Hyperglycemia - Plan: Hemoglobin A1c     Orders Placed This Encounter  Procedures  . Vitamin B12  . Hemoglobin A1c  . Lipid panel  . TSH    Patient advised to return to clinic immediately if symptoms worsen or persist or new concerns.  Patient Instructions  BEFORE YOU LEAVE: -WUJ8 -labs -follow up in 4-6 months  We have ordered labs or studies at this visit. It can take up to 1-2 weeks for results and processing. IF results require follow up or explanation, we will call you with instructions. Clinically stable results will be released to your [facility]. If you have not heard from [place] or cannot find your results in [facility] in 2 weeks please contact our office at [phone].  If you are not yet signed up for  [facility], please consider signing up.  Continue counseling, celexa, a healthy diet and regular exercise. Please schedule follow up soon if any worsening of mood.  Advise regular aerobic exercise (at least 150 minutes per week of sweaty exercise) and a healthy diet. Try to eat at least 5-9 servings of vegetables and fruits per day (not corn, potatoes or bananas.) Avoid sweets, red meat, pork, butter, fried foods, fast food, processed food, excessive dairy, eggs and coconut. Replace bad fats with good fats - fish, nuts and seeds, canola oil, olive oil.    Health Maintenance for Postmenopausal Women Menopause is a normal process in which your reproductive ability comes to an end. This process happens gradually over a span of months to years, usually between the ages of 56 and 44. Menopause is complete when you have missed 12 consecutive menstrual periods. It is important to talk with your health care provider about some of the most common conditions that affect postmenopausal women, such as heart disease, cancer, and bone loss (osteoporosis). Adopting a healthy lifestyle and getting preventive care can help to promote your health and wellness. Those actions can also lower your chances of developing some of these common conditions. What should I know about menopause? During menopause, you may experience a number of symptoms, such as:  Moderate-to-severe hot flashes.  Night sweats.  Decrease in sex drive.  Mood swings.  Headaches.  Tiredness.  Irritability.  Memory problems.  Insomnia.  Choosing to treat or not to treat menopausal changes is an individual decision that you make with your health care provider. What should I know about hormone replacement therapy and supplements? Hormone therapy products are effective for treating symptoms that are associated with menopause, such as hot flashes and night sweats. Hormone replacement carries certain risks, especially as you become older. If  you are thinking about using estrogen or estrogen with progestin treatments, discuss the benefits and risks with your health care provider. What should I know about heart disease and stroke? Heart disease, heart attack, and stroke become more likely as you age. This may be due, in part, to the hormonal changes that your body experiences during menopause. These can affect how your body processes dietary fats, triglycerides, and cholesterol. Heart attack and stroke are both medical emergencies. There are many things that you  can do to help prevent heart disease and stroke:  Have your blood pressure checked at least every 1-2 years. High blood pressure causes heart disease and increases the risk of stroke.  If you are 60-3 years old, ask your health care provider if you should take aspirin to prevent a heart attack or a stroke.  Do not use any tobacco products, including cigarettes, chewing tobacco, or electronic cigarettes. If you need help quitting, ask your health care provider.  It is important to eat a healthy diet and maintain a healthy weight. ? Be sure to include plenty of vegetables, fruits, low-fat dairy products, and lean protein. ? Avoid eating foods that are high in solid fats, added sugars, or salt (sodium).  Get regular exercise. This is one of the most important things that you can do for your health. ? Try to exercise for at least 150 minutes each week. The type of exercise that you do should increase your heart rate and make you sweat. This is known as moderate-intensity exercise. ? Try to do strengthening exercises at least twice each week. Do these in addition to the moderate-intensity exercise.  Know your numbers.Ask your health care provider to check your cholesterol and your blood glucose. Continue to have your blood tested as directed by your health care provider.  What should I know about cancer screening? There are several types of cancer. Take the following steps to  reduce your risk and to catch any cancer development as early as possible. Breast Cancer  Practice breast self-awareness. ? This means understanding how your breasts normally appear and feel. ? It also means doing regular breast self-exams. Let your health care provider know about any changes, no matter how small.  If you are 40 or older, have a clinician do a breast exam (clinical breast exam or CBE) every year. Depending on your age, family history, and medical history, it may be recommended that you also have a yearly breast X-ray (mammogram).  If you have a family history of breast cancer, talk with your health care provider about genetic screening.  If you are at high risk for breast cancer, talk with your health care provider about having an MRI and a mammogram every year.  Breast cancer (BRCA) gene test is recommended for women who have family members with BRCA-related cancers. Results of the assessment will determine the need for genetic counseling and BRCA1 and for BRCA2 testing. BRCA-related cancers include these types: ? Breast. This occurs in males or females. ? Ovarian. ? Tubal. This may also be called fallopian tube cancer. ? Cancer of the abdominal or pelvic lining (peritoneal cancer). ? Prostate. ? Pancreatic.  Cervical, Uterine, and Ovarian Cancer Your health care provider may recommend that you be screened regularly for cancer of the pelvic organs. These include your ovaries, uterus, and vagina. This screening involves a pelvic exam, which includes checking for microscopic changes to the surface of your cervix (Pap test).  For women ages 21-65, health care providers may recommend a pelvic exam and a Pap test every three years. For women ages 23-65, they may recommend the Pap test and pelvic exam, combined with testing for human papilloma virus (HPV), every five years. Some types of HPV increase your risk of cervical cancer. Testing for HPV may also be done on women of any  age who have unclear Pap test results.  Other health care providers may not recommend any screening for nonpregnant women who are considered low risk for pelvic cancer  and have no symptoms. Ask your health care provider if a screening pelvic exam is right for you.  If you have had past treatment for cervical cancer or a condition that could lead to cancer, you need Pap tests and screening for cancer for at least 20 years after your treatment. If Pap tests have been discontinued for you, your risk factors (such as having a new sexual partner) need to be reassessed to determine if you should start having screenings again. Some women have medical problems that increase the chance of getting cervical cancer. In these cases, your health care provider may recommend that you have screening and Pap tests more often.  If you have a family history of uterine cancer or ovarian cancer, talk with your health care provider about genetic screening.  If you have vaginal bleeding after reaching menopause, tell your health care provider.  There are currently no reliable tests available to screen for ovarian cancer.  Lung Cancer Lung cancer screening is recommended for adults 21-61 years old who are at high risk for lung cancer because of a history of smoking. A yearly low-dose CT scan of the lungs is recommended if you:  Currently smoke.  Have a history of at least 30 pack-years of smoking and you currently smoke or have quit within the past 15 years. A pack-year is smoking an average of one pack of cigarettes per day for one year.  Yearly screening should:  Continue until it has been 15 years since you quit.  Stop if you develop a health problem that would prevent you from having lung cancer treatment.  Colorectal Cancer  This type of cancer can be detected and can often be prevented.  Routine colorectal cancer screening usually begins at age 61 and continues through age 66.  If you have risk factors  for colon cancer, your health care provider may recommend that you be screened at an earlier age.  If you have a family history of colorectal cancer, talk with your health care provider about genetic screening.  Your health care provider may also recommend using home test kits to check for hidden blood in your stool.  A small camera at the end of a tube can be used to examine your colon directly (sigmoidoscopy or colonoscopy). This is done to check for the earliest forms of colorectal cancer.  Direct examination of the colon should be repeated every 5-10 years until age 61. However, if early forms of precancerous polyps or small growths are found or if you have a family history or genetic risk for colorectal cancer, you may need to be screened more often.  Skin Cancer  Check your skin from head to toe regularly.  Monitor any moles. Be sure to tell your health care provider: ? About any new moles or changes in moles, especially if there is a change in a mole's shape or color. ? If you have a mole that is larger than the size of a pencil eraser.  If any of your family members has a history of skin cancer, especially at a young age, talk with your health care provider about genetic screening.  Always use sunscreen. Apply sunscreen liberally and repeatedly throughout the day.  Whenever you are outside, protect yourself by wearing long sleeves, pants, a wide-brimmed hat, and sunglasses.  What should I know about osteoporosis? Osteoporosis is a condition in which bone destruction happens more quickly than new bone creation. After menopause, you may be at an increased risk for  osteoporosis. To help prevent osteoporosis or the bone fractures that can happen because of osteoporosis, the following is recommended:  If you are 41-85 years old, get at least 1,000 mg of calcium and at least 600 mg of vitamin D per day.  If you are older than age 46 but younger than age 59, get at least 1,200 mg of  calcium and at least 600 mg of vitamin D per day.  If you are older than age 53, get at least 1,200 mg of calcium and at least 800 mg of vitamin D per day.  Smoking and excessive alcohol intake increase the risk of osteoporosis. Eat foods that are rich in calcium and vitamin D, and do weight-bearing exercises several times each week as directed by your health care provider. What should I know about how menopause affects my mental health? Depression may occur at any age, but it is more common as you become older. Common symptoms of depression include:  Low or sad mood.  Changes in sleep patterns.  Changes in appetite or eating patterns.  Feeling an overall lack of motivation or enjoyment of activities that you previously enjoyed.  Frequent crying spells.  Talk with your health care provider if you think that you are experiencing depression. What should I know about immunizations? It is important that you get and maintain your immunizations. These include:  Tetanus, diphtheria, and pertussis (Tdap) booster vaccine.  Influenza every year before the flu season begins.  Pneumonia vaccine.  Shingles vaccine.  Your health care provider may also recommend other immunizations. This information is not intended to replace advice given to you by your health care provider. Make sure you discuss any questions you have with your health care provider. Document Released: 05/19/2005 Document Revised: 10/15/2015 Document Reviewed: 12/29/2014 Elsevier Interactive Patient Education  2018 [place] NOW OFFER   [employer] FAST TRACK!!!  SAME DAY Appointments for ACUTE CARE  Such as: Sprains, Injuries, cuts, abrasions, rashes, muscle pain, joint pain, back pain Colds, flu, sore throats, headache, allergies, cough, fever  Ear pain, sinus and eye infections Abdominal pain, nausea, vomiting, diarrhea, upset stomach Animal/insect bites  3 Easy Ways to  Schedule: Walk-In Scheduling Call in scheduling Mychart Sign-up: [email or web address]           No Follow-up on file.  Colin Benton R., DO

## 2016-09-28 ENCOUNTER — Encounter: Payer: Self-pay | Admitting: Family Medicine

## 2016-09-28 ENCOUNTER — Ambulatory Visit (INDEPENDENT_AMBULATORY_CARE_PROVIDER_SITE_OTHER): Payer: BLUE CROSS/BLUE SHIELD | Admitting: Family Medicine

## 2016-09-28 VITALS — BP 122/78 | HR 77 | Temp 97.4°F | Ht 63.0 in | Wt 156.2 lb

## 2016-09-28 DIAGNOSIS — F3341 Major depressive disorder, recurrent, in partial remission: Secondary | ICD-10-CM | POA: Diagnosis not present

## 2016-09-28 DIAGNOSIS — Z0000 Encounter for general adult medical examination without abnormal findings: Secondary | ICD-10-CM

## 2016-09-28 DIAGNOSIS — E538 Deficiency of other specified B group vitamins: Secondary | ICD-10-CM | POA: Diagnosis not present

## 2016-09-28 DIAGNOSIS — R739 Hyperglycemia, unspecified: Secondary | ICD-10-CM | POA: Diagnosis not present

## 2016-09-28 DIAGNOSIS — E039 Hypothyroidism, unspecified: Secondary | ICD-10-CM | POA: Diagnosis not present

## 2016-09-28 LAB — HEMOGLOBIN A1C: Hgb A1c MFr Bld: 6.2 % (ref 4.6–6.5)

## 2016-09-28 LAB — LIPID PANEL
CHOLESTEROL: 253 mg/dL — AB (ref 0–200)
HDL: 71 mg/dL (ref >39.00)
LDL Cholesterol: 163 mg/dL — ABNORMAL HIGH (ref 0–99)
NonHDL: 181.71
TRIGLYCERIDES: 92 mg/dL (ref 0.0–149.0)
Total CHOL/HDL Ratio: 4
VLDL: 18.4 mg/dL (ref 0.0–40.0)

## 2016-09-28 LAB — TSH: TSH: 2.06 u[IU]/mL (ref 0.35–4.50)

## 2016-09-28 LAB — VITAMIN B12: Vitamin B-12: 632 pg/mL (ref 211–911)

## 2016-09-28 MED ORDER — LEVOTHYROXINE SODIUM 75 MCG PO TABS: 75.0000 ug | ORAL_TABLET | Freq: Every day | ORAL | 3 refills | Status: DC

## 2016-09-28 MED ORDER — CITALOPRAM HYDROBROMIDE 40 MG PO TABS: 40.0000 mg | ORAL_TABLET | Freq: Every day | ORAL | 3 refills | Status: DC

## 2016-09-28 NOTE — Patient Instructions (Addendum)
BEFORE YOU LEAVE: -TGG2 -labs -follow up in 4-6 months  We have ordered labs or studies at this visit. It can take up to 1-2 weeks for results and processing. IF results require follow up or explanation, we will call you with instructions. Clinically stable results will be released to your [facility]. If you have not heard from [place] or cannot find your results in [facility] in 2 weeks please contact our office at [phone].  If you are not yet signed up for [facility], please consider signing up.  Continue counseling, celexa, a healthy diet and regular exercise. Please schedule follow up soon if any worsening of mood.  Advise regular aerobic exercise (at least 150 minutes per week of sweaty exercise) and a healthy diet. Try to eat at least 5-9 servings of vegetables and fruits per day (not corn, potatoes or bananas.) Avoid sweets, red meat, pork, butter, fried foods, fast food, processed food, excessive dairy, eggs and coconut. Replace bad fats with good fats - fish, nuts and seeds, canola oil, olive oil.    Health Maintenance for Postmenopausal Women Menopause is a normal process in which your reproductive ability comes to an end. This process happens gradually over a span of months to years, usually between the ages of 25 and 27. Menopause is complete when you have missed 12 consecutive menstrual periods. It is important to talk with your health care provider about some of the most common conditions that affect postmenopausal women, such as heart disease, cancer, and bone loss (osteoporosis). Adopting a healthy lifestyle and getting preventive care can help to promote your health and wellness. Those actions can also lower your chances of developing some of these common conditions. What should I know about menopause? During menopause, you may experience a number of symptoms, such as:  Moderate-to-severe hot flashes.  Night sweats.  Decrease in sex drive.  Mood  swings.  Headaches.  Tiredness.  Irritability.  Memory problems.  Insomnia.  Choosing to treat or not to treat menopausal changes is an individual decision that you make with your health care provider. What should I know about hormone replacement therapy and supplements? Hormone therapy products are effective for treating symptoms that are associated with menopause, such as hot flashes and night sweats. Hormone replacement carries certain risks, especially as you become older. If you are thinking about using estrogen or estrogen with progestin treatments, discuss the benefits and risks with your health care provider. What should I know about heart disease and stroke? Heart disease, heart attack, and stroke become more likely as you age. This may be due, in part, to the hormonal changes that your body experiences during menopause. These can affect how your body processes dietary fats, triglycerides, and cholesterol. Heart attack and stroke are both medical emergencies. There are many things that you can do to help prevent heart disease and stroke:  Have your blood pressure checked at least every 1-2 years. High blood pressure causes heart disease and increases the risk of stroke.  If you are 70-38 years old, ask your health care provider if you should take aspirin to prevent a heart attack or a stroke.  Do not use any tobacco products, including cigarettes, chewing tobacco, or electronic cigarettes. If you need help quitting, ask your health care provider.  It is important to eat a healthy diet and maintain a healthy weight. ? Be sure to include plenty of vegetables, fruits, low-fat dairy products, and lean protein. ? Avoid eating foods that are high in solid  fats, added sugars, or salt (sodium).  Get regular exercise. This is one of the most important things that you can do for your health. ? Try to exercise for at least 150 minutes each week. The type of exercise that you do should  increase your heart rate and make you sweat. This is known as moderate-intensity exercise. ? Try to do strengthening exercises at least twice each week. Do these in addition to the moderate-intensity exercise.  Know your numbers.Ask your health care provider to check your cholesterol and your blood glucose. Continue to have your blood tested as directed by your health care provider.  What should I know about cancer screening? There are several types of cancer. Take the following steps to reduce your risk and to catch any cancer development as early as possible. Breast Cancer  Practice breast self-awareness. ? This means understanding how your breasts normally appear and feel. ? It also means doing regular breast self-exams. Let your health care provider know about any changes, no matter how small.  If you are 23 or older, have a clinician do a breast exam (clinical breast exam or CBE) every year. Depending on your age, family history, and medical history, it may be recommended that you also have a yearly breast X-ray (mammogram).  If you have a family history of breast cancer, talk with your health care provider about genetic screening.  If you are at high risk for breast cancer, talk with your health care provider about having an MRI and a mammogram every year.  Breast cancer (BRCA) gene test is recommended for women who have family members with BRCA-related cancers. Results of the assessment will determine the need for genetic counseling and BRCA1 and for BRCA2 testing. BRCA-related cancers include these types: ? Breast. This occurs in males or females. ? Ovarian. ? Tubal. This may also be called fallopian tube cancer. ? Cancer of the abdominal or pelvic lining (peritoneal cancer). ? Prostate. ? Pancreatic.  Cervical, Uterine, and Ovarian Cancer Your health care provider may recommend that you be screened regularly for cancer of the pelvic organs. These include your ovaries, uterus,  and vagina. This screening involves a pelvic exam, which includes checking for microscopic changes to the surface of your cervix (Pap test).  For women ages 21-65, health care providers may recommend a pelvic exam and a Pap test every three years. For women ages 21-65, they may recommend the Pap test and pelvic exam, combined with testing for human papilloma virus (HPV), every five years. Some types of HPV increase your risk of cervical cancer. Testing for HPV may also be done on women of any age who have unclear Pap test results.  Other health care providers may not recommend any screening for nonpregnant women who are considered low risk for pelvic cancer and have no symptoms. Ask your health care provider if a screening pelvic exam is right for you.  If you have had past treatment for cervical cancer or a condition that could lead to cancer, you need Pap tests and screening for cancer for at least 20 years after your treatment. If Pap tests have been discontinued for you, your risk factors (such as having a new sexual partner) need to be reassessed to determine if you should start having screenings again. Some women have medical problems that increase the chance of getting cervical cancer. In these cases, your health care provider may recommend that you have screening and Pap tests more often.  If you have a  family history of uterine cancer or ovarian cancer, talk with your health care provider about genetic screening.  If you have vaginal bleeding after reaching menopause, tell your health care provider.  There are currently no reliable tests available to screen for ovarian cancer.  Lung Cancer Lung cancer screening is recommended for adults 64-79 years old who are at high risk for lung cancer because of a history of smoking. A yearly low-dose CT scan of the lungs is recommended if you:  Currently smoke.  Have a history of at least 30 pack-years of smoking and you currently smoke or have quit  within the past 15 years. A pack-year is smoking an average of one pack of cigarettes per day for one year.  Yearly screening should:  Continue until it has been 15 years since you quit.  Stop if you develop a health problem that would prevent you from having lung cancer treatment.  Colorectal Cancer  This type of cancer can be detected and can often be prevented.  Routine colorectal cancer screening usually begins at age 69 and continues through age 25.  If you have risk factors for colon cancer, your health care provider may recommend that you be screened at an earlier age.  If you have a family history of colorectal cancer, talk with your health care provider about genetic screening.  Your health care provider may also recommend using home test kits to check for hidden blood in your stool.  A small camera at the end of a tube can be used to examine your colon directly (sigmoidoscopy or colonoscopy). This is done to check for the earliest forms of colorectal cancer.  Direct examination of the colon should be repeated every 5-10 years until age 70. However, if early forms of precancerous polyps or small growths are found or if you have a family history or genetic risk for colorectal cancer, you may need to be screened more often.  Skin Cancer  Check your skin from head to toe regularly.  Monitor any moles. Be sure to tell your health care provider: ? About any new moles or changes in moles, especially if there is a change in a mole's shape or color. ? If you have a mole that is larger than the size of a pencil eraser.  If any of your family members has a history of skin cancer, especially at a young age, talk with your health care provider about genetic screening.  Always use sunscreen. Apply sunscreen liberally and repeatedly throughout the day.  Whenever you are outside, protect yourself by wearing long sleeves, pants, a wide-brimmed hat, and sunglasses.  What should I know  about osteoporosis? Osteoporosis is a condition in which bone destruction happens more quickly than new bone creation. After menopause, you may be at an increased risk for osteoporosis. To help prevent osteoporosis or the bone fractures that can happen because of osteoporosis, the following is recommended:  If you are 67-68 years old, get at least 1,000 mg of calcium and at least 600 mg of vitamin D per day.  If you are older than age 71 but younger than age 60, get at least 1,200 mg of calcium and at least 600 mg of vitamin D per day.  If you are older than age 2, get at least 1,200 mg of calcium and at least 800 mg of vitamin D per day.  Smoking and excessive alcohol intake increase the risk of osteoporosis. Eat foods that are rich in calcium and vitamin  D, and do weight-bearing exercises several times each week as directed by your health care provider. What should I know about how menopause affects my mental health? Depression may occur at any age, but it is more common as you become older. Common symptoms of depression include:  Low or sad mood.  Changes in sleep patterns.  Changes in appetite or eating patterns.  Feeling an overall lack of motivation or enjoyment of activities that you previously enjoyed.  Frequent crying spells.  Talk with your health care provider if you think that you are experiencing depression. What should I know about immunizations? It is important that you get and maintain your immunizations. These include:  Tetanus, diphtheria, and pertussis (Tdap) booster vaccine.  Influenza every year before the flu season begins.  Pneumonia vaccine.  Shingles vaccine.  Your health care provider may also recommend other immunizations. This information is not intended to replace advice given to you by your health care provider. Make sure you discuss any questions you have with your health care provider. Document Released: 05/19/2005 Document Revised: 10/15/2015  Document Reviewed: 12/29/2014 Elsevier Interactive Patient Education  2018 [employer] NOW OFFER   [facility] FAST TRACK!!!  SAME DAY Appointments for ACUTE CARE  Such as: Sprains, Injuries, cuts, abrasions, rashes, muscle pain, joint pain, back pain Colds, flu, sore throats, headache, allergies, cough, fever  Ear pain, sinus and eye infections Abdominal pain, nausea, vomiting, diarrhea, upset stomach Animal/insect bites  3 Easy Ways to Schedule: Walk-In Scheduling Call in scheduling Mychart Sign-up: [email or web address]

## 2016-10-03 ENCOUNTER — Telehealth: Payer: Self-pay | Admitting: Family Medicine

## 2016-10-03 ENCOUNTER — Encounter: Payer: Self-pay | Admitting: *Deleted

## 2016-10-03 NOTE — Telephone Encounter (Signed)
See results note. 

## 2016-10-03 NOTE — Telephone Encounter (Signed)
Christine Valdez pt returned your call °

## 2016-10-09 ENCOUNTER — Other Ambulatory Visit: Payer: Self-pay | Admitting: *Deleted

## 2016-10-09 DIAGNOSIS — E7800 Pure hypercholesterolemia, unspecified: Secondary | ICD-10-CM

## 2016-10-25 ENCOUNTER — Other Ambulatory Visit: Payer: Self-pay | Admitting: Family Medicine

## 2016-10-25 DIAGNOSIS — E039 Hypothyroidism, unspecified: Secondary | ICD-10-CM

## 2016-10-25 MED ORDER — LEVOTHYROXINE SODIUM 75 MCG PO TABS: 75.0000 ug | ORAL_TABLET | Freq: Every day | ORAL | 3 refills | Status: DC

## 2016-10-25 NOTE — Telephone Encounter (Signed)
rx sent to [facility] on [address]

## 2016-10-25 NOTE — Telephone Encounter (Signed)
Pt need new Rx for levothyroxine  Pharm:  [facility] in [place]

## 2016-12-05 ENCOUNTER — Ambulatory Visit: Payer: BLUE CROSS/BLUE SHIELD | Admitting: Family Medicine

## 2016-12-12 ENCOUNTER — Telehealth: Payer: Self-pay | Admitting: Family Medicine

## 2016-12-12 MED ORDER — FLUTICASONE PROPIONATE 50 MCG/ACT NA SUSP: NASAL | 5 refills | Status: DC

## 2016-12-12 NOTE — Telephone Encounter (Signed)
Rx done. 

## 2016-12-12 NOTE — Telephone Encounter (Signed)
° ° °  Pt request refill of the following:  fluticasone (FLONASE) 50 MCG/ACT nasal spray   Phamacy: [facility] [address]

## 2016-12-12 NOTE — Telephone Encounter (Signed)
OK to fill   This , of course, is OTC- but she may have a plan where she benefits from rx. One to two sprays per nostril once daily prn allergy symptoms. With prn refills.

## 2016-12-22 ENCOUNTER — Other Ambulatory Visit: Payer: BLUE CROSS/BLUE SHIELD

## 2016-12-28 ENCOUNTER — Encounter: Payer: Self-pay | Admitting: Family Medicine

## 2016-12-29 ENCOUNTER — Ambulatory Visit: Payer: BLUE CROSS/BLUE SHIELD | Admitting: Family Medicine

## 2017-01-15 ENCOUNTER — Ambulatory Visit (INDEPENDENT_AMBULATORY_CARE_PROVIDER_SITE_OTHER): Payer: BLUE CROSS/BLUE SHIELD | Admitting: Family Medicine

## 2017-01-15 ENCOUNTER — Encounter: Payer: Self-pay | Admitting: Family Medicine

## 2017-01-15 VITALS — BP 118/62 | HR 83 | Temp 98.4°F | Resp 16 | Ht 63.0 in | Wt 157.8 lb

## 2017-01-15 DIAGNOSIS — M533 Sacrococcygeal disorders, not elsewhere classified: Secondary | ICD-10-CM | POA: Diagnosis not present

## 2017-01-15 MED ORDER — METHOCARBAMOL 500 MG PO TABS: 500.0000 mg | ORAL_TABLET | Freq: Every evening | ORAL | 0 refills | Status: DC | PRN

## 2017-01-15 MED ORDER — MELOXICAM 15 MG PO TABS: 15.0000 mg | ORAL_TABLET | Freq: Every day | ORAL | 0 refills | Status: DC

## 2017-01-15 MED ORDER — DICLOFENAC SODIUM 75 MG PO TBEC: 75.0000 mg | DELAYED_RELEASE_TABLET | Freq: Two times a day (BID) | ORAL | 0 refills | Status: DC

## 2017-01-15 NOTE — Progress Notes (Signed)
Subjective:    Patient ID: Christine Valdez, female    DOB: 1951-02-08, 66 y.o.   MRN: [id number]  01/15/2017  Hip Pain (onset: going on for quite some time, severe hip pain @ its worse 8/10 pain level with movement mainly upon getting up in the mornings. Tried ibuprofen and other meds to help but no relief.)    HPI This 66 y.o. female presents for evaluation of hip pain.  Several weeks ago, started L ppsterior hip pain; a lot at night and upon awakening.  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  Has worsened instead of get better.  B sides now involved and radiates out.  Pelvic tilt really worsens pain.  Ibuprofen and bengay.  Constipation chronic; taking colace; colonoscopy UTD.  Collene Mares is GI.  This is different. No radiation into legs; no numbness, tingling, weakness in legs.  No saddle paresthesias.  Pain does not worsen when constipation worsens; pain does not improve when has a bowel movement.  S/p B shoulder replacements by Dr. Veverly Fells.  BP Readings from Last 3 Encounters:  01/15/17 118/62  09/28/16 122/78  08/21/16 130/90   Wt Readings from Last 3 Encounters:  01/15/17 157 lb 12.8 oz (71.6 kg)  09/28/16 156 lb 3.2 oz (70.9 kg)  08/21/16 158 lb 11.2 oz (72 kg)   Immunization History  Administered Date(s) Administered  . Pneumococcal Polysaccharide-23 04/21/2014  . Tdap 06/24/2013    Review of Systems  Constitutional: Negative for chills, diaphoresis, fatigue and fever.  HENT: Negative for ear pain, postnasal drip, rhinorrhea, sinus pressure, sore throat and trouble swallowing.   Respiratory: Negative for cough and shortness of breath.   Cardiovascular: Negative for chest pain, palpitations and leg swelling.  Gastrointestinal: Positive for constipation. Negative for abdominal distention, abdominal pain, anal bleeding, blood in stool, diarrhea, nausea, rectal pain and vomiting.  Genitourinary: Negative for decreased urine volume, difficulty urinating and  dysuria.  Musculoskeletal: Positive for arthralgias, back pain and myalgias. Negative for gait problem and joint swelling.  Neurological: Negative for weakness and numbness.    Past Medical History:  Diagnosis Date  . Allergy    uses Flonase daily as needed and takes Loratadine daily   . Anemia    only after birth of 2nd child  . Arthritis   . Asthma    uses Albuterol daily as needed;exercise induced  . Cancer ([place]) 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, right cheek  . Depression    takes Celexa daily  . GERD (gastroesophageal reflux disease)    occ  . History of colon polyps   . History of shingles    5-6 yrs  . Hyperlipidemia   . Hypothyroid   . Insomnia    takes Restoril nightly as needed  . Interstitial cystitis   . Osteoarthritis   . Sjogren's syndrome ([place])   . Spinal headache    after 1st C/S, o2sat low after shoulder surgery after went to room(51%),hard to urinate)   Past Surgical History:  Procedure Laterality Date  . CARPAL TUNNEL RELEASE Right 05/2007  . CESAREAN SECTION  1981/1984   x 2  . COLONOSCOPY    . TONSILLECTOMY AND ADENOIDECTOMY  age 37  . TOTAL SHOULDER ARTHROPLASTY Right 07/04/2013   DR NORRIS  . TOTAL SHOULDER ARTHROPLASTY Right 07/04/2013   Procedure: RIGHT TOTAL SHOULDER ARTHROPLASTY;  Surgeon: Augustin Schooling, MD;  Location: [facility];  Service: Orthopedics;  Laterality: Right;  . TOTAL SHOULDER ARTHROPLASTY Left 03/12/2015   Procedure:  LEFT SHOULDER TOTAL SHOULDER ARTHROPLASTY;  Surgeon: Netta Cedars, MD;  Location: [facility];  Service: Orthopedics;  Laterality: Left;   Allergies  Allergen Reactions  . Diflucan [Fluconazole] Hives    unknown  . Thimerosal Hives  . Mercury Detox [Nutritional Supplements] Hives  . Influenza Vaccine Recombinant Rash    Mercury component in flu vaccine  . Levaquin [Levofloxacin] Rash  . Sulfa Antibiotics Rash   Current Outpatient Prescriptions on File Prior to Visit  Medication Sig Dispense Refill  . albuterol (PROVENTIL  HFA;VENTOLIN HFA) 108 (90 BASE) MCG/ACT inhaler Inhale 2 puffs into the lungs every 6 (six) hours as needed for shortness of breath.     . ASPERCREME LIDOCAINE EX Apply topically. For elbow pain    . cholecalciferol (VITAMIN D) 1000 UNITS tablet Take 1,000 Units by mouth daily.    . citalopram (CELEXA) 40 MG tablet Take 1 tablet (40 mg total) by mouth daily. 90 tablet 3  . docusate sodium (COLACE) 100 MG capsule Take 400 mg by mouth at bedtime.     . fluticasone (FLONASE) 50 MCG/ACT nasal spray Place 2 sprays into both nostrils daily as needed for allergies.    . fluticasone (FLONASE) 50 MCG/ACT nasal spray 1-2 sprays each nostril once a day as needed 16 g 5  . Ketotifen Fumarate (ALAWAY OP) Place 1 drop into both eyes daily as needed (for allergies).    Marland Kitchen levothyroxine (SYNTHROID, LEVOTHROID) 75 MCG tablet Take 1 tablet (75 mcg total) by mouth daily. 90 tablet 3  . loratadine (CLARITIN) 10 MG tablet Take 10 mg by mouth daily as needed for allergies.     Marland Kitchen temazepam (RESTORIL) 30 MG capsule TAKE ONE CAPSULE BY MOUTH AT BEDTIME AS NEEDED FOR SLEEP 30 capsule 1   No current facility-administered medications on file prior to visit.    Social History   Social History  . Marital status: Married    Spouse name: N/A  . Number of children: 2  . Years of education: N/A   Occupational History  . Not on file.   Social History Main Topics  . Smoking status: Never Smoker  . Smokeless tobacco: Never Used  . Alcohol use 0.0 oz/week     Comment: occ wine  . Drug use: No  . Sexual activity: Not Currently    Partners: Male    Birth control/ protection: Post-menopausal   Other Topics Concern  . 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 Breast cancer Sister 72       stage  4, chemo and radiation  . Esophageal cancer Sister   . Diabetes Brother   . Epilepsy Daughter   . Hypertension Mother   . Osteoporosis Mother   . Diabetes Father   . Hypertension Father   . Lung cancer Paternal Aunt        Objective:    BP 118/62 (BP Location: Left Arm, Patient Position: Sitting, Cuff Size: Normal)   Pulse 83   Temp 98.4 F (36.9 C) (Oral)   Resp 16   Ht 5\' 3"  (1.6 m)   Wt 157 lb 12.8 oz (71.6 kg)   LMP 04/11/2003 (Approximate)   SpO2 97%  BMI 27.95 kg/m  Physical Exam  Constitutional: She is oriented to person, place, and time. She appears well-developed and well-nourished. No distress.  HENT:  Head: Normocephalic and atraumatic.  Eyes: Pupils are equal, round, and reactive to light. Conjunctivae are normal.  Neck: Normal range of motion. Neck supple.  Cardiovascular: Normal rate, regular rhythm and normal heart sounds.  Exam reveals no gallop and no friction rub.   No murmur heard. Pulmonary/Chest: Effort normal and breath sounds normal. She has no wheezes. She has no rales.  Abdominal: Soft. Bowel sounds are normal. She exhibits no distension and no mass. There is no tenderness. There is no rebound and no guarding.  Musculoskeletal:       Right hip: Normal. She exhibits normal range of motion, normal strength, no tenderness, no bony tenderness and no swelling.       Left hip: Normal. She exhibits normal range of motion, normal strength, no tenderness, no bony tenderness and no swelling.       Lumbar back: She exhibits tenderness, bony tenderness and pain. She exhibits normal range of motion, no spasm and normal pulse.       Back:  Lumbar spine:  Non-tender midline; +tender SI regions B.  Straight leg raises equivocal B; toe and heel walking intact; marching intact; motor 5/5 BLE.  Full ROM lumbar spine without limitation.  Mild pain reproduced flexion>extension; mild pain with lateral bending and rotation side to side.     Neurological: She is alert and  oriented to person, place, and time.  Skin: She is not diaphoretic.  Psychiatric: She has a normal mood and affect. Her behavior is normal.  Nursing note and vitals reviewed.  No results found. Depression screen [facility] 2/9 01/15/2017 09/28/2016 09/13/2015 11/10/2014  Decreased Interest 0 0 0 0  Down, Depressed, Hopeless 0 0 0 1  PHQ - 2 Score 0 0 0 1   Fall Risk  01/15/2017 09/28/2016 09/13/2015  Falls in the past year? Yes No No  Number falls in past yr: 1 - -  Injury with Fall? No - -        Assessment & Plan:   1. Sacroiliac joint dysfunction of both sides     -New onset; rx for Diclofenac 75mg  bid and Robaxin qhs; home exercise program provided. -recommend rest and avoiding lifting two year old child repetitively during the day. -if no improvement in 3-4 weeks, refer to ortho for imaging and further evaluation.   No orders of the defined types were placed in this encounter.  Meds ordered this encounter  Medications  . meloxicam (MOBIC) 15 MG tablet    Sig: Take 1 tablet (15 mg total) by mouth daily.    Dispense:  30 tablet    Refill:  0  . methocarbamol (ROBAXIN) 500 MG tablet    Sig: Take 1-2 tablets (500-1,000 mg total) by mouth at bedtime as needed for muscle spasms.    Dispense:  60 tablet    Refill:  0  . diclofenac (VOLTAREN) 75 MG EC tablet    Sig: Take 1 tablet (75 mg total) by mouth 2 (two) times daily.    Dispense:  60 tablet    Refill:  0    No Follow-up on file.   Acsa Estey Elayne Guerin, M.D. Primary Care at [facility] previously [facility] [address] [place], [place]  [address] [phone] phone [phone] fax

## 2017-01-15 NOTE — Patient Instructions (Addendum)
IF you received an x-ray today, you will receive an invoice from [facility]. Please contact [facility] at [phone] with questions or concerns regarding your invoice.   IF you received labwork today, you will receive an invoice from [facility]. Please contact [facility] at [phone] with questions or concerns regarding your invoice.   Our billing staff will not be able to assist you with questions regarding bills from these companies.  You will be contacted with the lab results as soon as they are available. The fastest way to get your results is to activate your My Chart account. Instructions are located on the last page of this paperwork. If you have not heard from [place] regarding the results in 2 weeks, please contact this office.     Piriformis Syndrome Rehab Ask your health care provider which exercises are safe for you. Do exercises exactly as told by your health care provider and adjust them as directed. It is normal to feel mild stretching, pulling, tightness, or discomfort as you do these exercises, but you should stop right away if you feel sudden pain or your pain gets worse.Do not begin these exercises until told by your health care provider. Stretching and range of motion exercises These exercises warm up your muscles and joints and improve the movement and flexibility of your hip and pelvis. These exercises also help to relieve pain, numbness, and tingling. Exercise A: Hip rotators  1. Lie on your back on a firm surface. 2. Pull your left / right knee toward your same shoulder with your left / right hand until your knee is pointing toward the ceiling. Hold your left / right ankle with your other hand. 3. Keeping your knee steady, gently pull your left / right ankle toward your other shoulder until you feel a stretch in your buttocks. 4. Hold this position for __________ seconds. Repeat __________ times. Complete this stretch __________ times a  day. Exercise B: Hip extensors 1. Lie on your back on a firm surface. Both of your legs should be straight. 2. Pull your left / right knee to your chest. Hold your leg in this position by holding onto the back of your thigh or the front of your knee. 3. Hold this position for __________ seconds. 4. Slowly return to the starting position. Repeat __________ times. Complete this stretch __________ times a day. Strengthening exercises These exercises build strength and endurance in your hip and thigh muscles. Endurance is the ability to use your muscles for a long time, even after they get tired. Exercise C: Straight leg raises ( hip abductors) 1. Lie on your side with your left / right leg in the top position. Lie so your head, shoulder, knee, and hip line up. Bend your bottom knee to help you balance. 2. Lift your top leg up 4-6 inches (10-15 cm), keeping your toes pointed straight ahead. 3. Hold this position for __________ seconds. 4. Slowly lower your leg to the starting position. Let your muscles relax completely. Repeat __________ times. Complete this exercise__________ times a day. Exercise D: Hip abductors and rotators, quadruped  1. Get on your hands and knees on a firm, lightly padded surface. Your hands should be directly below your shoulders, and your knees should be directly below your hips. 2. Lift your left / right knee out to the side. Keep your knee bent. Do not twist your body. 3. Hold this position for __________ seconds. 4. Slowly lower your leg. Repeat __________ times. Complete this  exercise__________ times a day. Exercise E: Straight leg raises ( hip extensors) 1. Lie on your abdomen on a bed or a firm surface with a pillow under your hips. 2. Squeeze your buttock muscles and lift your left / right thigh off the bed. Do not let your back arch. 3. Hold this position for __________ seconds. 4. Slowly return to the starting position. Let your muscles relax completely  before doing another repetition. Repeat __________ times. Complete this exercise__________ times a day. This information is not intended to replace advice given to you by your health care provider. Make sure you discuss any questions you have with your health care provider. Document Released: 03/27/2005 Document Revised: 11/30/2015 Document Reviewed: 03/09/2015 Elsevier Interactive Patient Education  2018 [employer]. Sacroiliac Joint Dysfunction Sacroiliac joint dysfunction is a condition that causes inflammation on one or both sides of the sacroiliac (SI) joint. The SI joint connects the lower part of the spine (sacrum) with the two upper portions of the pelvis (ilium). This condition causes deep aching or burning pain in the low back. In some cases, the pain may also spread into one or both buttocks or hips or spread down the legs. What are the causes? This condition may be caused by:  Pregnancy. During pregnancy, extra stress is put on the SI joints because the pelvis widens.  Injury, such as: ? Car accidents. ? Sport-related injuries. ? Work-related injuries.  Having one leg that is shorter than the other.  Conditions that affect the joints, such as: ? Rheumatoid arthritis. ? Gout. ? Psoriatic arthritis. ? Joint infection (septic arthritis).  Sometimes, the cause of SI joint dysfunction is not known. What are the signs or symptoms? Symptoms of this condition include:  Aching or burning pain in the lower back. The pain may also spread to other areas, such as: ? Buttocks. ? Groin. ? Thighs and legs.  Muscle spasms in or around the painful areas.  Increased pain when standing, walking, running, stair climbing, bending, or lifting.  How is this diagnosed? Your health care provider will do a physical exam and take your medical history. During the exam, the health care provider may move one or both of your legs to different positions to check for pain. Various tests may be  done to help verify the diagnosis, including:  Imaging tests to look for other causes of pain. These may include: ? MRI. ? CT scan. ? Bone scan.  Diagnostic injection. A numbing medicine is injected into the SI joint using a needle. If the pain is temporarily improved or stopped after the injection, this can indicate that SI joint dysfunction is the problem.  How is this treated? Treatment may vary depending on the cause and severity of your condition. Treatment options may include:  Applying ice or heat to the lower back area. This can help to reduce pain and muscle spasms.  Medicines to relieve pain or inflammation or to relax the muscles.  Wearing a back brace (sacroiliac brace) to help support the joint while your back is healing.  Physical therapy to increase muscle strength around the joint and flexibility at the joint. This may also involve learning proper body positions and ways of moving to relieve stress on the joint.  Direct manipulation of the SI joint.  Injections of steroid medicine into the joint in order to reduce pain and swelling.  Radiofrequency ablation to burn away nerves that are carrying pain messages from the joint.  Use of a device that provides  electrical stimulation in order to reduce pain at the joint.  Surgery to put in screws and plates that limit or prevent joint motion. This is rare.  Follow these instructions at home:  Rest as needed. Limit your activities as directed by your health care provider.  Take medicines only as directed by your health care provider.  If directed, apply ice to the affected area: ? Put ice in a plastic bag. ? Place a towel between your skin and the bag. ? Leave the ice on for 20 minutes, 2-3 times per day.  Use a heating pad or a moist heat pack as directed by your health care provider.  Exercise as directed by your health care provider or physical therapist.  Keep all follow-up visits as directed by your health  care provider. This is important. Contact a health care provider if:  Your pain is not controlled with medicine.  You have a fever.  You have increasingly severe pain. Get help right away if:  You have weakness, numbness, or tingling in your legs or feet.  You lose control of your bladder or bowel. This information is not intended to replace advice given to you by your health care provider. Make sure you discuss any questions you have with your health care provider. Document Released: 06/23/2008 Document Revised: 09/02/2015 Document Reviewed: 12/02/2013 Elsevier Interactive Patient Education  [employer].

## 2017-01-16 ENCOUNTER — Telehealth: Payer: Self-pay | Admitting: Family Medicine

## 2017-01-16 NOTE — Telephone Encounter (Signed)
Patient states she needs a muscle relaxer called into the [facility] on [address] because we sent in an antiinflammatory. She states the Robaxin is on back order and they need a different kind to give her.  Her call back number is  [phone]

## 2017-01-22 NOTE — Telephone Encounter (Signed)
Please advise 

## 2017-01-26 MED ORDER — TIZANIDINE HCL 4 MG PO TABS: 4.0000 mg | ORAL_TABLET | Freq: Four times a day (QID) | ORAL | 0 refills | Status: DC | PRN

## 2017-01-30 ENCOUNTER — Encounter: Payer: Self-pay | Admitting: Family Medicine

## 2017-02-19 ENCOUNTER — Encounter: Payer: Self-pay | Admitting: Family Medicine

## 2017-02-19 ENCOUNTER — Ambulatory Visit: Payer: BLUE CROSS/BLUE SHIELD | Admitting: Family Medicine

## 2017-02-19 ENCOUNTER — Other Ambulatory Visit: Payer: Self-pay

## 2017-02-19 VITALS — BP 132/80 | HR 88 | Temp 98.7°F | Resp 17 | Ht 63.0 in | Wt 159.6 lb

## 2017-02-19 DIAGNOSIS — M533 Sacrococcygeal disorders, not elsewhere classified: Secondary | ICD-10-CM | POA: Diagnosis not present

## 2017-02-19 DIAGNOSIS — M549 Dorsalgia, unspecified: Secondary | ICD-10-CM

## 2017-02-19 LAB — POCT URINALYSIS DIP (MANUAL ENTRY)
Bilirubin, UA: NEGATIVE
Glucose, UA: NEGATIVE mg/dL
Ketones, POC UA: NEGATIVE mg/dL
LEUKOCYTES UA: NEGATIVE
NITRITE UA: NEGATIVE
PH UA: 6.5 (ref 5.0–8.0)
PROTEIN UA: NEGATIVE mg/dL
RBC UA: NEGATIVE
Spec Grav, UA: 1.02 (ref 1.010–1.025)
UROBILINOGEN UA: 0.2 U/dL

## 2017-02-19 MED ORDER — TIZANIDINE HCL 4 MG PO TABS: 4.0000 mg | ORAL_TABLET | Freq: Four times a day (QID) | ORAL | 0 refills | Status: DC | PRN

## 2017-02-19 MED ORDER — DICLOFENAC SODIUM 75 MG PO TBEC: 75.0000 mg | DELAYED_RELEASE_TABLET | Freq: Two times a day (BID) | ORAL | 0 refills | Status: DC

## 2017-02-19 NOTE — Patient Instructions (Addendum)
IF you received an x-ray today, you will receive an invoice from [facility]. Please contact [facility] at [phone] with questions or concerns regarding your invoice.   IF you received labwork today, you will receive an invoice from [facility]. Please contact [facility] at [phone] with questions or concerns regarding your invoice.   Our billing staff will not be able to assist you with questions regarding bills from these companies.  You will be contacted with the lab results as soon as they are available. The fastest way to get your results is to activate your My Chart account. Instructions are located on the last page of this paperwork. If you have not heard from [place] regarding the results in 2 weeks, please contact this office.    Sacroiliac Joint Dysfunction Sacroiliac joint dysfunction is a condition that causes inflammation on one or both sides of the sacroiliac (SI) joint. The SI joint connects the lower part of the spine (sacrum) with the two upper portions of the pelvis (ilium). This condition causes deep aching or burning pain in the low back. In some cases, the pain may also spread into one or both buttocks or hips or spread down the legs. What are the causes? This condition may be caused by:  Pregnancy. During pregnancy, extra stress is put on the SI joints because the pelvis widens.  Injury, such as: ? Car accidents. ? Sport-related injuries. ? Work-related injuries.  Having one leg that is shorter than the other.  Conditions that affect the joints, such as: ? Rheumatoid arthritis. ? Gout. ? Psoriatic arthritis. ? Joint infection (septic arthritis).  Sometimes, the cause of SI joint dysfunction is not known. What are the signs or symptoms? Symptoms of this condition include:  Aching or burning pain in the lower back. The pain may also spread to other areas, such as: ? Buttocks. ? Groin. ? Thighs and legs.  Muscle spasms in or around  the painful areas.  Increased pain when standing, walking, running, stair climbing, bending, or lifting.  How is this diagnosed? Your health care provider will do a physical exam and take your medical history. During the exam, the health care provider may move one or both of your legs to different positions to check for pain. Various tests may be done to help verify the diagnosis, including:  Imaging tests to look for other causes of pain. These may include: ? MRI. ? CT scan. ? Bone scan.  Diagnostic injection. A numbing medicine is injected into the SI joint using a needle. If the pain is temporarily improved or stopped after the injection, this can indicate that SI joint dysfunction is the problem.  How is this treated? Treatment may vary depending on the cause and severity of your condition. Treatment options may include:  Applying ice or heat to the lower back area. This can help to reduce pain and muscle spasms.  Medicines to relieve pain or inflammation or to relax the muscles.  Wearing a back brace (sacroiliac brace) to help support the joint while your back is healing.  Physical therapy to increase muscle strength around the joint and flexibility at the joint. This may also involve learning proper body positions and ways of moving to relieve stress on the joint.  Direct manipulation of the SI joint.  Injections of steroid medicine into the joint in order to reduce pain and swelling.  Radiofrequency ablation to burn away nerves that are carrying pain messages from the joint.  Use  of a device that provides electrical stimulation in order to reduce pain at the joint.  Surgery to put in screws and plates that limit or prevent joint motion. This is rare.  Follow these instructions at home:  Rest as needed. Limit your activities as directed by your health care provider.  Take medicines only as directed by your health care provider.  If directed, apply ice to the affected  area: ? Put ice in a plastic bag. ? Place a towel between your skin and the bag. ? Leave the ice on for 20 minutes, 2-3 times per day.  Use a heating pad or a moist heat pack as directed by your health care provider.  Exercise as directed by your health care provider or physical therapist.  Keep all follow-up visits as directed by your health care provider. This is important. Contact a health care provider if:  Your pain is not controlled with medicine.  You have a fever.  You have increasingly severe pain. Get help right away if:  You have weakness, numbness, or tingling in your legs or feet.  You lose control of your bladder or bowel. This information is not intended to replace advice given to you by your health care provider. Make sure you discuss any questions you have with your health care provider. Document Released: 06/23/2008 Document Revised: 09/02/2015 Document Reviewed: 12/02/2013 Elsevier Interactive Patient Education  [employer].

## 2017-02-19 NOTE — Progress Notes (Signed)
Chief Complaint  Patient presents with  . Urinary Tract Infection    onset: over the weekend, previously tx [facility] for uti and having some of the same sxs again with back pain    HPI  Pt reports that she has been having uti symptoms Onset was 2-3 days ago  She has been treated in the past for UTI but is now  She completed treatment and states that she has been having urinary abnormalities as though the infection did not fully clear up.   4 review of systems  Past Medical History:  Diagnosis Date  . Allergy    uses Flonase daily as needed and takes Loratadine daily   . Anemia    only after birth of 2nd child  . Arthritis   . Asthma    uses Albuterol daily as needed;exercise induced  . 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 Depression    takes Celexa daily  . GERD (gastroesophageal reflux disease)    occ  . History of colon polyps   . History of shingles    5-6 yrs  . Hyperlipidemia   . Hypothyroid   . Insomnia    takes Restoril nightly as needed  . Interstitial cystitis   . Osteoarthritis   . Sjogren's syndrome ([place])   . Spinal headache    after 1st C/S, o2sat low after shoulder surgery after went to room(51%),hard to urinate)    Current Outpatient Medications  Medication Sig Dispense Refill  . albuterol (PROVENTIL HFA;VENTOLIN HFA) 108 (90 BASE) MCG/ACT inhaler Inhale 2 puffs into the lungs every 6 (six) hours as needed for shortness of breath.     . ASPERCREME LIDOCAINE EX Apply topically. For elbow pain    . cholecalciferol (VITAMIN D) 1000 UNITS tablet Take 1,000 Units by mouth daily.    . citalopram (CELEXA) 40 MG tablet Take 1 tablet (40 mg total) by mouth daily. 90 tablet 3  . docusate sodium (COLACE) 100 MG capsule Take 400 mg by mouth at bedtime.     . fluticasone (FLONASE) 50 MCG/ACT nasal spray 1-2 sprays each nostril once a day as needed 16 g 5  . Ketotifen Fumarate (ALAWAY OP) Place 1 drop into both eyes daily as needed (for allergies).     Marland Kitchen levothyroxine (SYNTHROID, LEVOTHROID) 75 MCG tablet Take 1 tablet (75 mcg total) by mouth daily. 90 tablet 3  . loratadine (CLARITIN) 10 MG tablet Take 10 mg by mouth daily as needed for allergies.     . meloxicam (MOBIC) 15 MG tablet Take 1 tablet (15 mg total) by mouth daily. 30 tablet 0  . methocarbamol (ROBAXIN) 500 MG tablet Take 1-2 tablets (500-1,000 mg total) by mouth at bedtime as needed for muscle spasms. 60 tablet 0  . temazepam (RESTORIL) 30 MG capsule TAKE ONE CAPSULE BY MOUTH AT BEDTIME AS NEEDED FOR SLEEP 30 capsule 1  . diclofenac (VOLTAREN) 75 MG EC tablet Take 1 tablet (75 mg total) 2 (two) times daily by mouth. 60 tablet 0  . tiZANidine (ZANAFLEX) 4 MG tablet Take 1 tablet (4 mg total) every 6 (six) hours as needed by mouth for muscle spasms. 60 tablet 0   No current facility-administered medications for this visit.     Allergies:  Allergies  Allergen Reactions  . Diflucan [Fluconazole] Hives    unknown  . Thimerosal Hives  . Mercury Detox [Nutritional Supplements] Hives  . Influenza Vaccine Recombinant Rash    Mercury component in  flu vaccine  . Levaquin [Levofloxacin] Rash  . Sulfa Antibiotics Rash    Past Surgical History:  Procedure Laterality Date  . CARPAL TUNNEL RELEASE Right 05/2007  . CESAREAN SECTION  1981/1984   x 2  . COLONOSCOPY    . TONSILLECTOMY AND ADENOIDECTOMY  age 81  . TOTAL SHOULDER ARTHROPLASTY Right 07/04/2013   DR Veverly Fells    Social History   Socioeconomic History  . Marital status: Married    Spouse name: None  . Number of children: 2  . Years of education: None  . Highest education level: None  Social Needs  . Financial resource strain: None  . Food insecurity - worry: None  . Food insecurity - inability: None  . Transportation needs - medical: None  . Transportation needs - non-medical: None  Occupational History  . None  Tobacco Use  . Smoking status: Never Smoker  . Smokeless tobacco: Never Used  Substance and  Sexual Activity  . Alcohol use: Yes    Alcohol/week: 0.0 oz    Comment: occ wine  . Drug use: No  . Sexual activity: Not Currently    Partners: Male    Birth control/protection: Post-menopausal  Other Topics Concern  . 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 Breast cancer Sister 66       stage 4, chemo and radiation  . Esophageal cancer Sister   . Diabetes Brother   . Epilepsy Daughter   . Hypertension Mother   . Osteoporosis Mother   . Diabetes Father   . Hypertension Father   . Lung cancer Paternal Aunt      ROS Review of Systems See HPI Constitution: No fevers or chills No malaise No diaphoresis Skin: No rash or itching Eyes: no blurry vision, no double vision GU: no dysuria or hematuria Neuro: no dizziness or headaches    Objective: Vitals:   02/19/17 1614  BP: 132/80  Pulse: 88  Resp: 17  Temp: 98.7 F (37.1 C)  TempSrc: Oral  SpO2: 96%  Weight: 159 lb 9.6 oz (72.4 kg)  Height: 5\' 3"  (1.6 m)    Physical Exam  Constitutional: She appears well-developed and well-nourished.  HENT:  Head: Normocephalic and atraumatic.  Cardiovascular: Normal rate, regular rhythm and normal heart sounds.  Pulmonary/Chest: Effort normal and breath sounds normal. No stridor. No respiratory distress.  Musculoskeletal:       Back:       Assessment and Plan Cyan was seen today for urinary tract infection.  Diagnoses and all orders for this visit:  Acute bilateral back pain, unspecified back location -     POCT urinalysis dipstick -     Urine Culture Likely continue sacroilitis  Will recheck urine culture  Sacroiliac joint dysfunction of both sides Advised pt to continue with [place]

## 2017-02-20 LAB — URINE CULTURE

## 2017-03-30 ENCOUNTER — Ambulatory Visit: Payer: BLUE CROSS/BLUE SHIELD | Admitting: Family Medicine

## 2017-04-19 ENCOUNTER — Encounter: Payer: Self-pay | Admitting: Family Medicine

## 2017-04-23 ENCOUNTER — Ambulatory Visit: Payer: BLUE CROSS/BLUE SHIELD | Admitting: Family Medicine

## 2017-04-23 ENCOUNTER — Other Ambulatory Visit: Payer: Self-pay

## 2017-04-23 ENCOUNTER — Ambulatory Visit (INDEPENDENT_AMBULATORY_CARE_PROVIDER_SITE_OTHER): Payer: BLUE CROSS/BLUE SHIELD | Admitting: Family Medicine

## 2017-04-23 ENCOUNTER — Encounter: Payer: Self-pay | Admitting: Family Medicine

## 2017-04-23 VITALS — BP 126/86 | HR 96 | Temp 98.6°F | Resp 16 | Ht 63.0 in | Wt 158.2 lb

## 2017-04-23 DIAGNOSIS — R7303 Prediabetes: Secondary | ICD-10-CM | POA: Diagnosis not present

## 2017-04-23 DIAGNOSIS — J069 Acute upper respiratory infection, unspecified: Secondary | ICD-10-CM | POA: Diagnosis not present

## 2017-04-23 LAB — POCT GLYCOSYLATED HEMOGLOBIN (HGB A1C): Hemoglobin A1C: 5.8

## 2017-04-23 NOTE — Progress Notes (Signed)
Chief Complaint  Patient presents with  . New Patient (Initial Visit)    establish care and wants to be checked for dm, and cold sxs that she thinks is turning into a sinus infection.  Episode of lightheadness, shakiness, dizziness and felt like she needed some sugar and something to eat, no energy an felt really sick.    HPI   prediabetes Pt reports that she would like to be checked for diabetes She reports that she a week ago had breakfast at [employer] She states that after breakfast she had a headache while in the car She took 2 ibuprofen After getting out of the car she felt lightheaded and shaky like she had not eaten She felt very hungry and shaky and felt dizzy This had happened before and she felt like her blood sugar just dropped She had some teddy grahams and noted that she drank some mountain dew She is aware that she is prediabetic and so she gave up soft drinks and started walking for exercise She states that diabetes run in the family in her brother and father  Component     Latest Ref Rng & Units 09/28/2016 04/23/2017  Hemoglobin A1C      6.2 5.8    Acute URI She also has a cold and upper respiratory infection  Duration for one week She has head and nasal congestion She also has postnasal drip She reports that she is drinking water and is doing mucinex and sudafed As well as flonase She reports that she gets recurrent sinus infection     Past Medical History:  Diagnosis Date  . Allergy    uses Flonase daily as needed and takes Loratadine daily   . Anemia    only after birth of 2nd child  . Arthritis   . Asthma    uses Albuterol daily as needed;exercise induced  . 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 Depression    takes Celexa daily  . GERD (gastroesophageal reflux disease)    occ  . History of colon polyps   . History of shingles    5-6 yrs  . Hyperlipidemia   . Hypothyroid   . Insomnia    takes Restoril nightly as needed  .  Interstitial cystitis   . Osteoarthritis   . Sjogren's syndrome ([place])   . Spinal headache    after 1st C/S, o2sat low after shoulder surgery after went to room(51%),hard to urinate)    Current Outpatient Medications  Medication Sig Dispense Refill  . albuterol (PROVENTIL HFA;VENTOLIN HFA) 108 (90 BASE) MCG/ACT inhaler Inhale 2 puffs into the lungs every 6 (six) hours as needed for shortness of breath.     . cholecalciferol (VITAMIN D) 1000 UNITS tablet Take 1,000 Units by mouth daily.    . citalopram (CELEXA) 40 MG tablet Take 1 tablet (40 mg total) by mouth daily. 90 tablet 3  . docusate sodium (COLACE) 100 MG capsule Take 400 mg by mouth at bedtime.     . fluticasone (FLONASE) 50 MCG/ACT nasal spray 1-2 sprays each nostril once a day as needed 16 g 5  . Ketotifen Fumarate (ALAWAY OP) Place 1 drop into both eyes daily as needed (for allergies).    Marland Kitchen levothyroxine (SYNTHROID, LEVOTHROID) 75 MCG tablet Take 1 tablet (75 mcg total) by mouth daily. 90 tablet 3  . loratadine (CLARITIN) 10 MG tablet Take 10 mg by mouth daily as needed for allergies.     Marland Kitchen temazepam (RESTORIL)  30 MG capsule TAKE ONE CAPSULE BY MOUTH AT BEDTIME AS NEEDED FOR SLEEP 30 capsule 1  . ASPERCREME LIDOCAINE EX Apply topically. For elbow pain     No current facility-administered medications for this visit.     Allergies:  Allergies  Allergen Reactions  . Diflucan [Fluconazole] Hives    unknown  . Thimerosal Hives  . Mercury Detox [Nutritional Supplements] Hives  . Influenza Vaccine Recombinant Rash    Mercury component in flu vaccine  . Levaquin [Levofloxacin] Rash  . Sulfa Antibiotics Rash    Past Surgical History:  Procedure Laterality Date  . CARPAL TUNNEL RELEASE Right 05/2007  . CESAREAN SECTION  1981/1984   x 2  . COLONOSCOPY    . TONSILLECTOMY AND ADENOIDECTOMY  age 14  . TOTAL SHOULDER ARTHROPLASTY Right 07/04/2013   DR NORRIS  . TOTAL SHOULDER ARTHROPLASTY Right 07/04/2013   Procedure: RIGHT  TOTAL SHOULDER ARTHROPLASTY;  Surgeon: Augustin Schooling, MD;  Location: [facility];  Service: Orthopedics;  Laterality: Right;  . TOTAL SHOULDER ARTHROPLASTY Left 03/12/2015   Procedure: LEFT SHOULDER TOTAL SHOULDER ARTHROPLASTY;  Surgeon: Netta Cedars, MD;  Location: [facility];  Service: Orthopedics;  Laterality: Left;    Social History   Socioeconomic History  . Marital status: Married    Spouse name: None  . Number of children: 2  . Years of education: None  . Highest education level: None  Social Needs  . Financial resource strain: None  . Food insecurity - worry: None  . Food insecurity - inability: None  . Transportation needs - medical: None  . Transportation needs - non-medical: None  Occupational History  . None  Tobacco Use  . Smoking status: Never Smoker  . Smokeless tobacco: Never Used  Substance and Sexual Activity  . Alcohol use: Yes    Alcohol/week: 0.0 oz    Comment: occ wine  . Drug use: No  . Sexual activity: Not Currently    Partners: Male    Birth control/protection: Post-menopausal  Other Topics Concern  . 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 Breast cancer Sister 39       stage 4, chemo and radiation  . Esophageal cancer Sister   . Diabetes Brother   . Epilepsy Daughter   . Hypertension Mother   . Osteoporosis Mother   . Diabetes Father   . Hypertension Father   . Lung cancer Paternal Aunt      ROS Review of Systems See HPI Constitution: No fevers or chills No malaise No diaphoresis Skin: No rash or itching Eyes: no blurry vision, no double vision GU: no dysuria or hematuria Neuro: + dizziness or headaches all others reviewed and negative   Objective: Vitals:   04/23/17 1121  BP: 126/86  Pulse: 96  Resp: 16  Temp: 98.6 F  (37 C)  TempSrc: Oral  SpO2: 96%  Weight: 158 lb 3.2 oz (71.8 kg)  Height: 5\' 3"  (1.6 m)    Physical Exam General: alert, oriented, in NAD Head: normocephalic, atraumatic, no sinus tenderness Eyes: EOM intact, no scleral icterus or conjunctival injection Ears: TM clear bilaterally Nose: mucosa nonerythematous, nonedematous Throat: no pharyngeal exudate or erythema Lymph: no posterior auricular, submental or  cervical lymph adenopathy Heart: normal rate, normal sinus rhythm, no murmurs Lungs: clear to auscultation bilaterally, no wheezing   Assessment and Plan Ronie was seen today for new patient (initial visit).  Diagnoses and all orders for this visit:  Prediabetes- a1c improved with exercise Discussed dietary changes Discussed proteins and complex carbs Handout provided -     POCT glycosylated hemoglobin (Hb A1C)  Acute URI- discussed supportive care Sinus rinse, flonase twice a day, sudafed and mucinex   A total of 30 minutes were spent face-to-face with the patient during this encounter and over half of that time was spent on counseling and coordination of care.   [place]

## 2017-04-23 NOTE — Patient Instructions (Addendum)
Try the [facility]Med Sinus Rinse twice a day after a shower or after applying a warm towel to the face     IF you received an x-ray today, you will receive an invoice from [facility]. Please contact [facility] at [phone] with questions or concerns regarding your invoice.   IF you received labwork today, you will receive an invoice from [facility]. Please contact [facility] at [phone] with questions or concerns regarding your invoice.   Our billing staff will not be able to assist you with questions regarding bills from these companies.  You will be contacted with the lab results as soon as they are available. The fastest way to get your results is to activate your My Chart account. Instructions are located on the last page of this paperwork. If you have not heard from [place] regarding the results in 2 weeks, please contact this office.    Prediabetes Eating Plan Prediabetes-also called impaired glucose tolerance or impaired fasting glucose-is a condition that causes blood sugar (blood glucose) levels to be higher than normal. Following a healthy diet can help to keep prediabetes under control. It can also help to lower the risk of type 2 diabetes and heart disease, which are increased in people who have prediabetes. Along with regular exercise, a healthy diet:  Promotes weight loss.  Helps to control blood sugar levels.  Helps to improve the way that the body uses insulin.  What do I need to know about this eating plan?  Use the glycemic index (GI) to plan your meals. The index tells you how quickly a food will raise your blood sugar. Choose low-GI foods. These foods take a longer time to raise blood sugar.  Pay close attention to the amount of carbohydrates in the food that you eat. Carbohydrates increase blood sugar levels.  Keep track of how many calories you take in. Eating the right amount of calories will help you to achieve a healthy weight. Losing about 7 percent  of your starting weight can help to prevent type 2 diabetes.  You may want to follow a Mediterranean diet. This diet includes a lot of vegetables, lean meats or fish, whole grains, fruits, and healthy oils and fats. What foods can I eat? Grains Whole grains, such as whole-wheat or whole-grain breads, crackers, cereals, and pasta. Unsweetened oatmeal. Bulgur. Barley. Quinoa. Brown rice. Corn or whole-wheat flour tortillas or taco shells. Vegetables Lettuce. Spinach. Peas. Beets. Cauliflower. Cabbage. Broccoli. Carrots. Tomatoes. Squash. Eggplant. Herbs. Peppers. Onions. Cucumbers. Brussels sprouts. Fruits Berries. Bananas. Apples. Oranges. Grapes. Papaya. Mango. Pomegranate. Kiwi. Grapefruit. Cherries. Meats and Other Protein Sources Seafood. Lean meats, such as chicken and [place] or lean cuts of pork and beef. Tofu. Eggs. Nuts. Beans. Dairy Low-fat or fat-free dairy products, such as yogurt, cottage cheese, and cheese. Beverages Water. Tea. Coffee. Sugar-free or diet soda. Seltzer water. Milk. Milk alternatives, such as soy or almond milk. Condiments Mustard. Relish. Low-fat, low-sugar ketchup. Low-fat, low-sugar barbecue sauce. Low-fat or fat-free mayonnaise. Sweets and Desserts Sugar-free or low-fat pudding. Sugar-free or low-fat ice cream and other frozen treats. Fats and Oils Avocado. Walnuts. Olive oil. The items listed above may not be a complete list of recommended foods or beverages. Contact your dietitian for more options. What foods are not recommended? Grains Refined white flour and flour products, such as bread, pasta, snack foods, and cereals. Beverages Sweetened drinks, such as sweet iced tea and soda. Sweets and Desserts Baked goods, such as cake, cupcakes, pastries, cookies, and cheesecake. The  items listed above may not be a complete list of foods and beverages to avoid. Contact your dietitian for more information. This information is not intended to replace advice  given to you by your health care provider. Make sure you discuss any questions you have with your health care provider. Document Released: 08/11/2014 Document Revised: 09/02/2015 Document Reviewed: 04/22/2014 Elsevier Interactive Patient Education  2017 Elsevier Inc.  Sinusitis, Adult Sinusitis is soreness and inflammation of your sinuses. Sinuses are hollow spaces in the bones around your face. Your sinuses are located:  Around your eyes.  In the middle of your forehead.  Behind your nose.  In your cheekbones.  Your sinuses and nasal passages are lined with a stringy fluid (mucus). Mucus normally drains out of your sinuses. When your nasal tissues become inflamed or swollen, the mucus can become trapped or blocked so air cannot flow through your sinuses. This allows bacteria, viruses, and funguses to grow, which leads to infection. Sinusitis can develop quickly and last for 7?10 days (acute) or for more than 12 weeks (chronic). Sinusitis often develops after a cold. What are the causes? This condition is caused by anything that creates swelling in the sinuses or stops mucus from draining, including:  Allergies.  Asthma.  Bacterial or viral infection.  Abnormally shaped bones between the nasal passages.  Nasal growths that contain mucus (nasal polyps).  Narrow sinus openings.  Pollutants, such as chemicals or irritants in the air.  A foreign object stuck in the nose.  A fungal infection. This is rare.  What increases the risk? The following factors may make you more likely to develop this condition:  Having allergies or asthma.  Having had a recent cold or respiratory tract infection.  Having structural deformities or blockages in your nose or sinuses.  Having a weak immune system.  Doing a lot of swimming or diving.  Overusing nasal sprays.  Smoking.  What are the signs or symptoms? The main symptoms of this condition are pain and a feeling of pressure around  the affected sinuses. Other symptoms include:  Upper toothache.  Earache.  Headache.  Bad breath.  Decreased sense of smell and taste.  A cough that may get worse at night.  Fatigue.  Fever.  Thick drainage from your nose. The drainage is often green and it may contain pus (purulent).  Stuffy nose or congestion.  Postnasal drip. This is when extra mucus collects in the throat or back of the nose.  Swelling and warmth over the affected sinuses.  Sore throat.  Sensitivity to light.  How is this diagnosed? This condition is diagnosed based on symptoms, a medical history, and a physical exam. To find out if your condition is acute or chronic, your health care provider may:  Look in your nose for signs of nasal polyps.  Tap over the affected sinus to check for signs of infection.  View the inside of your sinuses using an imaging device that has a light attached (endoscope).  If your health care provider suspects that you have chronic sinusitis, you may also:  Be tested for allergies.  Have a sample of mucus taken from your nose (nasal culture) and checked for bacteria.  Have a mucus sample examined to see if your sinusitis is related to an allergy.  If your sinusitis does not respond to treatment and it lasts longer than 8 weeks, you may have an MRI or CT scan to check your sinuses. These scans also help to determine how  severe your infection is. In rare cases, a bone biopsy may be done to rule out more serious types of fungal sinus disease. How is this treated? Treatment for sinusitis depends on the cause and whether your condition is chronic or acute. If a virus is causing your sinusitis, your symptoms will go away on their own within 10 days. You may be given medicines to relieve your symptoms, including:  Topical nasal decongestants. They shrink swollen nasal passages and let mucus drain from your sinuses.  Antihistamines. These drugs block inflammation that is  triggered by allergies. This can help to ease swelling in your nose and sinuses.  Topical nasal corticosteroids. These are nasal sprays that ease inflammation and swelling in your nose and sinuses.  Nasal saline washes. These rinses can help to get rid of thick mucus in your nose.  If your condition is caused by bacteria, you will be given an antibiotic medicine. If your condition is caused by a fungus, you will be given an antifungal medicine. Surgery may be needed to correct underlying conditions, such as narrow nasal passages. Surgery may also be needed to remove polyps. Follow these instructions at home: Medicines  Take, use, or apply over-the-counter and prescription medicines only as told by your health care provider. These may include nasal sprays.  If you were prescribed an antibiotic medicine, take it as told by your health care provider. Do not stop taking the antibiotic even if you start to feel better. Hydrate and Humidify  Drink enough water to keep your urine clear or pale yellow. Staying hydrated will help to thin your mucus.  Use a cool mist humidifier to keep the humidity level in your home above 50%.  Inhale steam for 10-15 minutes, 3-4 times a day or as told by your health care provider. You can do this in the bathroom while a hot shower is running.  Limit your exposure to cool or dry air. Rest  Rest as much as possible.  Sleep with your head raised (elevated).  Make sure to get enough sleep each night. General instructions  Apply a warm, moist washcloth to your face 3-4 times a day or as told by your health care provider. This will help with discomfort.  Wash your hands often with soap and water to reduce your exposure to viruses and other germs. If soap and water are not available, use hand sanitizer.  Do not smoke. Avoid being around people who are smoking (secondhand smoke).  Keep all follow-up visits as told by your health care provider. This is  important. Contact a health care provider if:  You have a fever.  Your symptoms get worse.  Your symptoms do not improve within 10 days. Get help right away if:  You have a severe headache.  You have persistent vomiting.  You have pain or swelling around your face or eyes.  You have vision problems.  You develop confusion.  Your neck is stiff.  You have trouble breathing. This information is not intended to replace advice given to you by your health care provider. Make sure you discuss any questions you have with your health care provider. Document Released: 03/27/2005 Document Revised: 11/21/2015 Document Reviewed: 01/20/2015 Elsevier Interactive Patient Education  [employer].

## 2017-04-30 ENCOUNTER — Encounter: Payer: Self-pay | Admitting: Family Medicine

## 2017-04-30 ENCOUNTER — Other Ambulatory Visit: Payer: Self-pay

## 2017-04-30 ENCOUNTER — Ambulatory Visit: Payer: BLUE CROSS/BLUE SHIELD | Admitting: Family Medicine

## 2017-04-30 ENCOUNTER — Ambulatory Visit (INDEPENDENT_AMBULATORY_CARE_PROVIDER_SITE_OTHER): Payer: BLUE CROSS/BLUE SHIELD

## 2017-04-30 VITALS — BP 108/78 | HR 96 | Temp 99.6°F | Ht 63.78 in | Wt 157.6 lb

## 2017-04-30 DIAGNOSIS — R05 Cough: Secondary | ICD-10-CM | POA: Diagnosis not present

## 2017-04-30 DIAGNOSIS — R059 Cough, unspecified: Secondary | ICD-10-CM

## 2017-04-30 DIAGNOSIS — J101 Influenza due to other identified influenza virus with other respiratory manifestations: Secondary | ICD-10-CM

## 2017-04-30 LAB — POC INFLUENZA A&B (BINAX/QUICKVUE)
Influenza A, POC: POSITIVE — AB
Influenza B, POC: NEGATIVE

## 2017-04-30 LAB — POCT CBC
Granulocyte percent: 74.3 %G (ref 37–80)
HCT, POC: 37.8 % (ref 37.7–47.9)
Hemoglobin: 12.8 g/dL (ref 12.2–16.2)
Lymph, poc: 0.6 (ref 0.6–3.4)
MCH, POC: 29.7 pg (ref 27–31.2)
MCHC: 33.9 g/dL (ref 31.8–35.4)
MCV: 87.6 fL (ref 80–97)
MID (cbc): 0.3 (ref 0–0.9)
MPV: 6.9 fL (ref 0–99.8)
POC Granulocyte: 2.7 (ref 2–6.9)
POC LYMPH PERCENT: 17.3 %L (ref 10–50)
POC MID %: 8.4 %M (ref 0–12)
Platelet Count, POC: 177 10*3/uL (ref 142–424)
RBC: 4.32 M/uL (ref 4.04–5.48)
RDW, POC: 12.5 %
WBC: 3.7 10*3/uL — AB (ref 4.6–10.2)

## 2017-04-30 MED ORDER — OSELTAMIVIR PHOSPHATE 75 MG PO CAPS: 75.0000 mg | ORAL_CAPSULE | Freq: Two times a day (BID) | ORAL | 0 refills | Status: DC

## 2017-04-30 MED ORDER — BENZONATATE 200 MG PO CAPS: 200.0000 mg | ORAL_CAPSULE | Freq: Two times a day (BID) | ORAL | 0 refills | Status: DC | PRN

## 2017-04-30 NOTE — Patient Instructions (Addendum)
   IF you received an x-ray today, you will receive an invoice from [facility]. Please contact [facility] at [phone] with questions or concerns regarding your invoice.   IF you received labwork today, you will receive an invoice from [facility]. Please contact [facility] at [phone] with questions or concerns regarding your invoice.   Our billing staff will not be able to assist you with questions regarding bills from these companies.  You will be contacted with the lab results as soon as they are available. The fastest way to get your results is to activate your My Chart account. Instructions are located on the last page of this paperwork. If you have not heard from us regarding the results in 2 weeks, please contact this office.      Influenza, Adult Influenza, more commonly known as "the flu," is a viral infection that primarily affects the respiratory tract. The respiratory tract includes organs that help you breathe, such as the lungs, nose, and throat. The flu causes many common cold symptoms, as well as a high fever and body aches. The flu spreads easily from person to person (is contagious). Getting a flu shot (influenza vaccination) every year is the best way to prevent influenza. What are the causes? Influenza is caused by a virus. You can catch the virus by:  Breathing in droplets from an infected person's cough or sneeze.  Touching something that was recently contaminated with the virus and then touching your mouth, nose, or eyes.  What increases the risk? The following factors may make you more likely to get the flu:  Not cleaning your hands frequently with soap and water or alcohol-based hand sanitizer.  Having close contact with many people during cold and flu season.  Touching your mouth, eyes, or nose without washing or sanitizing your hands first.  Not drinking enough fluids or not eating a healthy diet.  Not getting enough sleep or  exercise.  Being under a high amount of stress.  Not getting a yearly (annual) flu shot.  You may be at a higher risk of complications from the flu, such as a severe lung infection (pneumonia), if you:  Are over the age of 65.  Are pregnant.  Have a weakened disease-fighting system (immune system). You may have a weakened immune system if you: ? Have HIV or AIDS. ? Are undergoing chemotherapy. ? Aretaking medicines that reduce the activity of (suppress) the immune system.  Have a long-term (chronic) illness, such as heart disease, kidney disease, diabetes, or lung disease.  Have a liver disorder.  Are obese.  Have anemia.  What are the signs or symptoms? Symptoms of this condition typically last 4-10 days and may include:  Fever.  Chills.  Headache, body aches, or muscle aches.  Sore throat.  Cough.  Runny or congested nose.  Chest discomfort and cough.  Poor appetite.  Weakness or tiredness (fatigue).  Dizziness.  Nausea or vomiting.  How is this diagnosed? This condition may be diagnosed based on your medical history and a physical exam. Your health care provider may do a nose or throat swab test to confirm the diagnosis. How is this treated? If influenza is detected early, you can be treated with antiviral medicine that can reduce the length of your illness and the severity of your symptoms. This medicine may be given by mouth (orally) or through an IV tube that is inserted in one of your veins. The goal of treatment is to relieve symptoms by taking   care of yourself at home. This may include taking over-the-counter medicines, drinking plenty of fluids, and adding humidity to the air in your home. In some cases, influenza goes away on its own. Severe influenza or complications from influenza may be treated in a hospital. Follow these instructions at home:  Take over-the-counter and prescription medicines only as told by your health care provider.  Use a  cool mist humidifier to add humidity to the air in your home. This can make breathing easier.  Rest as needed.  Drink enough fluid to keep your urine clear or pale yellow.  Cover your mouth and nose when you cough or sneeze.  Wash your hands with soap and water often, especially after you cough or sneeze. If soap and water are not available, use hand sanitizer.  Stay home from work or school as told by your health care provider. Unless you are visiting your health care provider, try to avoid leaving home until your fever has been gone for 24 hours without the use of medicine.  Keep all follow-up visits as told by your health care provider. This is important. How is this prevented?  Getting an annual flu shot is the best way to avoid getting the flu. You may get the flu shot in late summer, fall, or winter. Ask your health care provider when you should get your flu shot.  Wash your hands often or use hand sanitizer often.  Avoid contact with people who are sick during cold and flu season.  Eat a healthy diet, drink plenty of fluids, get enough sleep, and exercise regularly. Contact a health care provider if:  You develop new symptoms.  You have: ? Chest pain. ? Diarrhea. ? A fever.  Your cough gets worse.  You produce more mucus.  You feel nauseous or you vomit. Get help right away if:  You develop shortness of breath or difficulty breathing.  Your skin or nails turn a bluish color.  You have severe pain or stiffness in your neck.  You develop a sudden headache or sudden pain in your face or ear.  You cannot stop vomiting. This information is not intended to replace advice given to you by your health care provider. Make sure you discuss any questions you have with your health care provider. Document Released: 03/24/2000 Document Revised: 09/02/2015 Document Reviewed: 01/19/2015 Elsevier Interactive Patient Education  2017 Elsevier Inc.  

## 2017-04-30 NOTE — Progress Notes (Signed)
1/21/201912:05 PM  Sukaina Toothaker 06-Aug-1950, 67 y.o. female [id number]  Chief Complaint  Patient presents with  . URI    having flu like symptom with headache for 2 wks. Came in 1 wk ago for cold, the meds given are not working    HPI:   Patient is a 67 y.o. female who presents today for uri symptoms for about 2 weeks. However she was getting better until yesterday started having chills, body aches, headaches, worsening non productive cough. She has been taking ibuprofen, mucinex and sudafed, she has been pushing fluids. She does not smoke, has h/o intermittent asthma. She denies any ear pain, sore throat, vomiting, diarrhea or rash. She is allergic to component of flu vaccine so does not get vaccine.   Depression screen [facility] 2/9 04/30/2017 04/23/2017 02/19/2017  Decreased Interest 0 0 0  Down, Depressed, Hopeless 0 0 0  PHQ - 2 Score 0 0 0    Allergies  Allergen Reactions  . Diflucan [Fluconazole] Hives    unknown  . Thimerosal Hives  . Mercury Detox [Nutritional Supplements] Hives  . Influenza Vaccine Recombinant Rash    Mercury component in flu vaccine  . Levaquin [Levofloxacin] Rash  . Sulfa Antibiotics Rash    Prior to Admission medications   Medication Sig Start Date End Date Taking? Authorizing Provider  albuterol (PROVENTIL HFA;VENTOLIN HFA) 108 (90 BASE) MCG/ACT inhaler Inhale 2 puffs into the lungs every 6 (six) hours as needed for shortness of breath.    Yes [provider]  ASPERCREME LIDOCAINE EX Apply topically. For elbow pain   Yes [provider]  cholecalciferol (VITAMIN D) 1000 UNITS tablet Take 1,000 Units by mouth daily.   Yes [provider]  citalopram (CELEXA) 40 MG tablet Take 1 tablet (40 mg total) by mouth daily. 09/28/16  Yes Colin Benton R, DO  docusate sodium (COLACE) 100 MG capsule Take 400 mg by mouth at bedtime.    Yes [provider]  fluticasone (FLONASE) 50 MCG/ACT nasal spray 1-2 sprays each nostril  once a day as needed 12/12/16  Yes Burchette, Alinda Sierras, MD  Ketotifen Fumarate (ALAWAY OP) Place 1 drop into both eyes daily as needed (for allergies).   Yes [provider]  levothyroxine (SYNTHROID, LEVOTHROID) 75 MCG tablet Take 1 tablet (75 mcg total) by mouth daily. 10/25/16  Yes Lucretia Kern, DO  loratadine (CLARITIN) 10 MG tablet Take 10 mg by mouth daily as needed for allergies.    Yes [provider]  temazepam (RESTORIL) 30 MG capsule TAKE ONE CAPSULE BY MOUTH AT BEDTIME AS NEEDED FOR SLEEP 10/02/16  Yes Megan Salon, MD    Past Medical History:  Diagnosis Date  . Allergy    uses Flonase daily as needed and takes Loratadine daily   . Anemia    only after birth of 2nd child  . Arthritis   . Asthma    uses Albuterol daily as needed;exercise induced  . Cancer ([place]) 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, right cheek  . Depression    takes Celexa daily  . GERD (gastroesophageal reflux disease)    occ  . History of colon polyps   . History of shingles    5-6 yrs  . Hyperlipidemia   . Hypothyroid   . Insomnia    takes Restoril nightly as needed  . Interstitial cystitis   . Osteoarthritis   . Sjogren's syndrome ([place])   . Spinal headache    after 1st C/S,  o2sat low after shoulder surgery after went to room(51%),hard to urinate)    Past Surgical History:  Procedure Laterality Date  . CARPAL TUNNEL RELEASE Right 05/2007  . CESAREAN SECTION  1981/1984   x 2  . COLONOSCOPY    . TONSILLECTOMY AND ADENOIDECTOMY  age 72  . TOTAL SHOULDER ARTHROPLASTY Right 07/04/2013   DR NORRIS  . TOTAL SHOULDER ARTHROPLASTY Right 07/04/2013   Procedure: RIGHT TOTAL SHOULDER ARTHROPLASTY;  Surgeon: Augustin Schooling, MD;  Location: [place];  Service: Orthopedics;  Laterality: Right;  . TOTAL SHOULDER ARTHROPLASTY Left 03/12/2015   Procedure: LEFT SHOULDER TOTAL SHOULDER ARTHROPLASTY;  Surgeon: Netta Cedars, MD;  Location: [place];  Service: Orthopedics;  Laterality: Left;    Social History    Tobacco Use  . Smoking status: Never Smoker  . Smokeless tobacco: Never Used  Substance Use Topics  . Alcohol use: Yes    Alcohol/week: 0.0 oz    Comment: occ wine    Family History  Problem Relation Age of Onset  . Breast cancer Sister 33       stage 4, chemo and radiation  . Esophageal cancer Sister   . Diabetes Brother   . Epilepsy Daughter   . Hypertension Mother   . Osteoporosis Mother   . Diabetes Father   . Hypertension Father   . Lung cancer Paternal Aunt     Review of Systems  Constitutional: Positive for chills. Negative for fever.  HENT: Positive for congestion. Negative for ear pain, sinus pain and sore throat.   Respiratory: Positive for cough. Negative for hemoptysis, sputum production, shortness of breath and wheezing.   Cardiovascular: Negative for chest pain, palpitations and leg swelling.  Gastrointestinal: Negative for abdominal pain, diarrhea, nausea and vomiting.  Musculoskeletal: Positive for myalgias.  Skin: Negative for rash.  Neurological: Positive for headaches.   Per hpi  OBJECTIVE:  Blood pressure 108/78, pulse 96, temperature 99.6 F (37.6 C), temperature source Oral, height 5' 3.78" (1.62 m), weight 157 lb 9.6 oz (71.5 kg), last menstrual period 04/11/2003, SpO2 100 %.  Physical Exam  Constitutional: She is oriented to person, place, and time and well-developed, well-nourished, and in no distress. She has a sickly appearance.  HENT:  Head: Normocephalic and atraumatic.  Right Ear: Hearing, tympanic membrane, external ear and ear canal normal.  Left Ear: Hearing, tympanic membrane, external ear and ear canal normal.  Mouth/Throat: Oropharynx is clear and moist.  Eyes: EOM are normal. Pupils are equal, round, and reactive to light.  Neck: Neck supple.  Cardiovascular: Normal rate, regular rhythm and normal heart sounds. Exam reveals no gallop and no friction rub.  No murmur heard. Pulmonary/Chest: Effort normal and breath sounds  normal. She has no wheezes. She has no rales.  Lymphadenopathy:    She has no cervical adenopathy.  Neurological: She is alert and oriented to person, place, and time. Gait normal.  Skin: Skin is warm and dry.      Results for orders placed or performed in visit on 04/30/17 (from the past 24 hour(s))  POCT CBC     Status: Abnormal   Collection Time: 04/30/17 12:37 PM  Result Value Ref Range   WBC 3.7 (A) 4.6 - 10.2 K/uL   Lymph, poc 0.6 0.6 - 3.4   POC LYMPH PERCENT 17.3 10 - 50 %L   MID (cbc) 0.3 0 - 0.9   POC MID % 8.4 0 - 12 %M   POC Granulocyte 2.7 2 - 6.9  Granulocyte percent 74.3 37 - 80 %G   RBC 4.32 4.04 - 5.48 M/uL   Hemoglobin 12.8 12.2 - 16.2 g/dL   HCT, POC 37.8 37.7 - 47.9 %   MCV 87.6 80 - 97 fL   MCH, POC 29.7 27 - 31.2 pg   MCHC 33.9 31.8 - 35.4 g/dL   RDW, POC 12.5 %   Platelet Count, POC 177 142 - 424 K/uL   MPV 6.9 0 - 99.8 fL  POC Influenza A&B(BINAX/QUICKVUE)     Status: Abnormal   Collection Time: 04/30/17 12:41 PM  Result Value Ref Range   Influenza A, POC Positive (A) Negative   Influenza B, POC Negative Negative    Dg Chest 2 View  Result Date: 04/30/2017 CLINICAL DATA:  Two weeks of cough.  Worsening symptoms. EXAM: CHEST  2 VIEW COMPARISON:  09/13/2015 and 07/05/2013. FINDINGS: The heart size and mediastinal contours are normal. The lungs are clear. There is no pleural effusion or pneumothorax. No acute osseous findings are identified. Narrow AP diameter of the chest. Previous bilateral shoulder arthroplasty. IMPRESSION: Stable chest.  No active cardiopulmonary process. Electronically Signed   By: Richardean Sale M.D.   On: 04/30/2017 12:38     ASSESSMENT and PLAN  1. Influenza A Discussed supportive measures, new meds r/se/b and RTC precautions. Patient educational handout given.  2. Cough - POCT CBC - POC Influenza A&B(BINAX/QUICKVUE) - DG Chest 2 View; Future  Other orders - oseltamivir (TAMIFLU) 75 MG capsule; Take 1 capsule (75 mg  total) by mouth 2 (two) times daily. - benzonatate (TESSALON) 200 MG capsule; Take 1 capsule (200 mg total) by mouth 2 (two) times daily as needed for cough.  Return if symptoms worsen or fail to improve.    Rutherford Guys, MD [facility] at [place], [place] [address] Ph.  [phone] Fax [phone]

## 2017-05-10 ENCOUNTER — Ambulatory Visit: Payer: BLUE CROSS/BLUE SHIELD | Admitting: Family Medicine

## 2017-05-10 ENCOUNTER — Other Ambulatory Visit: Payer: Self-pay

## 2017-05-10 ENCOUNTER — Encounter: Payer: Self-pay | Admitting: Family Medicine

## 2017-05-10 VITALS — BP 116/80 | HR 94 | Temp 98.0°F | Resp 16 | Ht 63.78 in | Wt 156.4 lb

## 2017-05-10 DIAGNOSIS — R05 Cough: Secondary | ICD-10-CM | POA: Diagnosis not present

## 2017-05-10 DIAGNOSIS — J111 Influenza due to unidentified influenza virus with other respiratory manifestations: Secondary | ICD-10-CM | POA: Diagnosis not present

## 2017-05-10 DIAGNOSIS — R059 Cough, unspecified: Secondary | ICD-10-CM

## 2017-05-10 MED ORDER — PREDNISONE 10 MG PO TABS: ORAL_TABLET | ORAL | 0 refills | Status: AC

## 2017-05-10 MED ORDER — ALBUTEROL SULFATE HFA 108 (90 BASE) MCG/ACT IN AERS
2.0000 | INHALATION_SPRAY | Freq: Four times a day (QID) | RESPIRATORY_TRACT | 1 refills | Status: DC | PRN
Start: 2017-05-10 — End: 2018-09-03

## 2017-05-10 MED ORDER — HYDROCODONE-HOMATROPINE 5-1.5 MG/5ML PO SYRP
5.0000 mL | ORAL_SOLUTION | Freq: Three times a day (TID) | ORAL | 0 refills | Status: DC | PRN
Start: 2017-05-10 — End: 2017-07-19

## 2017-05-10 NOTE — Patient Instructions (Addendum)
   IF you received an x-ray today, you will receive an invoice from [facility]. Please contact [facility] at [phone] with questions or concerns regarding your invoice.   IF you received labwork today, you will receive an invoice from [facility]. Please contact [facility] at [phone] with questions or concerns regarding your invoice.   Our billing staff will not be able to assist you with questions regarding bills from these companies.  You will be contacted with the lab results as soon as they are available. The fastest way to get your results is to activate your My Chart account. Instructions are located on the last page of this paperwork. If you have not heard from us regarding the results in 2 weeks, please contact this office.      Bronchospasm, Adult Bronchospasm is a tightening of the airways going into the lungs. During an episode, it may be harder to breathe. You may cough, and you may make a whistling sound when you breathe (wheeze). This condition often affects people with asthma. What are the causes? This condition is caused by swelling and irritation in the airways. It can be triggered by:  An infection (common).  Seasonal allergies.  An allergic reaction.  Exercise.  Irritants. These include pollution, cigarette smoke, strong odors, aerosol sprays, and paint fumes.  Weather changes. Winds increase molds and pollens in the air. Cold air may cause swelling.  Stress and emotional upset.  What are the signs or symptoms? Symptoms of this condition include:  Wheezing. If the episode was triggered by an allergy, wheezing may start right away or hours later.  Nighttime coughing.  Frequent or severe coughing with a simple cold.  Chest tightness.  Shortness of breath.  Decreased ability to exercise.  How is this diagnosed? This condition is usually diagnosed with a review of your medical history and a physical exam. Tests, such as lung  function tests, are sometimes done to look for other conditions. The need for a chest X-ray depends on where the wheezing occurs and whether it is the first time you have wheezed. How is this treated? This condition may be treated with:  Inhaled medicines. These open up the airways and help you breathe. They can be taken with an inhaler or a nebulizer device.  Corticosteroid medicines. These may be given for severe bronchospasm, usually when it is associated with asthma.  Avoiding triggers, such as irritants, infection, or allergies.  Follow these instructions at home: Medicines  Take over-the-counter and prescription medicines only as told by your health care provider.  If you need to use an inhaler or nebulizer to take your medicine, ask your health care provider to explain how to use it correctly. If you were given a spacer, always use it with your inhaler. Lifestyle  Reduce the number of triggers in your home. To do this: ? Change your heating and air conditioning filter at least once a month. ? Limit your use of fireplaces and wood stoves. ? Do not smoke. Do not allow smoking in your home. ? Avoid using perfumes and fragrances. ? Get rid of pests, such as roaches and mice, and their droppings. ? Remove any mold from your home. ? Keep your house clean and dust free. Use unscented cleaning products. ? Replace carpet with wood, tile, or vinyl flooring. Carpet can trap dander and dust. ? Use allergy-proof pillows, mattress covers, and box spring covers. ? Wash bed sheets and blankets every week in hot water. Dry them in   a dryer. ? Use blankets that are made of polyester or cotton. ? Wash your hands often. ? Do not allow pets in your bedroom.  Avoid breathing in cold air when you exercise. General instructions  Have a plan for seeking medical care. Know when to call your health care provider and local emergency services, and where to get emergency care.  Stay up to date on your  immunizations.  When you have an episode of bronchospasm, stay calm. Try to relax and breathe more slowly.  If you have asthma, make sure you have an asthma action plan.  Keep all follow-up visits as told by your health care provider. This is important. Contact a health care provider if:  You have muscle aches.  You have chest pain.  The mucus that you cough up (sputum) changes from clear or white to yellow, green, gray, or bloody.  You have a fever.  Your sputum gets thicker. Get help right away if:  Your wheezing and coughing get worse, even after you take your prescribed medicines.  It gets even harder to breathe.  You develop severe chest pain. Summary  Bronchospasm is a tightening of the airways going into the lungs.  During an episode of bronchospasm, you may have a harder time breathing. You may cough and make a whistling sound when you breathe (wheeze).  Avoid exposure to triggers such as smoke, dust, mold, animal dander, and fragrances.  When you have an episode of bronchospasm, stay calm. Try to relax and breathe more slowly. This information is not intended to replace advice given to you by your health care provider. Make sure you discuss any questions you have with your health care provider. Document Released: 03/30/2003 Document Revised: 03/23/2016 Document Reviewed: 03/23/2016 Elsevier Interactive Patient Education  2017 Elsevier Inc.  

## 2017-05-10 NOTE — Progress Notes (Signed)
Chief Complaint  Patient presents with  . ? bronchitis    dx with flu on 04/30/17 by [place], on 04/23/17 cold symptoms, ? sinus infection.  C/o chest hurting, fatigued and bad headaches with pressure in head.    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 Allergy    uses Flonase daily as needed and takes Loratadine daily   . Anemia    only after birth of 2nd child  . Arthritis   . Asthma    uses Albuterol daily as needed;exercise induced  . Cancer ([place]) 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, right cheek  . Depression    takes Celexa daily  . GERD (gastroesophageal reflux disease)    occ  . History of colon polyps   . History of shingles    5-6 yrs  . Hyperlipidemia   . Hypothyroid   . Insomnia    takes Restoril nightly as needed  . Interstitial cystitis   . Osteoarthritis   . Sjogren's syndrome ([place])   . Spinal headache    after 1st C/S, o2sat low after shoulder surgery after went to room(51%),hard to urinate)    Current Outpatient Medications  Medication Sig Dispense Refill  . albuterol (PROVENTIL HFA;VENTOLIN HFA) 108 (90 BASE) MCG/ACT inhaler Inhale 2 puffs into the lungs every 6 (six) hours as needed for shortness of breath.     . ASPERCREME LIDOCAINE EX Apply topically. For elbow pain    . benzonatate (TESSALON) 200 MG capsule Take 1 capsule (200 mg total) by mouth 2 (two) times daily as needed for cough. 20 capsule 0  . cholecalciferol (VITAMIN D) 1000 UNITS tablet Take 1,000 Units by mouth daily.    . citalopram (CELEXA) 40 MG tablet Take 1 tablet (40 mg total) by mouth daily. 90 tablet 3  . docusate sodium (COLACE) 100 MG capsule Take 400 mg by mouth at bedtime.     . fluticasone  (FLONASE) 50 MCG/ACT nasal spray 1-2 sprays each nostril once a day as needed 16 g 5  . HYDROcodone-homatropine (HYCODAN) 5-1.5 MG/5ML syrup Take 5 mLs by mouth every 8 (eight) hours as needed for cough. 120 mL 0  . Ketotifen Fumarate (ALAWAY OP) Place 1 drop into both eyes daily as needed (for allergies).    Marland Kitchen levothyroxine (SYNTHROID, LEVOTHROID) 75 MCG tablet Take 1 tablet (75 mcg total) by mouth daily. 90 tablet 3  . loratadine (CLARITIN) 10 MG tablet Take 10 mg by mouth daily as needed for allergies.     . predniSONE (DELTASONE) 10 MG tablet Take 6 tablets (60 mg total) by mouth daily with breakfast for 1 day, THEN 5 tablets (50 mg total) daily with breakfast for 1 day, THEN 4 tablets (40 mg total) daily with breakfast for 1 day, THEN 3 tablets (30 mg total) daily with breakfast for 1 day, THEN 2 tablets (20 mg total) daily with breakfast for 1 day, THEN 1 tablet (10 mg total) daily with breakfast for 1 day, THEN 1 tablet (10 mg total) daily with breakfast for 1 day. 22 tablet 0  . temazepam (RESTORIL) 30 MG capsule TAKE ONE CAPSULE BY MOUTH AT BEDTIME AS NEEDED FOR SLEEP  30 capsule 1   No current facility-administered medications for this visit.     Allergies:  Allergies  Allergen Reactions  . Diflucan [Fluconazole] Hives    unknown  . Thimerosal Hives  . Mercury Detox [Nutritional Supplements] Hives  . Influenza Vaccine Recombinant Rash    Mercury component in flu vaccine  . Levaquin [Levofloxacin] Rash  . Sulfa Antibiotics Rash    Past Surgical History:  Procedure Laterality Date  . CARPAL TUNNEL RELEASE Right 05/2007  . CESAREAN SECTION  1981/1984   x 2  . COLONOSCOPY    . TONSILLECTOMY AND ADENOIDECTOMY  age 46  . TOTAL SHOULDER ARTHROPLASTY Right 07/04/2013   DR NORRIS  . TOTAL SHOULDER ARTHROPLASTY Right 07/04/2013   Procedure: RIGHT TOTAL SHOULDER ARTHROPLASTY;  Surgeon: Augustin Schooling, MD;  Location: [facility] [place];  Service: Orthopedics;  Laterality: Right;  . TOTAL SHOULDER  ARTHROPLASTY Left 03/12/2015   Procedure: LEFT SHOULDER TOTAL SHOULDER ARTHROPLASTY;  Surgeon: Netta Cedars, MD;  Location: [place];  Service: Orthopedics;  Laterality: Left;    Social History   Socioeconomic History  . Marital status: Married    Spouse name: None  . Number of children: 2  . Years of education: None  . Highest education level: None  Social Needs  . Financial resource strain: None  . Food insecurity - worry: None  . Food insecurity - inability: None  . Transportation needs - medical: None  . Transportation needs - non-medical: None  Occupational History  . None  Tobacco Use  . Smoking status: Never Smoker  . Smokeless tobacco: Never Used  Substance and Sexual Activity  . Alcohol use: Yes    Alcohol/week: 0.0 oz    Comment: occ wine  . Drug use: No  . Sexual activity: Not Currently    Partners: Male    Birth control/protection: Post-menopausal  Other Topics Concern  . 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 Breast cancer Sister 11       stage 4, chemo and radiation  . Esophageal cancer Sister   . Diabetes Brother   . Epilepsy Daughter   . Hypertension Mother   . Osteoporosis Mother   . Diabetes Father   . Hypertension Father   . Lung cancer Paternal Aunt      ROS Review of Systems See HPI Constitution: No fevers or chills No malaise No diaphoresis Skin: No rash or itching Eyes: no blurry vision, no double vision GU: no dysuria or hematuria Neuro: no dizziness or headaches all others reviewed and negative   Objective: Vitals:   05/10/17 1552  BP: 116/80  Pulse: 94  Resp: 16  Temp: 98 F (36.7 C)  TempSrc: Oral  SpO2: 96%  Weight: 156 lb 6.4 oz (70.9 kg)  Height: 5' 3.78" (1.62 m)    Physical Exam  General: alert,  oriented, in NAD Head: normocephalic, atraumatic, no sinus tenderness Eyes: EOM intact, no scleral icterus or conjunctival injection Ears: TM clear bilaterally Nose: mucosa nonerythematous, nonedematous Throat: no pharyngeal exudate or erythema Lymph: no posterior auricular, submental or cervical lymph adenopathy Heart: normal rate, normal sinus rhythm, no murmurs Lungs: clear to auscultation bilaterally, no wheezing    Component  Latest Ref Rng & Units 04/30/2017  Influenza A, POC     Negative Positive (A)  Influenza B, POC     Negative Negative    Assessment and Plan Enrique was seen today for ? bronchitis.  Diagnoses and all orders for this visit:  Cough  Bronchitis with flu  Other orders -     predniSONE (DELTASONE) 10 MG tablet; Take 6 tablets (60 mg total) by mouth daily with breakfast for 1 day, THEN 5 tablets (50 mg total) daily with breakfast for 1 day, THEN 4 tablets (40 mg total) daily with breakfast for 1 day, THEN 3 tablets (30 mg total) daily with breakfast for 1 day, THEN 2 tablets (20 mg total) daily with breakfast for 1 day, THEN 1 tablet (10 mg total) daily with breakfast for 1 day, THEN 1 tablet (10 mg total) daily with breakfast for 1 day. -     HYDROcodone-homatropine (HYCODAN) 5-1.5 MG/5ML syrup; Take 5 mLs by mouth every 8 (eight) hours as needed for cough.   Discussed albuterol use for cough every 4 hours Discussed hycodan for cough suppression at night and mucinex in the day time Will start prednisone   [place]

## 2017-05-20 ENCOUNTER — Other Ambulatory Visit: Payer: Self-pay | Admitting: Obstetrics & Gynecology

## 2017-05-21 NOTE — Telephone Encounter (Signed)
Medication refill request: temazepam  Last AEX:  04-17-16  Next AEX: 07-19-17  Last MMG (if hormonal medication request): 01-24-16 WNL  Refill authorized: please advise

## 2017-05-30 ENCOUNTER — Telehealth: Payer: Self-pay | Admitting: Obstetrics & Gynecology

## 2017-05-30 NOTE — Telephone Encounter (Signed)
Pt called concerning current medication dosage

## 2017-05-31 NOTE — Telephone Encounter (Signed)
Spoke with patient. Patient states that she is currently taking Citalopram 40 mg daily. Reports she has been going through some tough times. Is seeing a Social worker. Is having increased depression. "I feel like with the weather and just everything going in my life I feel more depressed." Denies any feeling of SI/HI. Would like to know if she can increase her dosage of Citalopram at this time. Advised will review with Dr.Miller and return call.

## 2017-06-01 MED ORDER — BUPROPION HCL ER (XL) 150 MG PO TB24: 150.0000 mg | ORAL_TABLET | Freq: Every day | ORAL | 0 refills | Status: DC

## 2017-06-01 NOTE — Telephone Encounter (Signed)
Left message to call Christine Valdez at [phone]. 

## 2017-06-01 NOTE — Telephone Encounter (Signed)
Patient is calling to check on status of call. Advised will review with Dr.Miller and return call as soon as possible.

## 2017-06-01 NOTE — Telephone Encounter (Signed)
Patient returned call to Kaitlyn. °

## 2017-06-01 NOTE — Telephone Encounter (Signed)
Citalopram cannot be increased further.  40mg  is max dosage.  Ok to add wellbutrin 150mg  XL daily.  She needs to be seen in two weeks after starting.  Is she seeing a therapist at this time?  We may need to get her established with a psychiatrist as well.  Has she ever seen anyone in the past.  Ok to send in rx for 150mg  XL Wellbutrin daily.  Ok to take with Citalopram.

## 2017-06-01 NOTE — Telephone Encounter (Signed)
Spoke with patient. Advised of message as seen below from [place]. Patient verbalizes understanding. Rx for Wellbutrin XL 150 mg po daily #30 0RF sent to pharmacy on file. 2 week recheck scheduled for 06/18/2017 at 2:30 pm with Dr.Miller. Patient is agreeable to date and time. Is currently seeing a therapist. Has not seen psychiatry, but is willing to discuss this at her 2 week recheck appointment with [place].  Routing to provider for final review. Patient agreeable to disposition. Will close encounter.

## 2017-06-09 ENCOUNTER — Other Ambulatory Visit: Payer: Self-pay | Admitting: Obstetrics & Gynecology

## 2017-06-12 ENCOUNTER — Telehealth: Payer: Self-pay | Admitting: Obstetrics & Gynecology

## 2017-06-12 NOTE — Telephone Encounter (Signed)
Spoke with patient. Patient states that she stopped taking Wellbutrin XL 150 daily as she did not like the way it was making her feel and she was unable to sleep while taking it. Is still taking her Celexa 40 mg daily which she reports she has been taking for an extended period of time. Would like to continue seeing her therapist and work on being outside more and more active to help with symptoms. Reports she is feeling well and does not have any concerns. Has an aex in 07/2017.  Routing to provider for final review. Patient agreeable to disposition. Will close encounter.

## 2017-06-12 NOTE — Telephone Encounter (Signed)
Patient has stopped taking anxiety medication. Cancelled 06/18/17 appointment.

## 2017-06-13 ENCOUNTER — Other Ambulatory Visit: Payer: Self-pay | Admitting: Obstetrics & Gynecology

## 2017-06-14 ENCOUNTER — Other Ambulatory Visit: Payer: Self-pay | Admitting: Family Medicine

## 2017-06-14 NOTE — Telephone Encounter (Signed)
Received refill request on Citalopram 40mg  #90 from [facility] and Rx for #90 w/3Refills sent 09-28-16 to [facility]. Called patient to clarify if she is changing pharmcies and left message for her to return my call.

## 2017-06-14 NOTE — Telephone Encounter (Signed)
Spoke with patient and she states she was getting this medication from Dr.Hannah Maudie [place] and has changed PCP. She will call her new PCP for RX. Advised per RX in computer should not need refill until 09-28-17. Patient states she does not go to [facility] anymore. [facility] [address]. Advised if any problems call us back. She thanked me for calling.

## 2017-06-14 NOTE — Telephone Encounter (Signed)
Copied from [place]. Topic: Quick Communication - Rx Refill/Question >> Jun 14, 2017 11:44 AM Yvette Rack wrote: Medication: citalopram (CELEXA) 40 MG tablet  Dr Rodena Goldmann use to write this for patient   Has the patient contacted their pharmacy? Yes.     (Agent: If no, request that the patient contact the pharmacy for the refill.)   Preferred Pharmacy (with phone number or street name): [id number] [address], [place] [phone] (Phone) [phone] (Fax)     Agent: Please be advised that RX refills may take up to 3 business days. We ask that you follow-up with your pharmacy.

## 2017-06-14 NOTE — Telephone Encounter (Signed)
Celexa refill.Celexa previously filled by Dr. Colin Benton.  LOV: 05/10/17 with Dr. Nolon Rod, medication not addressed during visit  Dr. Nolon Rod  [facility]

## 2017-06-18 ENCOUNTER — Other Ambulatory Visit: Payer: Self-pay | Admitting: Obstetrics & Gynecology

## 2017-06-18 ENCOUNTER — Ambulatory Visit: Payer: BLUE CROSS/BLUE SHIELD | Admitting: Obstetrics & Gynecology

## 2017-06-18 DIAGNOSIS — Z1231 Encounter for screening mammogram for malignant neoplasm of breast: Secondary | ICD-10-CM

## 2017-07-10 ENCOUNTER — Ambulatory Visit
Admission: RE | Admit: 2017-07-10 | Discharge: 2017-07-10 | Disposition: A | Discharge status: Home / Self Care | Payer: BLUE CROSS/BLUE SHIELD | Source: Ambulatory Visit | Attending: Obstetrics & Gynecology | Admitting: Obstetrics & Gynecology

## 2017-07-10 DIAGNOSIS — Z1231 Encounter for screening mammogram for malignant neoplasm of breast: Secondary | ICD-10-CM

## 2017-07-19 ENCOUNTER — Encounter: Payer: Self-pay | Admitting: Obstetrics & Gynecology

## 2017-07-19 ENCOUNTER — Ambulatory Visit: Payer: BLUE CROSS/BLUE SHIELD | Admitting: Obstetrics & Gynecology

## 2017-07-19 ENCOUNTER — Other Ambulatory Visit: Payer: Self-pay

## 2017-07-19 VITALS — BP 132/86 | HR 76 | Resp 16 | Ht 63.0 in | Wt 159.0 lb

## 2017-07-19 DIAGNOSIS — Z01419 Encounter for gynecological examination (general) (routine) without abnormal findings: Secondary | ICD-10-CM | POA: Diagnosis not present

## 2017-07-19 MED ORDER — CITALOPRAM HYDROBROMIDE 40 MG PO TABS: 40.0000 mg | ORAL_TABLET | Freq: Every day | ORAL | 3 refills | Status: DC

## 2017-07-19 MED ORDER — TEMAZEPAM 30 MG PO CAPS: 30.0000 mg | ORAL_CAPSULE | Freq: Every evening | ORAL | 1 refills | Status: DC | PRN

## 2017-07-19 NOTE — Patient Instructions (Addendum)
Please call and check about Shingrix (shingles vaccine) coverage.  If covered, call [facility] or [facility].    Dr. Pearson [address], [place] [address] Phone [phone]

## 2017-07-19 NOTE — Progress Notes (Signed)
67 y.o. [id number] MarriedCaucasianF here for annual exam.  Doing well.  Did have the flu in January.  Took about four weeks until she finally really felt.  Granddaughter is going really well.  She is six.    Denies vaginal bleeding.    Has been seeing Dr. Gevena Valdez.    PCP:  Dr. Nolon Valdez.    Patient's last menstrual period was 04/11/2003 (approximate).          Sexually active: No.  The current method of family planning is post menopausal status.    Exercising: Yes.    walking Smoker: yes  Health Maintenance: Pap:  04/17/16 Neg. HR HPV:neg   04/08/15 neg  History of abnormal Pap:  no MMG:  07/10/17 BIRADS1:Neg  Colonoscopy:  08/19/14 f/u 5 years  BMD:   2009 Normal  TDaP:  2015 Pneumonia vaccine(s):  2016 Shingrix:   No Hep C testing: 04/08/15 Neg  Screening Labs: PCP   reports that she has never smoked. She has never used smokeless tobacco. She reports that she drinks alcohol. She reports that she does not use drugs.  Past Medical History:  Diagnosis Date  . Allergy    uses Flonase daily as needed and takes Loratadine daily   . Anemia    only after birth of 2nd child  . Arthritis   . Asthma    uses Albuterol daily as needed;exercise induced  . Cancer ([place]) 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, right cheek  . Depression    takes Celexa daily  . GERD (gastroesophageal reflux disease)    occ  . History of colon polyps   . History of shingles    5-6 yrs  . Hyperlipidemia   . Hypothyroid   . Insomnia    takes Restoril nightly as needed  . Interstitial cystitis   . Osteoarthritis   . Sjogren's syndrome ([place])   . Spinal headache    after 1st C/S, o2sat low after shoulder surgery after went to room(51%),hard to urinate)    Past Surgical History:  Procedure Laterality Date  . CARPAL TUNNEL RELEASE Right 05/2007  . CESAREAN SECTION  1981/1984   x 2  . COLONOSCOPY    . TONSILLECTOMY AND ADENOIDECTOMY  age 56  . TOTAL SHOULDER ARTHROPLASTY Right 07/04/2013   DR Christine Valdez  . TOTAL SHOULDER  ARTHROPLASTY Right 07/04/2013   Procedure: RIGHT TOTAL SHOULDER ARTHROPLASTY;  Surgeon: Christine Schooling, MD;  Location: [facility];  Service: Orthopedics;  Laterality: Right;  . TOTAL SHOULDER ARTHROPLASTY Left 03/12/2015   Procedure: LEFT SHOULDER TOTAL SHOULDER ARTHROPLASTY;  Surgeon: Christine Cedars, MD;  Location: [facility];  Service: Orthopedics;  Laterality: Left;    Current Outpatient Medications  Medication Sig Dispense Refill  . albuterol (PROVENTIL HFA;VENTOLIN HFA) 108 (90 Base) MCG/ACT inhaler Inhale 2 puffs into the lungs every 6 (six) hours as needed for shortness of breath. 1 Inhaler 1  . ASPERCREME LIDOCAINE EX Apply topically. For elbow pain    . cholecalciferol (VITAMIN D) 1000 UNITS tablet Take 1,000 Units by mouth daily.    . citalopram (CELEXA) 40 MG tablet TAKE 1 TABLET (40 MG TOTAL) BY MOUTH DAILY. 90 tablet 3  . docusate sodium (COLACE) 100 MG capsule Take 400 mg by mouth at bedtime.     . fluticasone (FLONASE) 50 MCG/ACT nasal spray 1-2 sprays each nostril once a day as needed 16 g 5  . Ketotifen Fumarate (ALAWAY OP) Place 1 drop into both eyes daily as needed (for allergies).    Marland Kitchen  levothyroxine (SYNTHROID, LEVOTHROID) 75 MCG tablet Take 1 tablet (75 mcg total) by mouth daily. 90 tablet 3  . loratadine (CLARITIN) 10 MG tablet Take 10 mg by mouth daily as needed for allergies.     . Multiple Vitamin (MULTIVITAMIN) tablet Take by mouth daily.    . temazepam (RESTORIL) 30 MG capsule TAKE ONE CAPSULE BY MOUTH AT BEDTIME AS NEEDED FOR SLEEP 30 capsule 0   No current facility-administered medications for this visit.     Family History  Problem Relation Age of Onset  . Breast cancer Sister 55       stage 4, chemo and radiation  . Esophageal cancer Sister   . Diabetes Brother   . Epilepsy Daughter   . Hypertension Mother   . Osteoporosis Mother   . Diabetes Father   . Hypertension Father   . Lung cancer Paternal Aunt     Review of Systems  Musculoskeletal: Positive for  myalgias.  Skin:       Excess hair growth   Psychiatric/Behavioral: Positive for depression. The patient is nervous/anxious.   All other systems reviewed and are negative.   Exam:   BP 132/86 (BP Location: Right Arm, Patient Position: Sitting, Cuff Size: Normal)   Pulse 76   Resp 16   Ht 5\' 3"  (1.6 m)   Wt 159 lb (72.1 kg)   LMP 04/11/2003 (Approximate)   BMI 28.17 kg/m   Height:   Height: 5\' 3"  (160 cm)  Ht Readings from Last 3 Encounters:  07/19/17 5\' 3"  (1.6 m)  05/10/17 5' 3.78" (1.62 m)  04/30/17 5' 3.78" (1.62 m)    General appearance: alert, cooperative and appears stated age Head: Normocephalic, without obvious abnormality, atraumatic Neck: no adenopathy, supple, symmetrical, trachea midline and thyroid normal to inspection and palpation Lungs: clear to auscultation bilaterally Breasts: normal appearance, no masses or tenderness Heart: regular rate and rhythm Abdomen: soft, non-tender; bowel sounds normal; no masses,  no organomegaly Extremities: extremities normal, atraumatic, no cyanosis or edema Skin: Skin color, texture, turgor normal. No rashes or lesions Lymph nodes: Cervical, supraclavicular, and axillary nodes normal. No abnormal inguinal nodes palpated Neurologic: Grossly normal   Pelvic: External genitalia:  no lesions              Urethra:  normal appearing urethra with no masses, tenderness or lesions              Bartholins and Skenes: normal                 Vagina: normal appearing vagina with normal color and discharge, no lesions              Cervix: no lesions              Pap taken: No. Bimanual Exam:  Uterus:  normal size, contour, position, consistency, mobility, non-tender              Adnexa: no mass, fullness, tenderness               Rectovaginal: Confirms               Anus:  normal sphincter tone, no lesions  Chaperone was present for exam.  A:  Well Woman with normal exam Hypothyroidism PMP, no HRT H/O Sjogren's syndrome H/O  depression Family hx of breast cancer in sister H/O shingles x 2 H/o elevated cholesterol  P:   Mammogram guidelines reviewed.  Doing 3D due to breast density pap smear  not obtained today.  Not indicated. RF for 40mg  daily.  #90/4RF. Restoril 30mg  qhs prn.  Uses rarely.  #30/1RF Will have repeat lab work this summer Pt desires names for psychiatry Return annually or prn

## 2017-07-20 ENCOUNTER — Other Ambulatory Visit: Payer: Self-pay | Admitting: Obstetrics & Gynecology

## 2017-07-20 NOTE — Telephone Encounter (Signed)
Dr. Sabra Heck recommended patient receive shingles vaccination. Christine Valdez outpatient pharmacy told patient it would be better for the insurance company if she has her write a prescription for it.

## 2017-07-20 NOTE — Telephone Encounter (Signed)
Spoke with patient, requesting RX to [facility] for Shingrix vaccine.   Advised patient will review with Dr. Sabra Heck and return call once reviewed. Patient agreeable.   Order pended. Shingrix vaccine now and repeat in 2-6 months. #1/1RF. Please fax documentation to office.   Dr. Sabra Heck -ok to proceed with Shingrix RX?

## 2017-07-23 MED ORDER — ZOSTER VAC RECOMB ADJUVANTED 50 MCG/0.5ML IM SUSR: INTRAMUSCULAR | 1 refills | Status: DC

## 2017-07-23 MED FILL — SHINGRIX 50 MCG SUS: 50 | 1 days supply | Qty: 1 | Fill #0

## 2017-07-23 NOTE — Telephone Encounter (Signed)
Yes.  Ok to proceed with Shingrix vaccine.

## 2017-07-23 NOTE — Telephone Encounter (Signed)
Left detailed message, ok per dpr. Advised Rx for Shingrix vaccine to [facility] as requested, f/u with pharmacy. Return call to office with any additional questions.

## 2017-08-01 ENCOUNTER — Ambulatory Visit: Payer: BLUE CROSS/BLUE SHIELD | Admitting: Family Medicine

## 2017-09-04 ENCOUNTER — Encounter: Payer: Self-pay | Admitting: Family Medicine

## 2017-09-21 MED FILL — SHINGRIX 50 MCG SUS: 50 | 1 days supply | Qty: 1 | Fill #1

## 2017-11-04 ENCOUNTER — Other Ambulatory Visit: Payer: Self-pay | Admitting: Family Medicine

## 2017-11-04 DIAGNOSIS — E039 Hypothyroidism, unspecified: Secondary | ICD-10-CM

## 2017-11-09 ENCOUNTER — Telehealth: Payer: Self-pay | Admitting: Family Medicine

## 2017-11-09 NOTE — Telephone Encounter (Signed)
Copied from [facility] [phone]. Topic: Quick Communication - Rx Refill/Question >> Nov 09, 2017  2:42 PM Scherrie Gerlach wrote: Medication: levothyroxine (SYNTHROID, LEVOTHROID) 75 MCG tablet  Pt sees Dr Nolon Rod now and dr Nolon Rod  has never filled this Rx for her.  ( this Rx went to previous dr, now pt is out)  [id number] [address], [place] (Phone) [phone] (Fax)

## 2017-11-20 ENCOUNTER — Other Ambulatory Visit: Payer: Self-pay | Admitting: *Deleted

## 2017-11-20 DIAGNOSIS — E039 Hypothyroidism, unspecified: Secondary | ICD-10-CM

## 2017-11-20 MED ORDER — LEVOTHYROXINE SODIUM 75 MCG PO TABS: 75.0000 ug | ORAL_TABLET | Freq: Every day | ORAL | 0 refills | Status: DC

## 2017-12-17 ENCOUNTER — Other Ambulatory Visit: Payer: Self-pay | Admitting: Family Medicine

## 2017-12-17 DIAGNOSIS — E039 Hypothyroidism, unspecified: Secondary | ICD-10-CM

## 2017-12-17 NOTE — Telephone Encounter (Signed)
Levothyroxine 75 mcg refill Last Refill:11/20/17 # 30 Last OV: 05/10/17 PCP: [id number]  Last TSH  09/28/16

## 2017-12-27 ENCOUNTER — Encounter: Payer: Self-pay | Admitting: Family Medicine

## 2017-12-27 ENCOUNTER — Other Ambulatory Visit: Payer: Self-pay

## 2017-12-27 ENCOUNTER — Ambulatory Visit (INDEPENDENT_AMBULATORY_CARE_PROVIDER_SITE_OTHER): Payer: Medicare HMO | Admitting: Family Medicine

## 2017-12-27 VITALS — BP 130/88 | HR 77 | Temp 99.5°F | Resp 17 | Ht 63.0 in | Wt 158.6 lb

## 2017-12-27 DIAGNOSIS — J029 Acute pharyngitis, unspecified: Secondary | ICD-10-CM

## 2017-12-27 DIAGNOSIS — J069 Acute upper respiratory infection, unspecified: Secondary | ICD-10-CM

## 2017-12-27 LAB — POCT RAPID STREP A (OFFICE): Rapid Strep A Screen: NEGATIVE

## 2017-12-27 NOTE — Progress Notes (Signed)
Chief Complaint  Patient presents with  . Sore Throat    x 2 weeks, saline spray, guafenisen.  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 Headache    x 2 weeks  . 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 Allergy    uses Flonase daily as needed and takes Loratadine daily   . Anemia    only after birth of 2nd child  . Arthritis   . Asthma    uses Albuterol daily as needed;exercise induced  . Cancer ([place]) 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, right cheek  . Depression    takes Celexa daily  . GERD (gastroesophageal reflux disease)    occ  . History of colon polyps   . History of shingles    5-6 yrs  . Hyperlipidemia   . Hypothyroid   . Insomnia    takes Restoril nightly as needed  . Interstitial cystitis   . Osteoarthritis   . Sjogren's syndrome ([place])   . Spinal headache    after 1st C/S, o2sat low after shoulder surgery after went to room(51%),hard to urinate)    Current Outpatient Medications  Medication Sig Dispense Refill  . albuterol (PROVENTIL HFA;VENTOLIN HFA) 108 (90 Base) MCG/ACT inhaler Inhale 2 puffs into the lungs every 6 (six) hours as needed for shortness of breath. 1 Inhaler 1  . ASPERCREME LIDOCAINE EX Apply topically. For elbow pain    . cholecalciferol (VITAMIN D) 1000 UNITS tablet Take 1,000 Units by mouth daily.    . citalopram (CELEXA) 40 MG tablet Take 1 tablet (40 mg total) by mouth daily. 90 tablet 3  . docusate sodium (COLACE) 100 MG capsule Take 400 mg by mouth at bedtime.     . fluticasone (FLONASE) 50 MCG/ACT nasal spray 1-2 sprays each nostril once a day as needed 16 g 5  . Ketotifen Fumarate (ALAWAY OP) Place 1 drop into both eyes daily as needed (for allergies).    Marland Kitchen levothyroxine (SYNTHROID, LEVOTHROID) 75  MCG tablet TAKE 1 TABLET BY MOUTH EVERY DAY 30 tablet 0  . loratadine (CLARITIN) 10 MG tablet Take 10 mg by mouth daily as needed for allergies.     . Multiple Vitamin (MULTIVITAMIN) tablet Take by mouth daily.    . temazepam (RESTORIL) 30 MG capsule Take 1 capsule (30 mg total) by mouth at bedtime as needed. 30 capsule 1  . Zoster Vaccine Adjuvanted [facility]) injection Shingrix vaccine now and repeat in 2-6 months. 0.5 mL 1   No current facility-administered medications for this visit.     Allergies:  Allergies  Allergen Reactions  . Diflucan [Fluconazole] Hives    unknown  . Thimerosal Hives  . Mercury Detox [Nutritional Supplements] Hives  . Influenza Vaccine Recombinant Rash    Mercury component in flu vaccine  . Levaquin [Levofloxacin] Rash  . Sulfa Antibiotics Rash    Past Surgical History:  Procedure Laterality Date  . CARPAL TUNNEL RELEASE Right 05/2007  . CESAREAN SECTION  1981/1984   x 2  . COLONOSCOPY    . TONSILLECTOMY AND ADENOIDECTOMY  age 24  . TOTAL SHOULDER ARTHROPLASTY Right 07/04/2013   DR NORRIS  . TOTAL SHOULDER ARTHROPLASTY  Right 07/04/2013   Procedure: RIGHT TOTAL SHOULDER ARTHROPLASTY;  Surgeon: Augustin Schooling, MD;  Location: [facility];  Service: Orthopedics;  Laterality: Right;  . TOTAL SHOULDER ARTHROPLASTY Left 03/12/2015   Procedure: LEFT SHOULDER TOTAL SHOULDER ARTHROPLASTY;  Surgeon: Netta Cedars, MD;  Location: [facility];  Service: Orthopedics;  Laterality: Left;    Social History   Socioeconomic History  . Marital status: Married    Spouse name: Not on file  . Number of children: 2  . Years of education: Not on file  . Highest education level: Not on file  Occupational History  . Not on file  Social Needs  . Financial resource strain: Not on file  . Food insecurity:    Worry: Not on file    Inability: Not on file  . Transportation needs:    Medical: Not on file    Non-medical: Not on file  Tobacco Use  . Smoking status: Never Smoker  .  Smokeless tobacco: Never Used  Substance and Sexual Activity  . Alcohol use: Yes    Alcohol/week: 0.0 standard drinks    Comment: occ wine  . Drug use: No  . Sexual activity: Not Currently    Partners: Male    Birth control/protection: Post-menopausal  Lifestyle  . Physical activity:    Days per week: Not on file    Minutes per session: Not on file  . Stress: Not on file  Relationships  . Social connections:    Talks on phone: Not on file    Gets together: Not on file    Attends religious service: Not on file    Active member of club or organization: Not on file    Attends meetings of clubs or organizations: Not on file    Relationship status: Not on file  Other Topics Concern  . Not on file  Social History Narrative   Work or School: nanny for multiple families      Home Situation: son a Theme park manager      Spiritual Beliefs: Christian      Lifestyle: no regular exercise, diet not great    Family History  Problem Relation Age of Onset  . Breast cancer Sister 31       stage 4, chemo and radiation  . Esophageal cancer Sister   . Diabetes Brother   . Epilepsy Daughter   . Hypertension Mother   . Osteoporosis Mother   . Diabetes Father   . Hypertension Father   . Lung cancer Paternal Aunt      ROS Review of Systems See HPI Constitution: No fevers or chills No malaise No diaphoresis Skin: No rash or itching Eyes: no blurry vision, no double vision GU: no dysuria or hematuria Neuro: no dizziness, + headaches all others reviewed and negative   Objective: Vitals:   12/27/17 1331  BP: 130/88  Pulse: 77  Resp: 17  Temp: 99.5 F (37.5 C)  TempSrc: Oral  SpO2: 96%  Weight: 158 lb 9.6 oz (71.9 kg)  Height: 5\' 3"  (1.6 m)    Physical Exam General: alert, oriented, in NAD Head: normocephalic, atraumatic, no sinus tenderness Eyes: EOM intact, no scleral icterus or conjunctival injection Ears: TM clear bilaterally Nose: mucosa nonerythematous,  nonedematous Throat: no pharyngeal exudate or erythema Lymph: no posterior auricular, submental or cervical lymph adenopathy Heart: normal rate, normal sinus rhythm, no murmurs Lungs: clear to auscultation bilaterally, no wheezing    Rapid strep negative  Assessment and Plan Carolee was seen today for  sore throat, headache and drainage green mucus back of throat.  Diagnoses and all orders for this visit:  Sore throat -     POCT rapid strep A  Acute URI  Discussed viral etiology Discussed otc decongestant Advised flonase for congestion  Increase hydration Vitamin C and zinc lozenges suggested Return to clinic if symptoms worse    [place]

## 2017-12-27 NOTE — Telephone Encounter (Signed)
Refill request for levothyroxine 75 mcg #30 no refills approved.  Note to pharmacy no more refills without appt.  Will send to scheduling pool to call pt and schedule f/u appt/labs with [facility]. Dgaddy, CMA

## 2017-12-27 NOTE — Patient Instructions (Addendum)
If you have lab work done today you will be contacted with your lab results within the next 2 weeks.  If you have not heard from [place] then please contact us. The fastest way to get your results is to register for My Chart.   IF you received an x-ray today, you will receive an invoice from [facility]. Please contact [facility] at [phone] with questions or concerns regarding your invoice.   IF you received labwork today, you will receive an invoice from [facility]. Please contact [facility] at [phone] with questions or concerns regarding your invoice.   Our billing staff will not be able to assist you with questions regarding bills from these companies.  You will be contacted with the lab results as soon as they are available. The fastest way to get your results is to activate your My Chart account. Instructions are located on the last page of this paperwork. If you have not heard from [place] regarding the results in 2 weeks, please contact this office.     Pharyngitis Pharyngitis is redness, pain, and swelling (inflammation) of the throat (pharynx). It is a very common cause of sore throat. Pharyngitis can be caused by a bacteria, but it is usually caused by a virus. Most cases of pharyngitis get better on their own without treatment. What are the causes? This condition may be caused by:  Infection by viruses (viral). Viral pharyngitis spreads from person to person (is contagious) through coughing, sneezing, and sharing of personal items or utensils such as cups, forks, spoons, and toothbrushes.  Infection by bacteria (bacterial). Bacterial pharyngitis may be spread by touching the nose or face after coming in contact with the bacteria, or through more intimate contact, such as kissing.  Allergies. Allergies can cause buildup of mucus in the throat (post-nasal drip), leading to inflammation and irritation. Allergies can also cause blocked nasal passages, forcing  breathing through the mouth, which dries and irritates the throat.  What increases the risk? You are more likely to develop this condition if:  You are 60-68 years old.  You are exposed to crowded environments such as daycare, school, or dormitory living.  You live in a cold climate.  You have a weakened disease-fighting (immune) system.  What are the signs or symptoms? Symptoms of this condition vary by the cause (viral, bacterial, or allergies) and can include:  Sore throat.  Fatigue.  Low-grade fever.  Headache.  Joint pain and muscle aches.  Skin rashes.  Swollen glands in the throat (lymph nodes).  Plaque-like film on the throat or tonsils. This is often a symptom of bacterial pharyngitis.  Vomiting.  Stuffy nose (nasal congestion).  Cough.  Red, itchy eyes (conjunctivitis).  Loss of appetite.  How is this diagnosed? This condition is often diagnosed based on your medical history and a physical exam. Your health care provider will ask you questions about your illness and your symptoms. A swab of your throat may be done to check for bacteria (rapid strep test). Other lab tests may also be done, depending on the suspected cause, but these are rare. How is this treated? This condition usually gets better in 3-4 days without medicine. Bacterial pharyngitis may be treated with antibiotic medicines. Follow these instructions at home:  Take over-the-counter and prescription medicines only as told by your health care provider. ? If you were prescribed an antibiotic medicine, take it as told by your health care provider. Do not stop taking the antibiotic even if  you start to feel better. ? Do not give children aspirin because of the association with Reye syndrome.  Drink enough water and fluids to keep your urine clear or pale yellow.  Get a lot of rest.  Gargle with a salt-water mixture 3-4 times a day or as needed. To make a salt-water mixture, completely dissolve  -1 tsp of salt in 1 cup of warm water.  If your health care provider approves, you may use throat lozenges or sprays to soothe your throat. Contact a health care provider if:  You have large, tender lumps in your neck.  You have a rash.  You cough up green, yellow-brown, or bloody spit. Get help right away if:  Your neck becomes stiff.  You drool or are unable to swallow liquids.  You cannot drink or take medicines without vomiting.  You have severe pain that does not go away, even after you take medicine.  You have trouble breathing, and it is not caused by a stuffy nose.  You have new pain and swelling in your joints such as the knees, ankles, wrists, or elbows. Summary  Pharyngitis is redness, pain, and swelling (inflammation) of the throat (pharynx).  While pharyngitis can be caused by a bacteria, the most common causes are viral.  Most cases of pharyngitis get better on their own without treatment.  Bacterial pharyngitis is treated with antibiotic medicines. This information is not intended to replace advice given to you by your health care provider. Make sure you discuss any questions you have with your health care provider. Document Released: 03/27/2005 Document Revised: 05/02/2016 Document Reviewed: 05/02/2016 Elsevier Interactive Patient Education  [employer].

## 2017-12-28 ENCOUNTER — Telehealth: Payer: Self-pay | Admitting: Family Medicine

## 2017-12-28 NOTE — Telephone Encounter (Signed)
Copied from [place] [phone]. Topic: General - Other >> Dec 28, 2017 12:54 PM Yvette Rack wrote: Reason for CRM: pt calling stating that she would like a antibiotic for her sinus she has drainage sore throat cough green mucus chest congestion [id number] [address], [place] (Phone)

## 2017-12-31 NOTE — Telephone Encounter (Signed)
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

## 2018-01-01 NOTE — Telephone Encounter (Signed)
Pt came into office wanting response on messages submitted.  Is asking for ABX for sinus infection and is upset that message has not been returned.  Apologized, advised I would review chart and she will receive response.  After review of chart, provider advised pt to return if s/s worsen.  L/m for pt that based on note from prev OV, recommend she schedule f/u for further eval.  No appts today but can be seen tomorrow.  req c/b to schedule appt.

## 2018-01-11 ENCOUNTER — Other Ambulatory Visit: Payer: Self-pay | Admitting: Obstetrics & Gynecology

## 2018-01-11 NOTE — Telephone Encounter (Signed)
Patient called to request a refill on Temazepam. She said she has been having trouble sleeping and she is out of the medication. She'd like a refill today, if possible, since she is out. Patient is aware our office generally asks for 48 hours to process refill requests. Pharmacy on file confirmed.Marland Kitchen

## 2018-01-11 NOTE — Telephone Encounter (Signed)
Medication refill request: restoril Last AEX:  07/19/17 Next AEX: 10/03/18 Last MMG (if hormonal medication request): 07/10/17 bi rads 1 neg  Refill authorized: #30 with No refills

## 2018-01-15 NOTE — Telephone Encounter (Signed)
Called patient and informed her that her temazepam was called in on the 4th of October to her pharmacy on file.

## 2018-01-18 ENCOUNTER — Other Ambulatory Visit: Payer: Self-pay | Admitting: Family Medicine

## 2018-01-18 DIAGNOSIS — E039 Hypothyroidism, unspecified: Secondary | ICD-10-CM

## 2018-01-18 NOTE — Telephone Encounter (Signed)
Requested medication (s) are due for refill today:yes  Requested medication (s) are on the active medication list: yes  Last refill: 12/27/17  #30  0 refills  Future visit scheduled: no  Notes to clinic:   Needs appointment LVM to call office. Last TSH 09/28/16    Requested Prescriptions  Pending Prescriptions Disp Refills   levothyroxine (SYNTHROID, LEVOTHROID) 75 MCG tablet [Pharmacy Med Name: LEVOTHYROXINE 75 MCG TABLET] 30 tablet 0    Sig: TAKE 1 TABLET BY MOUTH EVERY DAY     Endocrinology:  Hypothyroid Agents Failed - 01/18/2018  1:45 PM      Failed - TSH needs to be rechecked within 3 months after an abnormal result. Refill until TSH is due.      Failed - TSH in normal range and within 360 days    TSH  Date Value Ref Range Status  09/28/2016 2.06 0.35 - 4.50 uIU/mL Final         Passed - Valid encounter within last 12 months    Recent Outpatient Visits          3 weeks ago Sore throat   Primary Care at [facility], Arlie Solomons, MD   8 months ago Cough   Primary Care at [facility], Arlie Solomons, MD   8 months ago Influenza A   [facility] at [facility], Lilia Argue, MD   9 months ago Prediabetes   Primary Care at [facility], [place] A, MD   11 months ago Acute bilateral back pain, unspecified back location   Primary Care at [facility], Arlie Solomons, MD

## 2018-01-18 NOTE — Telephone Encounter (Signed)
Call paced to patient. VM to call office for appointment.

## 2018-01-24 ENCOUNTER — Other Ambulatory Visit: Payer: Self-pay | Admitting: Family Medicine

## 2018-01-24 DIAGNOSIS — E039 Hypothyroidism, unspecified: Secondary | ICD-10-CM

## 2018-01-24 NOTE — Telephone Encounter (Signed)
Copied from [place] [phone]. Topic: Quick Communication - Rx Refill/Question >> Jan 24, 2018  3:26 PM Yvette Rack wrote: Medication: levothyroxine (SYNTHROID, LEVOTHROID) 75 MCG tablet  Has the patient contacted their pharmacy? yes   Preferred Pharmacy (with phone number or street name): [id number] [address], [place] [phone] (Phone) [phone] (Fax)  Agent: Please be advised that RX refills may take up to 3 business days. We ask that you follow-up with your pharmacy.

## 2018-01-24 NOTE — Telephone Encounter (Signed)
Requested medication (s) are due for refill today: yes  Requested medication (s) are on the active medication list: yes    Last refill: 12/27/17   Future visit scheduled yes   02/02/18  Notes to clinic:Last TSH June 2018  Requested Prescriptions  Pending Prescriptions Disp Refills   levothyroxine (SYNTHROID, LEVOTHROID) 75 MCG tablet [Pharmacy Med Name: LEVOTHYROXINE 75 MCG TABLET] 90 tablet 1    Sig: TAKE 1 TABLET BY [place]     Endocrinology:  Hypothyroid Agents Failed - 01/24/2018  3:13 PM      Failed - TSH needs to be rechecked within 3 months after an abnormal result. Refill until TSH is due.      Failed - TSH in normal range and within 360 days    TSH  Date Value Ref Range Status  09/28/2016 2.06 0.35 - 4.50 uIU/mL Final         Passed - Valid encounter within last 12 months    Recent Outpatient Visits          4 weeks ago Sore throat   Primary Care at [facility], Arlie Solomons, MD   8 months ago Cough   Primary Care at [facility], Arlie Solomons, MD   8 months ago Influenza A   Primary Care at [facility], Lilia Argue, MD   9 months ago Prediabetes   Primary Care at [facility], [place], MD   11 months ago Acute bilateral back pain, unspecified back location   Primary Care at [place], MD      Future Appointments            In 1 week Forrest Moron, MD Primary Care at [place], [facility]

## 2018-01-24 NOTE — Telephone Encounter (Signed)
Patient called and advised she will need an appointment before more refills Levothyroxine, patient asks if this will be a physical or just an appointment for refills, I advised her last physical was January, 2019 and this will be a visit to follow up, she verbalized understanding. No available appointments with PCP or any provider in the practice, patient placed on hold while I call the office to check on scheduling patient, while on hold the patient hung up the phone. Office called and spoke to [place], who received approval to schedule the patient on Saturday, 02/02/18 at 34 with Dr. Nolon Rod for med refills. Patient called back and advised of the appointment. Patient advised her last TSH was in June, 2018, so it's a possibility blood work will be done at the visit, she asks will she need to be fasting and if someone will let her know ahead of time, so she can prepare. Patient asks for enough medication until her appointment, she says she is out of the medication.

## 2018-01-24 NOTE — Telephone Encounter (Signed)
Requested medication (s) are due for refill today: Yes  Requested medication (s) are on the active medication list: Yes  Last refill:  12/27/17  Future visit scheduled: Yes  Notes to clinic: Unable to refill per protocol. Requesting enough refilled until appointment on 02/02/18, patient is out of medication.     Requested Prescriptions  Pending Prescriptions Disp Refills   levothyroxine (SYNTHROID, LEVOTHROID) 75 MCG tablet 30 tablet 0    Sig: Take 1 tablet (75 mcg total) by mouth daily.     Endocrinology:  Hypothyroid Agents Failed - 01/24/2018  4:31 PM      Failed - TSH needs to be rechecked within 3 months after an abnormal result. Refill until TSH is due.      Failed - TSH in normal range and within 360 days    TSH  Date Value Ref Range Status  09/28/2016 2.06 0.35 - 4.50 uIU/mL Final         Passed - Valid encounter within last 12 months    Recent Outpatient Visits          4 weeks ago Sore throat   Primary Care at [facility], Arlie Solomons, MD   8 months ago Cough   Primary Care at [facility], Arlie Solomons, MD   8 months ago Influenza A   Primary Care at [facility], Lilia Argue, MD   9 months ago Prediabetes   Primary Care at [facility], [place], MD   11 months ago Acute bilateral back pain, unspecified back location   Primary Care at [place], MD      Future Appointments            In 1 week Forrest Moron, MD Primary Care at [place], [facility]

## 2018-01-25 MED ORDER — LEVOTHYROXINE SODIUM 75 MCG PO TABS: 75.0000 ug | ORAL_TABLET | Freq: Every day | ORAL | 0 refills | Status: DC

## 2018-01-25 NOTE — Telephone Encounter (Signed)
Courtesy refill  

## 2018-01-25 NOTE — Addendum Note (Signed)
Addended by: Matilde Sprang on: 01/25/2018 10:33 AM   Modules accepted: Orders

## 2018-02-02 ENCOUNTER — Ambulatory Visit (INDEPENDENT_AMBULATORY_CARE_PROVIDER_SITE_OTHER): Payer: Medicare HMO | Admitting: Family Medicine

## 2018-02-02 ENCOUNTER — Encounter: Payer: Self-pay | Admitting: Family Medicine

## 2018-02-02 ENCOUNTER — Other Ambulatory Visit: Payer: Self-pay

## 2018-02-02 VITALS — BP 135/79 | HR 84 | Temp 98.4°F | Resp 16 | Ht 63.0 in | Wt 159.0 lb

## 2018-02-02 DIAGNOSIS — E039 Hypothyroidism, unspecified: Secondary | ICD-10-CM | POA: Diagnosis not present

## 2018-02-02 DIAGNOSIS — Z23 Encounter for immunization: Secondary | ICD-10-CM

## 2018-02-02 NOTE — Patient Instructions (Signed)
° ° ° °  If you have lab work done today you will be contacted with your lab results within the next 2 weeks.  If you have not heard from us then please contact us. The fastest way to get your results is to register for My Chart. ° ° °IF you received an x-ray today, you will receive an invoice from [facility]. Please contact [facility] at [phone] with questions or concerns regarding your invoice.  ° °IF you received labwork today, you will receive an invoice from [facility]. Please contact [facility] at [phone] with questions or concerns regarding your invoice.  ° °Our billing staff will not be able to assist you with questions regarding bills from these companies. ° °You will be contacted with the lab results as soon as they are available. The fastest way to get your results is to activate your My Chart account. Instructions are located on the last page of this paperwork. If you have not heard from us regarding the results in 2 weeks, please contact this office. °  ° ° ° °

## 2018-02-02 NOTE — Progress Notes (Signed)
Chief Complaint  Patient presents with  . Medication Refill    synthroid    HPI    Thyroid:  She reports that she is here to follow up on her thyroid She has been 100% compliant Patient presents for evaluation of hypothyroidism. Current symptoms include none. Patient denies denies fatigue, weight changes, heat/cold intolerance, bowel/skin changes or CVS symptoms.   Lab Results  Component Value Date   TSH 2.06 09/28/2016   Wt Readings from Last 3 Encounters:  02/02/18 159 lb (72.1 kg)  12/27/17 158 lb 9.6 oz (71.9 kg)  07/19/17 159 lb (72.1 kg)      Past Medical History:  Diagnosis Date  . Allergy    uses Flonase daily as needed and takes Loratadine daily   . Anemia    only after birth of 2nd child  . Arthritis   . Asthma    uses Albuterol daily as needed;exercise induced  . 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 Depression    takes Celexa daily  . GERD (gastroesophageal reflux disease)    occ  . History of colon polyps   . History of shingles    5-6 yrs  . Hyperlipidemia   . Hypothyroid   . Insomnia    takes Restoril nightly as needed  . Interstitial cystitis   . Osteoarthritis   . Sjogren's syndrome ([place])   . Spinal headache    after 1st C/S, o2sat low after shoulder surgery after went to room(51%),hard to urinate)    Current Outpatient Medications  Medication Sig Dispense Refill  . albuterol (PROVENTIL HFA;VENTOLIN HFA) 108 (90 Base) MCG/ACT inhaler Inhale 2 puffs into the lungs every 6 (six) hours as needed for shortness of breath. 1 Inhaler 1  . ASPERCREME LIDOCAINE EX Apply topically. For elbow pain    . cholecalciferol (VITAMIN D) 1000 UNITS tablet Take 1,000 Units by mouth daily.    . citalopram (CELEXA) 40 MG tablet Take 1 tablet (40 mg total) by mouth daily. 90 tablet 3  . docusate sodium (COLACE) 100 MG capsule Take 400 mg by mouth at bedtime.     . fluticasone (FLONASE) 50 MCG/ACT nasal spray 1-2 sprays each nostril once a day as  needed 16 g 5  . Ketotifen Fumarate (ALAWAY OP) Place 1 drop into both eyes daily as needed (for allergies).    Marland Kitchen levothyroxine (SYNTHROID, LEVOTHROID) 75 MCG tablet Take 1 tablet (75 mcg total) by mouth daily. 10 tablet 0  . loratadine (CLARITIN) 10 MG tablet Take 10 mg by mouth daily as needed for allergies.     . Multiple Vitamin (MULTIVITAMIN) tablet Take by mouth daily.    . temazepam (RESTORIL) 30 MG capsule TAKE 1 CAPSULE (30 MG TOTAL) BY MOUTH AT BEDTIME AS NEEDED. 30 capsule 1  . Zoster Vaccine Adjuvanted [facility]) injection Shingrix vaccine now and repeat in 2-6 months. 0.5 mL 1   No current facility-administered medications for this visit.     Allergies:  Allergies  Allergen Reactions  . Diflucan [Fluconazole] Hives    unknown  . Thimerosal Hives  . Mercury Detox [Nutritional Supplements] Hives  . Influenza Vaccine Recombinant Rash    Mercury component in flu vaccine  . Levaquin [Levofloxacin] Rash  . Sulfa Antibiotics Rash    Past Surgical History:  Procedure Laterality Date  . CARPAL TUNNEL RELEASE Right 05/2007  . CESAREAN SECTION  1981/1984   x 2  . COLONOSCOPY    . TONSILLECTOMY AND ADENOIDECTOMY  age 34  . TOTAL SHOULDER ARTHROPLASTY Right 07/04/2013   DR NORRIS  . TOTAL SHOULDER ARTHROPLASTY Right 07/04/2013   Procedure: RIGHT TOTAL SHOULDER ARTHROPLASTY;  Surgeon: Augustin Schooling, MD;  Location: [place];  Service: Orthopedics;  Laterality: Right;  . TOTAL SHOULDER ARTHROPLASTY Left 03/12/2015   Procedure: LEFT SHOULDER TOTAL SHOULDER ARTHROPLASTY;  Surgeon: Netta Cedars, MD;  Location: [place];  Service: Orthopedics;  Laterality: Left;    Social History   Socioeconomic History  . Marital status: Married    Spouse name: Not on file  . Number of children: 2  . Years of education: Not on file  . Highest education level: Not on file  Occupational History  . Not on file  Social Needs  . Financial resource strain: Not on file  . Food insecurity:    Worry: Not  on file    Inability: Not on file  . Transportation needs:    Medical: Not on file    Non-medical: Not on file  Tobacco Use  . Smoking status: Never Smoker  . Smokeless tobacco: Never Used  Substance and Sexual Activity  . Alcohol use: Yes    Alcohol/week: 0.0 standard drinks    Comment: occ wine  . Drug use: No  . Sexual activity: Not Currently    Partners: Male    Birth control/protection: Post-menopausal  Lifestyle  . Physical activity:    Days per week: Not on file    Minutes per session: Not on file  . Stress: Not on file  Relationships  . Social connections:    Talks on phone: Not on file    Gets together: Not on file    Attends religious service: Not on file    Active member of club or organization: Not on file    Attends meetings of clubs or organizations: Not on file    Relationship status: Not on file  Other Topics Concern  . Not on file  Social History Narrative   Work or School: nanny for multiple families      Home Situation: son a Theme park manager      Spiritual Beliefs: Christian      Lifestyle: no regular exercise, diet not great    Family History  Problem Relation Age of Onset  . Breast cancer Sister 79       stage 4, chemo and radiation  . Esophageal cancer Sister   . Diabetes Brother   . Epilepsy Daughter   . Hypertension Mother   . Osteoporosis Mother   . Diabetes Father   . Hypertension Father   . Lung cancer Paternal Aunt      ROS Review of Systems See HPI Constitution: No fevers or chills No malaise No diaphoresis Skin: No rash or itching Eyes: no blurry vision, no double vision GU: no dysuria or hematuria Neuro: no dizziness or headaches all others reviewed and negative   Objective: Vitals:   02/02/18 1128  BP: 135/79  Pulse: 84  Resp: 16  Temp: 98.4 F (36.9 C)  TempSrc: Oral  SpO2: 97%  Weight: 159 lb (72.1 kg)  Height: 5\' 3"  (1.6 m)    Physical Exam Physical Exam  Constitutional: She is oriented to person, place, and  time. She appears well-developed and well-nourished.  HENT:  Head: Normocephalic and atraumatic.  Eyes: Conjunctivae and EOM are normal.  Neck: no thyromegaly, neck supple Cardiovascular: Normal rate, regular rhythm and normal heart sounds.   Pulmonary/Chest: Effort normal and breath sounds normal. No respiratory  distress. She has no wheezes.  Neurological: She is alert and oriented to person, place, and time.    Assessment and Plan Christine Valdez was seen today for medication refill.  Diagnoses and all orders for this visit:  Acquired hypothyroidism- discussed that she should continue on her thyroid dose as long as her TSH is within range 1-5 -     TSH + free T4  Need for prophylactic vaccination against Streptococcus pneumoniae (pneumococcus) -     Pneumococcal conjugate vaccine 13-valent IM     Zoe A Stallings

## 2018-02-03 LAB — TSH+FREE T4
Free T4: 1.34 ng/dL (ref 0.82–1.77)
TSH: 0.996 u[IU]/mL (ref 0.450–4.500)

## 2018-02-06 DIAGNOSIS — R69 Illness, unspecified: Secondary | ICD-10-CM | POA: Diagnosis not present

## 2018-02-08 ENCOUNTER — Other Ambulatory Visit: Payer: Self-pay | Admitting: Family Medicine

## 2018-02-08 ENCOUNTER — Encounter: Payer: Self-pay | Admitting: Family Medicine

## 2018-02-08 DIAGNOSIS — E039 Hypothyroidism, unspecified: Secondary | ICD-10-CM

## 2018-02-09 ENCOUNTER — Other Ambulatory Visit: Payer: Self-pay | Admitting: Family Medicine

## 2018-02-09 DIAGNOSIS — E039 Hypothyroidism, unspecified: Secondary | ICD-10-CM

## 2018-02-09 MED ORDER — LEVOTHYROXINE SODIUM 75 MCG PO TABS: 75.0000 ug | ORAL_TABLET | Freq: Every day | ORAL | 3 refills | Status: DC

## 2018-02-11 NOTE — Telephone Encounter (Signed)
Last refilled on 02/09/18 for 90 tabs and 3 refills. Requested Prescriptions  Refused Prescriptions Disp Refills  . levothyroxine (SYNTHROID, LEVOTHROID) 75 MCG tablet [Pharmacy Med Name: LEVOTHYROXINE 75 MCG TABLET] 30 tablet 0    Sig: TAKE 1 TABLET BY MOUTH EVERY DAY     Endocrinology:  Hypothyroid Agents Failed - 02/09/2018 11:07 AM      Failed - TSH needs to be rechecked within 3 months after an abnormal result. Refill until TSH is due.      Passed - TSH in normal range and within 360 days    TSH  Date Value Ref Range Status  02/02/2018 0.996 0.450 - 4.500 uIU/mL Final         Passed - Valid encounter within last 12 months    Recent Outpatient Visits          1 week ago Acquired hypothyroidism   Primary Care at [facility], Arlie Solomons, MD   1 month ago Sore throat   Primary Care at [facility], Arlie Solomons, MD   9 months ago Cough   Primary Care at [facility], Arlie Solomons, MD   9 months ago Influenza A   Primary Care at [facility], Lilia Argue, MD   9 months ago Prediabetes   Primary Care at [facility], Arlie Solomons, MD

## 2018-02-23 DIAGNOSIS — J069 Acute upper respiratory infection, unspecified: Secondary | ICD-10-CM | POA: Diagnosis not present

## 2018-02-23 DIAGNOSIS — H8112 Benign paroxysmal vertigo, left ear: Secondary | ICD-10-CM | POA: Diagnosis not present

## 2018-02-25 ENCOUNTER — Ambulatory Visit: Payer: Medicare HMO | Admitting: Physician Assistant

## 2018-02-26 ENCOUNTER — Other Ambulatory Visit: Payer: Self-pay | Admitting: Family Medicine

## 2018-03-01 ENCOUNTER — Other Ambulatory Visit: Payer: Self-pay | Admitting: Family Medicine

## 2018-03-01 NOTE — Telephone Encounter (Signed)
Copied from [place] [phone]. Topic: Quick Communication - Rx Refill/Question >> Mar 01, 2018  3:53 PM [place], [place] D wrote: Medication: fluticasone (FLONASE) 50 MCG/ACT nasal spray / Not prescribed by Dr. Nolon Rod but pt would like know if Dr. Nolon Rod could refill or send in a new Rx for her. Cheaper than purchasing OTC  Has the patient contacted their pharmacy? Yes.   (Agent: If no, request that the patient contact the pharmacy for the refill.) (Agent: If yes, when and what did the pharmacy advise?)  Preferred Pharmacy (with phone number or street name): [id number] [address], [place] [phone] (Phone) [phone] (Fax)    Agent: Please be advised that RX refills may take up to 3 business days. We ask that you follow-up with your pharmacy.

## 2018-03-01 NOTE — Telephone Encounter (Signed)
Requested medication (s) are due for refill today: yes Flonase  Requested medication (s) are on the active medication list: yes  Last refill:  12/12/16   Future visit scheduled: no  Notes to clinic:  Synthroid responded to by other means    Requested Prescriptions  Pending Prescriptions Disp Refills   fluticasone (FLONASE) 50 MCG/ACT nasal spray 16 g 5    Sig: 1-2 sprays each nostril once a day as needed     Ear, Nose, and Throat: Nasal Preparations - Corticosteroids Passed - 03/01/2018  4:00 PM      Passed - Valid encounter within last 12 months    Recent Outpatient Visits          3 weeks ago Acquired hypothyroidism   Primary Care at [facility], Arlie Solomons, MD   2 months ago Sore throat   Primary Care at [facility], Arlie Solomons, MD   9 months ago Cough   Primary Care at [facility], Arlie Solomons, MD   10 months ago Influenza A   Primary Care at [facility], Lilia Argue, MD   10 months ago Prediabetes   Primary Care at [facility], Arlie Solomons, MD           Refused Prescriptions Disp Refills   levothyroxine (SYNTHROID, LEVOTHROID) 59 MCG tablet [Pharmacy Med Name: LEVOTHYROXINE 75 MCG TABLET] 30 tablet 0    Sig: TAKE 1 TABLET BY [place]     Endocrinology:  Hypothyroid Agents Failed - 03/01/2018  4:00 PM      Failed - TSH needs to be rechecked within 3 months after an abnormal result. Refill until TSH is due.      Passed - TSH in normal range and within 360 days    TSH  Date Value Ref Range Status  02/02/2018 0.996 0.450 - 4.500 uIU/mL Final         Passed - Valid encounter within last 12 months    Recent Outpatient Visits          3 weeks ago Acquired hypothyroidism   Primary Care at [facility], Arlie Solomons, MD   2 months ago Sore throat   Primary Care at [facility], Arlie Solomons, MD   9 months ago Cough   Primary Care at [facility], Arlie Solomons, MD   10 months ago Influenza A   Primary Care at [facility], Lilia Argue, MD   10 months ago Prediabetes   Primary Care at [facility], Arlie Solomons, MD

## 2018-03-04 MED ORDER — FLUTICASONE PROPIONATE 50 MCG/ACT NA SUSP: NASAL | 4 refills | Status: DC

## 2018-05-01 DIAGNOSIS — R69 Illness, unspecified: Secondary | ICD-10-CM | POA: Diagnosis not present

## 2018-05-04 ENCOUNTER — Encounter: Payer: Self-pay | Admitting: Family Medicine

## 2018-05-04 ENCOUNTER — Other Ambulatory Visit: Payer: Self-pay

## 2018-05-04 ENCOUNTER — Ambulatory Visit (INDEPENDENT_AMBULATORY_CARE_PROVIDER_SITE_OTHER): Payer: Medicare HMO | Admitting: Family Medicine

## 2018-05-04 VITALS — BP 147/74 | HR 94 | Temp 98.8°F | Ht 63.0 in | Wt 158.0 lb

## 2018-05-04 DIAGNOSIS — R05 Cough: Secondary | ICD-10-CM

## 2018-05-04 DIAGNOSIS — J111 Influenza due to unidentified influenza virus with other respiratory manifestations: Secondary | ICD-10-CM

## 2018-05-04 DIAGNOSIS — J45909 Unspecified asthma, uncomplicated: Secondary | ICD-10-CM | POA: Diagnosis not present

## 2018-05-04 DIAGNOSIS — R6889 Other general symptoms and signs: Secondary | ICD-10-CM

## 2018-05-04 DIAGNOSIS — Z09 Encounter for follow-up examination after completed treatment for conditions other than malignant neoplasm: Secondary | ICD-10-CM

## 2018-05-04 DIAGNOSIS — R059 Cough, unspecified: Secondary | ICD-10-CM

## 2018-05-04 LAB — POCT INFLUENZA A/B
Influenza A, POC: NEGATIVE
Influenza B, POC: POSITIVE — AB

## 2018-05-04 NOTE — Progress Notes (Signed)
Established Patient Office Visit  Subjective:  Patient ID: Christine Valdez, female    DOB: [birth year]  Age: 68 y.o. MRN: [id number]  CC:  Chief Complaint  Patient presents with  . flu like sx    fever, chills, bodyache,   . Nasal Congestion  . Fever    all since Thursday  . Chills  . Generalized Body Aches    HPI Christine Valdez is a 68 year old female who presents for Sick Visit.   Past Medical History:  Diagnosis Date  . Allergy    uses Flonase daily as needed and takes Loratadine daily   . Anemia    only after birth of 2nd child  . Arthritis   . Asthma    uses Albuterol daily as needed;exercise induced  . Cancer ([place]) 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, right cheek  . Depression    takes Celexa daily  . GERD (gastroesophageal reflux disease)    occ  . History of colon polyps   . History of shingles    5-6 yrs  . Hyperlipidemia   . Hypothyroid   . Insomnia    takes Restoril nightly as needed  . Interstitial cystitis   . Osteoarthritis   . Sjogren's syndrome ([place])   . Spinal headache    after 1st C/S, o2sat low after shoulder surgery after went to room(51%),hard to urinate)   Current Status: Since her last office visit, she has c/o body aches, fevers, cough, and fatigue. She has been symptomatic for the last 3 days. She has been very fatigued and experiencing chills. She states that she believes that she has contracted it from her grand-daughter, who has been home from school with the flu.   She denies recent infections, weight loss, and night sweats. She has not had any headaches, visual changes, dizziness, and falls. No chest pain, heart palpitations, cough and shortness of breath reported. No reports of GI problems such as nausea, vomiting, diarrhea, and constipation. She has no reports of blood in stools, dysuria and hematuria. No depression or anxiety reported. She denies pain today.   Past Surgical History:  Procedure Laterality Date  . CARPAL TUNNEL  RELEASE Right 05/2007  . CESAREAN SECTION  1981/1984   x 2  . COLONOSCOPY    . TONSILLECTOMY AND ADENOIDECTOMY  age 63  . TOTAL SHOULDER ARTHROPLASTY Right 07/04/2013   DR NORRIS  . TOTAL SHOULDER ARTHROPLASTY Right 07/04/2013   Procedure: RIGHT TOTAL SHOULDER ARTHROPLASTY;  Surgeon: Augustin Schooling, MD;  Location: [facility];  Service: Orthopedics;  Laterality: Right;  . TOTAL SHOULDER ARTHROPLASTY Left 03/12/2015   Procedure: LEFT SHOULDER TOTAL SHOULDER ARTHROPLASTY;  Surgeon: Netta Cedars, MD;  Location: [facility];  Service: Orthopedics;  Laterality: Left;    Family History  Problem Relation Age of Onset  . Breast cancer Sister 49       stage 4, chemo and radiation  . Esophageal cancer Sister   . Diabetes Brother   . Epilepsy Daughter   . Hypertension Mother   . Osteoporosis Mother   . Diabetes Father   . Hypertension Father   . Lung cancer Paternal Aunt     Social History   Socioeconomic History  . Marital status: Married    Spouse name: Not on file  . Number of children: 2  . Years of education: Not on file  . Highest education level: Not on file  Occupational History  . Not on file  Social Needs  .  Financial resource strain: Not on file  . Food insecurity:    Worry: Not on file    Inability: Not on file  . Transportation needs:    Medical: Not on file    Non-medical: Not on file  Tobacco Use  . Smoking status: Never Smoker  . Smokeless tobacco: Never Used  Substance and Sexual Activity  . Alcohol use: Yes    Alcohol/week: 0.0 standard drinks    Comment: occ wine  . Drug use: No  . Sexual activity: Not Currently    Partners: Male    Birth control/protection: Post-menopausal  Lifestyle  . Physical activity:    Days per week: Not on file    Minutes per session: Not on file  . Stress: Not on file  Relationships  . Social connections:    Talks on phone: Not on file    Gets together: Not on file    Attends religious service: Not on file    Active member of club  or organization: Not on file    Attends meetings of clubs or organizations: Not on file    Relationship status: Not on file  . Intimate partner violence:    Fear of current or ex partner: Not on file    Emotionally abused: Not on file    Physically abused: Not on file    Forced sexual activity: Not on file  Other Topics Concern  . Not on file  Social History Narrative   Work or School: nanny for multiple families      Home Situation: son a Theme park manager      Spiritual Beliefs: Christian      Lifestyle: no regular exercise, diet not great    Outpatient Medications Prior to Visit  Medication Sig Dispense Refill  . albuterol (PROVENTIL HFA;VENTOLIN HFA) 108 (90 Base) MCG/ACT inhaler Inhale 2 puffs into the lungs every 6 (six) hours as needed for shortness of breath. 1 Inhaler 1  . ASPERCREME LIDOCAINE EX Apply topically. For elbow pain    . cholecalciferol (VITAMIN D) 1000 UNITS tablet Take 1,000 Units by mouth daily.    . citalopram (CELEXA) 40 MG tablet Take 1 tablet (40 mg total) by mouth daily. 90 tablet 3  . docusate sodium (COLACE) 100 MG capsule Take 400 mg by mouth at bedtime.     . fluticasone (FLONASE) 50 MCG/ACT nasal spray 1-2 sprays each nostril once a day as needed 16 g 4  . Ketotifen Fumarate (ALAWAY OP) Place 1 drop into both eyes daily as needed (for allergies).    Marland Kitchen levothyroxine (SYNTHROID, LEVOTHROID) 75 MCG tablet Take 1 tablet (75 mcg total) by mouth daily. 90 tablet 3  . loratadine (CLARITIN) 10 MG tablet Take 10 mg by mouth daily as needed for allergies.     . Multiple Vitamin (MULTIVITAMIN) tablet Take by mouth daily.    . temazepam (RESTORIL) 30 MG capsule TAKE 1 CAPSULE (30 MG TOTAL) BY MOUTH AT BEDTIME AS NEEDED. 30 capsule 1  . Zoster Vaccine Adjuvanted [facility]) injection Shingrix vaccine now and repeat in 2-6 months. 0.5 mL 1   No facility-administered medications prior to visit.     Allergies  Allergen Reactions  . Diflucan [Fluconazole] Hives     unknown  . Thimerosal Hives  . Mercury Detox [Nutritional Supplements] Hives  . Influenza Vaccine Recombinant Rash    Mercury component in flu vaccine  . Levaquin [Levofloxacin] Rash  . Sulfa Antibiotics Rash    ROS Review of Systems  Constitutional:  Positive for appetite change, chills, diaphoresis, fatigue and fever.  HENT: Negative.   Eyes: Negative.   Respiratory: Positive for cough.   Cardiovascular: Negative.        Chest congestion  Gastrointestinal: Negative.   Endocrine: Negative.   Genitourinary: Negative.   Musculoskeletal: Negative.   Skin: Negative.   Allergic/Immunologic: Negative.   Neurological: Positive for weakness.  Hematological: Negative.   Psychiatric/Behavioral: Negative.    Objective:    Physical Exam  Constitutional: She is oriented to person, place, and time. She appears well-developed and well-nourished.  HENT:  Head: Normocephalic and atraumatic.  Eyes: Conjunctivae are normal.  Neck: Normal range of motion. Neck supple.  Cardiovascular: Normal rate, regular rhythm, normal heart sounds and intact distal pulses.  Pulmonary/Chest: Effort normal and breath sounds normal.  Abdominal: Soft. Bowel sounds are normal.  Musculoskeletal: Normal range of motion.  Neurological: She is alert and oriented to person, place, and time.  Skin: Skin is warm and dry.  Psychiatric: She has a normal mood and affect. Her behavior is normal. Judgment and thought content normal.  Nursing note and vitals reviewed.  BP (!) 147/74 (BP Location: Right Arm, Patient Position: Sitting, Cuff Size: Normal)   Pulse 94   Temp 98.8 F (37.1 C) (Oral)   Ht 5\' 3"  (1.6 m)   Wt 158 lb (71.7 kg)   LMP 04/11/2003 (Approximate)   SpO2 95%   BMI 27.99 kg/m  Wt Readings from Last 3 Encounters:  05/04/18 158 lb (71.7 kg)  02/02/18 159 lb (72.1 kg)  12/27/17 158 lb 9.6 oz (71.9 kg)   There are no preventive care reminders to display for this patient.  There are no preventive  care reminders to display for this patient.  Lab Results  Component Value Date   TSH 0.996 02/02/2018   Lab Results  Component Value Date   WBC 3.7 (A) 04/30/2017   HGB 12.8 04/30/2017   HCT 37.8 04/30/2017   MCV 87.6 04/30/2017   PLT 179.0 05/22/2016   Lab Results  Component Value Date   NA 139 05/22/2016   K 4.4 05/22/2016   CO2 30 05/22/2016   GLUCOSE 68 (L) 05/22/2016   BUN 14 05/22/2016   CREATININE 0.84 05/22/2016   CALCIUM 10.3 05/22/2016   ANIONGAP 9 03/13/2015   GFR 72.65 05/22/2016   Lab Results  Component Value Date   CHOL 253 (H) 09/28/2016   Lab Results  Component Value Date   HDL 71.00 09/28/2016   Lab Results  Component Value Date   LDLCALC 163 (H) 09/28/2016   Lab Results  Component Value Date   TRIG 92.0 09/28/2016   Lab Results  Component Value Date   CHOLHDL 4 09/28/2016   Lab Results  Component Value Date   HGBA1C 5.8 04/23/2017     Assessment & Plan:   1. Flu-like symptoms Positive for Influenza B.  - POCT Influenza A/B  2. Asthma Stable.   3. Influenza with respiratory manifestation other than pneumonia Positive for Influenza B today. Motrin and Acetaminophen as needed for body aches. Increase fluids. Additional discharge instructions on AVS   3. Cough Stable. Not worsening. She will continue OTC Theraflu and Robitussin as directed.   5. Follow up She will follow up as needed.   No orders of the defined types were placed in this encounter.  Kathe Becton,  MSN, FNP-C [facility] [place], [place] [address] [phone] Problem List Items Addressed This  Visit    None    Visit Diagnoses    Flu-like symptoms    -  Primary   Relevant Orders   POCT Influenza A/B (Completed)   Influenza with respiratory manifestation other than pneumonia       Cough       Follow up          No orders of the defined types were placed in this encounter.   Follow-up: No follow-ups  on file.    Azzie Glatter, FNP

## 2018-05-04 NOTE — Patient Instructions (Addendum)
If you have lab work done today you will be contacted with your lab results within the next 2 weeks.  If you have not heard from [place] then please contact us. The fastest way to get your results is to register for My Chart.   IF you received an x-ray today, you will receive an invoice from [facility]. Please contact [facility] at [phone] with questions or concerns regarding your invoice.   IF you received labwork today, you will receive an invoice from [facility]. Please contact [facility] at [phone] with questions or concerns regarding your invoice.   Our billing staff will not be able to assist you with questions regarding bills from these companies.  You will be contacted with the lab results as soon as they are available. The fastest way to get your results is to activate your My Chart account. Instructions are located on the last page of this paperwork. If you have not heard from [place] regarding the results in 2 weeks, please contact this office.     Guaifenesin oral ER tablets What is this medicine? GUAIFENESIN (gwye FEN e sin) is an expectorant. It helps to thin mucous and make coughs more productive. This medicine is used to treat coughs caused by colds or the flu. It is not intended to treat chronic cough caused by smoking, asthma, emphysema, or heart failure. This medicine may be used for other purposes; ask your health care provider or pharmacist if you have questions. COMMON BRAND NAME(S): Humibid, Mucinex What should I tell my health care provider before I take this medicine? They need to know if you have any of these conditions: -fever -kidney disease -an unusual or allergic reaction to guaifenesin, other medicines, foods, dyes, or preservatives -pregnant or trying to get pregnant -breast-feeding How should I use this medicine? Take this medicine by mouth with a full glass of water. Follow the directions on the prescription label. Do not break, chew  or crush this medicine. You may take with food or on an empty stomach. Take your medicine at regular intervals. Do not take your medicine more often than directed. Talk to your pediatrician regarding the use of this medicine in children. While this drug may be prescribed for children as young as 12 years old for selected conditions, precautions do apply. Overdosage: If you think you have taken too much of this medicine contact a poison control center or emergency room at once. NOTE: This medicine is only for you. Do not share this medicine with others. What if I miss a dose? If you miss a dose, take it as soon as you can. If it is almost time for your next dose, take only that dose. Do not take double or extra doses. What may interact with this medicine? Interactions are not expected. This list may not describe all possible interactions. Give your health care provider a list of all the medicines, herbs, non-prescription drugs, or dietary supplements you use. Also tell them if you smoke, drink alcohol, or use illegal drugs. Some items may interact with your medicine. What should I watch for while using this medicine? Do not treat a cough for more than 1 week without consulting your doctor or health care professional. If you also have a high fever, skin rash, continuing headache, or sore throat, see your doctor. For best results, drink 6 to 8 glasses water daily while you are taking this medicine. What side effects may I notice from receiving this medicine? Side effects  that you should report to your doctor or health care professional as soon as possible: -allergic reactions like skin rash, itching or hives, swelling of the face, lips, or tongue Side effects that usually do not require medical attention (report to your doctor or health care professional if they continue or are bothersome): -dizziness -headache -stomach upset This list may not describe all possible side effects. Call your doctor for  medical advice about side effects. You may report side effects to FDA at 1-800-FDA-1088. Where should I keep my medicine? Keep out of the reach of children. Store at room temperature between 20 and 25 degrees C (68 and 77 degrees F). Keep container tightly closed. Throw away any unused medicine after the expiration date. NOTE: This sheet is a summary. It may not cover all possible information. If you have questions about this medicine, talk to your doctor, pharmacist, or health care provider.  2019 Elsevier/Gold Standard (2007-08-07 12:14:14) Acetaminophen tablets or caplets What is this medicine? ACETAMINOPHEN (a set a MEE noe fen) is a pain reliever. It is used to treat mild pain and fever. This medicine may be used for other purposes; ask your health care provider or pharmacist if you have questions. COMMON BRAND NAME(S): Aceta, Actamin, Anacin Aspirin Free, Genapap, Genebs, Mapap, Pain & Fever, Pain and Fever, PAIN RELIEF, PAIN RELIEF Extra Strength, Pain Reliever, Panadol, PHARBETOL, Q-Pap, Q-Pap Extra Strength, Tylenol, Tylenol CrushableTablet, Tylenol Extra Strength, XS No Aspirin, XS Pain Reliever What should I tell my health care provider before I take this medicine? They need to know if you have any of these conditions: -if you often drink alcohol -liver disease -an unusual or allergic reaction to acetaminophen, other medicines, foods, dyes, or preservatives -pregnant or trying to get pregnant -breast-feeding How should I use this medicine? Take this medicine by mouth with a glass of water. Follow the directions on the package or prescription label. Take your medicine at regular intervals. Do not take your medicine more often than directed. Talk to your pediatrician regarding the use of this medicine in children. While this drug may be prescribed for children as young as 76 years of age for selected conditions, precautions do apply. Overdosage: If you think you have taken too much of  this medicine contact a poison control center or emergency room at once. NOTE: This medicine is only for you. Do not share this medicine with others. What if I miss a dose? If you miss a dose, take it as soon as you can. If it is almost time for your next dose, take only that dose. Do not take double or extra doses. What may interact with this medicine? -alcohol -imatinib -isoniazid -other medicines with acetaminophen This list may not describe all possible interactions. Give your health care provider a list of all the medicines, herbs, non-prescription drugs, or dietary supplements you use. Also tell them if you smoke, drink alcohol, or use illegal drugs. Some items may interact with your medicine. What should I watch for while using this medicine? Tell your doctor or health care professional if the pain lasts more than 10 days (5 days for children), if it gets worse, or if there is a new or different kind of pain. Also, check with your doctor if a fever lasts for more than 3 days. Do not take other medicines that contain acetaminophen with this medicine. Always read labels carefully. If you have questions, ask your doctor or pharmacist. If you take too much acetaminophen get medical help right  away. Too much acetaminophen can be very dangerous and cause liver damage. Even if you do not have symptoms, it is important to get help right away. What side effects may I notice from receiving this medicine? Side effects that you should report to your doctor or health care professional as soon as possible: -allergic reactions like skin rash, itching or hives, swelling of the face, lips, or tongue -breathing problems -fever or sore throat -redness, blistering, peeling or loosening of the skin, including inside the mouth -trouble passing urine or change in the amount of urine -unusual bleeding or bruising -unusually weak or tired -yellowing of the eyes or skin Side effects that usually do not require  medical attention (report to your doctor or health care professional if they continue or are bothersome): -headache -nausea, stomach upset This list may not describe all possible side effects. Call your doctor for medical advice about side effects. You may report side effects to FDA at 1-800-FDA-1088. Where should I keep my medicine? Keep out of reach of children. Store at room temperature between 20 and 25 degrees C (68 and 77 degrees F). Protect from moisture and heat. Throw away any unused medicine after the expiration date. NOTE: This sheet is a summary. It may not cover all possible information. If you have questions about this medicine, talk to your doctor, pharmacist, or health care provider.  2019 Elsevier/Gold Standard (2012-11-18 12:54:16) Ibuprofen tablets and capsules What is this medicine? IBUPROFEN (eye BYOO proe fen) is a non-steroidal anti-inflammatory drug (NSAID). It is used for dental pain, fever, headaches or migraines, osteoarthritis, rheumatoid arthritis, or painful monthly periods. It can also relieve minor aches and pains caused by a cold, flu, or sore throat. This medicine may be used for other purposes; ask your health care provider or pharmacist if you have questions. COMMON BRAND NAME(S): Advil, Advil Junior Strength, Advil Migraine, Genpril, Ibren, IBU, Midol, Midol Cramps and Body Aches, Motrin, Motrin IB, Motrin Junior Strength, Motrin Migraine Pain, Samson-8, Toxicology Saliva Collection What should I tell my health care provider before I take this medicine? They need to know if you have any of these conditions: -cigarette smoker -coronary artery bypass graft (CABG) surgery within the past 2 weeks -drink more than 3 alcohol-containing drinks a day -heart disease -high blood pressure -history of stomach bleeding -kidney disease -liver disease -lung or breathing disease, like asthma -an unusual or allergic reaction to ibuprofen, aspirin, other NSAIDs, other  medicines, foods, dyes, or preservatives -pregnant or trying to get pregnant -breast-feeding How should I use this medicine? Take this medicine by mouth with a glass of water. Follow the directions on the prescription label. Take this medicine with food if your stomach gets upset. Try to not lie down for at least 10 minutes after you take the medicine. Take your medicine at regular intervals. Do not take your medicine more often than directed. A special MedGuide will be given to you by the pharmacist with each prescription and refill. Be sure to read this information carefully each time. Talk to your pediatrician regarding the use of this medicine in children. Special care may be needed. Overdosage: If you think you have taken too much of this medicine contact a poison control center or emergency room at once. NOTE: This medicine is only for you. Do not share this medicine with others. What if I miss a dose? If you miss a dose, take it as soon as you can. If it is almost time for your next dose,  take only that dose. Do not take double or extra doses. What may interact with this medicine? Do not take this medicine with any of the following medications: -cidofovir -ketorolac -methotrexate -pemetrexed This medicine may also interact with the following medications: -alcohol -aspirin -diuretics -lithium -other drugs for inflammation like prednisone -warfarin This list may not describe all possible interactions. Give your health care provider a list of all the medicines, herbs, non-prescription drugs, or dietary supplements you use. Also tell them if you smoke, drink alcohol, or use illegal drugs. Some items may interact with your medicine. What should I watch for while using this medicine? Tell your doctor or healthcare professional if your symptoms do not start to get better or if they get worse. This medicine does not prevent heart attack or stroke. In fact, this medicine may increase the  chance of a heart attack or stroke. The chance may increase with longer use of this medicine and in people who have heart disease. If you take aspirin to prevent heart attack or stroke, talk with your doctor or health care professional. Do not take other medicines that contain aspirin, ibuprofen, or naproxen with this medicine. Side effects such as stomach upset, nausea, or ulcers may be more likely to occur. Many medicines available without a prescription should not be taken with this medicine. This medicine can cause ulcers and bleeding in the stomach and intestines at any time during treatment. Ulcers and bleeding can happen without warning symptoms and can cause death. To reduce your risk, do not smoke cigarettes or drink alcohol while you are taking this medicine. You may get drowsy or dizzy. Do not drive, use machinery, or do anything that needs mental alertness until you know how this medicine affects you. Do not stand or sit up quickly, especially if you are an older patient. This reduces the risk of dizzy or fainting spells. This medicine can cause you to bleed more easily. Try to avoid damage to your teeth and gums when you brush or floss your teeth. This medicine may be used to treat migraines. If you take migraine medicines for 10 or more days a month, your migraines may get worse. Keep a diary of headache days and medicine use. Contact your healthcare professional if your migraine attacks occur more frequently. What side effects may I notice from receiving this medicine? Side effects that you should report to your doctor or health care professional as soon as possible: -allergic reactions like skin rash, itching or hives, swelling of the face, lips, or tongue -severe stomach pain -signs and symptoms of bleeding such as bloody or black, tarry stools; red or dark-brown urine; spitting up blood or brown material that looks like coffee grounds; red spots on the skin; unusual bruising or bleeding  from the eye, gums, or nose -signs and symptoms of a blood clot such as changes in vision; chest pain; severe, sudden headache; trouble speaking; sudden numbness or weakness of the face, arm, or leg -unexplained weight gain or swelling -unusually weak or tired -yellowing of eyes or skin Side effects that usually do not require medical attention (report to your doctor or health care professional if they continue or are bothersome): -bruising -diarrhea -dizziness, drowsiness -headache -nausea, vomiting This list may not describe all possible side effects. Call your doctor for medical advice about side effects. You may report side effects to FDA at 1-800-FDA-1088. Where should I keep my medicine? Keep out of the reach of children. Store at room temperature between 15  and 30 degrees C (59 and 86 degrees F). Keep container tightly closed. Throw away any unused medicine after the expiration date. NOTE: This sheet is a summary. It may not cover all possible information. If you have questions about this medicine, talk to your doctor, pharmacist, or health care provider.  2019 Elsevier/Gold Standard (2016-11-29 12:43:57)  Cough, Adult  A cough helps to clear your throat and lungs. A cough may last only 2-3 weeks (acute), or it may last longer than 8 weeks (chronic). Many different things can cause a cough. A cough may be a sign of an illness or another medical condition. Follow these instructions at home:  Pay attention to any changes in your cough.  Take medicines only as told by your doctor. ? If you were prescribed an antibiotic medicine, take it as told by your doctor. Do not stop taking it even if you start to feel better. ? Talk with your doctor before you try using a cough medicine.  Drink enough fluid to keep your pee (urine) clear or pale yellow.  If the air is dry, use a cold steam vaporizer or humidifier in your home.  Stay away from things that make you cough at work or at  home.  If your cough is worse at night, try using extra pillows to raise your head up higher while you sleep.  Do not smoke, and try not to be around smoke. If you need help quitting, ask your doctor.  Do not have caffeine.  Do not drink alcohol.  Rest as needed. Contact a doctor if:  You have new problems (symptoms).  You cough up yellow fluid (pus).  Your cough does not get better after 2-3 weeks, or your cough gets worse.  Medicine does not help your cough and you are not sleeping well.  You have pain that gets worse or pain that is not helped with medicine.  You have a fever.  You are losing weight and you do not know why.  You have night sweats. Get help right away if:  You cough up blood.  You have trouble breathing.  Your heartbeat is very fast. This information is not intended to replace advice given to you by your health care provider. Make sure you discuss any questions you have with your health care provider. Document Released: 12/08/2010 Document Revised: 09/02/2015 Document Reviewed: 06/03/2014 Elsevier Interactive Patient Education  2019 Elsevier Inc.  Influenza, Adult Influenza is also called "the flu." It is an infection in the lungs, nose, and throat (respiratory tract). It is caused by a virus. The flu causes symptoms that are similar to symptoms of a cold. It also causes a high fever and body aches. The flu spreads easily from person to person (is contagious). Getting a flu shot (influenza vaccination) every year is the best way to prevent the flu. What are the causes? This condition is caused by the influenza virus. You can get the virus by:  Breathing in droplets that are in the air from the cough or sneeze of a person who has the virus.  Touching something that has the virus on it (is contaminated) and then touching your mouth, nose, or eyes. What increases the risk? Certain things may make you more likely to get the flu. These include:  Not  washing your hands often.  Having close contact with many people during cold and flu season.  Touching your mouth, eyes, or nose without first washing your hands.  Not getting a flu  shot every year. You may have a higher risk for the flu, along with serious problems such as a lung infection (pneumonia), if you:  Are older than 65.  Are pregnant.  Have a weakened disease-fighting system (immune system) because of a disease or taking certain medicines.  Have a long-term (chronic) illness, such as: ? Heart, kidney, or lung disease. ? Diabetes. ? Asthma.  Have a liver disorder.  Are very overweight (morbidly obese).  Have anemia. This is a condition that affects your red blood cells. What are the signs or symptoms? Symptoms usually begin suddenly and last 4-14 days. They may include:  Fever and chills.  Headaches, body aches, or muscle aches.  Sore throat.  Cough.  Runny or stuffy (congested) nose.  Chest discomfort.  Not wanting to eat as much as normal (poor appetite).  Weakness or feeling tired (fatigue).  Dizziness.  Feeling sick to your stomach (nauseous) or throwing up (vomiting). How is this treated? If the flu is found early, you can be treated with medicine that can help reduce how bad the illness is and how long it lasts (antiviral medicine). This may be given by mouth (orally) or through an IV tube. Taking care of yourself at home can help your symptoms get better. Your doctor may suggest:  Taking over-the-counter medicines.  Drinking plenty of fluids. The flu often goes away on its own. If you have very bad symptoms or other problems, you may be treated in a hospital. Follow these instructions at home:     Activity  Rest as needed. Get plenty of sleep.  Stay home from work or school as told by your doctor. ? Do not leave home until you do not have a fever for 24 hours without taking medicine. ? Leave home only to visit your doctor. Eating and  drinking  Take an ORS (oral rehydration solution). This is a drink that is sold at pharmacies and stores.  Drink enough fluid to keep your pee (urine) pale yellow.  Drink clear fluids in small amounts as you are able. Clear fluids include: ? Water. ? Ice chips. ? Fruit juice that has water added (diluted fruit juice). ? Low-calorie sports drinks.  Eat bland, easy-to-digest foods in small amounts as you are able. These foods include: ? Bananas. ? Applesauce. ? Rice. ? Lean meats. ? Toast. ? Crackers.  Do not eat or drink: ? Fluids that have a lot of sugar or caffeine. ? Alcohol. ? Spicy or fatty foods. General instructions  Take over-the-counter and prescription medicines only as told by your doctor.  Use a cool mist humidifier to add moisture to the air in your home. This can make it easier for you to breathe.  Cover your mouth and nose when you cough or sneeze.  Wash your hands with soap and water often, especially after you cough or sneeze. If you cannot use soap and water, use alcohol-based hand sanitizer.  Keep all follow-up visits as told by your doctor. This is important. How is this prevented?   Get a flu shot every year. You may get the flu shot in late summer, fall, or winter. Ask your doctor when you should get your flu shot.  Avoid contact with people who are sick during fall and winter (cold and flu season). Contact a doctor if:  You get new symptoms.  You have: ? Chest pain. ? Watery poop (diarrhea). ? A fever.  Your cough gets worse.  You start to have more mucus.  You feel sick to your stomach.  You throw up. Get help right away if you:  Have shortness of breath.  Have trouble breathing.  Have skin or nails that turn a bluish color.  Have very bad pain or stiffness in your neck.  Get a sudden headache.  Get sudden pain in your face or ear.  Cannot eat or drink without throwing up. Summary  Influenza ("the flu") is an infection  in the lungs, nose, and throat. It is caused by a virus.  Take over-the-counter and prescription medicines only as told by your doctor.  Getting a flu shot every year is the best way to avoid getting the flu. This information is not intended to replace advice given to you by your health care provider. Make sure you discuss any questions you have with your health care provider. Document Released: 01/04/2008 Document Revised: 09/12/2017 Document Reviewed: 09/12/2017 Elsevier Interactive Patient Education  2019 [employer].

## 2018-05-05 DIAGNOSIS — R059 Cough, unspecified: Secondary | ICD-10-CM | POA: Insufficient documentation

## 2018-05-05 DIAGNOSIS — J111 Influenza due to unidentified influenza virus with other respiratory manifestations: Secondary | ICD-10-CM | POA: Insufficient documentation

## 2018-05-05 DIAGNOSIS — R05 Cough: Secondary | ICD-10-CM | POA: Insufficient documentation

## 2018-05-20 ENCOUNTER — Ambulatory Visit (INDEPENDENT_AMBULATORY_CARE_PROVIDER_SITE_OTHER): Payer: Medicare HMO | Admitting: Family Medicine

## 2018-05-20 ENCOUNTER — Encounter: Payer: Self-pay | Admitting: Family Medicine

## 2018-05-20 VITALS — BP 120/80 | HR 77 | Temp 98.9°F | Ht 63.0 in | Wt 159.0 lb

## 2018-05-20 DIAGNOSIS — J0100 Acute maxillary sinusitis, unspecified: Secondary | ICD-10-CM

## 2018-05-20 MED ORDER — AMOXICILLIN-POT CLAVULANATE 875-125 MG PO TABS: 1.0000 | ORAL_TABLET | Freq: Two times a day (BID) | ORAL | 0 refills | Status: DC

## 2018-05-20 NOTE — Progress Notes (Signed)
2/10/20205:49 PM  Christine Valdez 02-May-1950, 68 y.o. female [id number]  Chief Complaint  Patient presents with  . Follow-up    has already been dx with the flu, not feeling well at all. Cannot take the flu shot due to being allergy. Has headache, congestion and drainage. Taking no meds for the symptoms    HPI:   Patient is a 68 y.o. female with past medical history significant for hypothyrodism, depression, asthma who presents today for followup  Diagnosed with infuenza A on May 04 2018 Was outside window for treatment Was treated supportively Reports allergy to influenza vaccine Feeling very tired, having horrible headache and sore throat, having thick green nasal congestion with really bad taste Coughing is better Not having any fever or chills, no SOB Raced into clinic today Has been doing sudafed and ibuprofen  Fall Risk  05/20/2018 05/04/2018 02/02/2018 12/27/2017 05/10/2017  Falls in the past year? 0 0 No No No  Number falls in past yr: 0 - - - -  Injury with Fall? 0 - - - -     Depression screen [facility] 2/9 05/20/2018 05/04/2018 02/02/2018  Decreased Interest 0 0 0  Down, Depressed, Hopeless 0 0 0  PHQ - 2 Score 0 0 0    Allergies  Allergen Reactions  . Diflucan [Fluconazole] Hives    unknown  . Thimerosal Hives  . Mercury Detox [Nutritional Supplements] Hives  . Influenza Vaccine Recombinant Rash    Mercury component in flu vaccine  . Levaquin [Levofloxacin] Rash  . Sulfa Antibiotics Rash    Prior to Admission medications   Medication Sig Start Date End Date Taking? Authorizing Provider  albuterol (PROVENTIL HFA;VENTOLIN HFA) 108 (90 Base) MCG/ACT inhaler Inhale 2 puffs into the lungs every 6 (six) hours as needed for shortness of breath. 05/10/17  Yes Forrest Moron, MD  ASPERCREME LIDOCAINE EX Apply topically. For elbow pain   Yes [provider]  cholecalciferol (VITAMIN D) 1000 UNITS tablet Take 1,000 Units by mouth daily.   Yes [provider]  citalopram (CELEXA) 40 MG tablet Take 1 tablet (40 mg total) by mouth daily. 07/19/17  Yes Megan Salon, MD  docusate sodium (COLACE) 100 MG capsule Take 400 mg by mouth at bedtime.    Yes [provider]  fluticasone (FLONASE) 50 MCG/ACT nasal spray 1-2 sprays each nostril once a day as needed 03/04/18  Yes Stallings, Zoe A, MD  Ketotifen Fumarate (ALAWAY OP) Place 1 drop into both eyes daily as needed (for allergies).   Yes [provider]  levothyroxine (SYNTHROID, LEVOTHROID) 75 MCG tablet Take 1 tablet (75 mcg total) by mouth daily. 02/09/18  Yes Delia Chimes A, MD  loratadine (CLARITIN) 10 MG tablet Take 10 mg by mouth daily as needed for allergies.    Yes [provider]  Multiple Vitamin (MULTIVITAMIN) tablet Take by mouth daily.   Yes [provider]  temazepam (RESTORIL) 30 MG capsule TAKE 1 CAPSULE (30 MG TOTAL) BY MOUTH AT BEDTIME AS NEEDED. 01/11/18  Yes Megan Salon, MD    Past Medical History:  Diagnosis Date  . Allergy    uses Flonase daily as needed and takes Loratadine daily   . Anemia    only after birth of 2nd child  . Arthritis   . Asthma    uses Albuterol daily as needed;exercise induced  . 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 Depression    takes Celexa daily  .  GERD (gastroesophageal reflux disease)    occ  . History of colon polyps   . History of shingles    5-6 yrs  . Hyperlipidemia   . Hypothyroid   . Insomnia    takes Restoril nightly as needed  . Interstitial cystitis   . Osteoarthritis   . Sjogren's syndrome ([place])   . Spinal headache    after 1st C/S, o2sat low after shoulder surgery after went to room(51%),hard to urinate)    Past Surgical History:  Procedure Laterality Date  . CARPAL TUNNEL RELEASE Right 05/2007  . CESAREAN SECTION  1981/1984   x 2  . COLONOSCOPY    . TONSILLECTOMY AND ADENOIDECTOMY  age 89  . TOTAL SHOULDER ARTHROPLASTY Right 07/04/2013   DR NORRIS  .  TOTAL SHOULDER ARTHROPLASTY Right 07/04/2013   Procedure: RIGHT TOTAL SHOULDER ARTHROPLASTY;  Surgeon: Augustin Schooling, MD;  Location: [facility];  Service: Orthopedics;  Laterality: Right;  . TOTAL SHOULDER ARTHROPLASTY Left 03/12/2015   Procedure: LEFT SHOULDER TOTAL SHOULDER ARTHROPLASTY;  Surgeon: Netta Cedars, MD;  Location: [facility];  Service: Orthopedics;  Laterality: Left;    Social History   Tobacco Use  . Smoking status: Never Smoker  . Smokeless tobacco: Never Used  Substance Use Topics  . Alcohol use: Yes    Alcohol/week: 0.0 standard drinks    Comment: occ wine    Family History  Problem Relation Age of Onset  . Breast cancer Sister 45       stage 4, chemo and radiation  . Esophageal cancer Sister   . Diabetes Brother   . Epilepsy Daughter   . Hypertension Mother   . Osteoporosis Mother   . Diabetes Father   . Hypertension Father   . Lung cancer Paternal Aunt     ROS Per hpi  OBJECTIVE:  Blood pressure 120/80, pulse 77, temperature 98.9 F (37.2 C), temperature source Oral, height 5\' 3"  (1.6 m), weight 159 lb (72.1 kg), last menstrual period 04/11/2003, SpO2 98 %. Body mass index is 28.17 kg/m.    Physical Exam Vitals signs and nursing note reviewed.  Constitutional:      Appearance: She is well-developed.  HENT:     Head: Normocephalic and atraumatic.     Right Ear: Hearing, tympanic membrane, ear canal and external ear normal.     Left Ear: Hearing, tympanic membrane, ear canal and external ear normal.     Nose:     Right Sinus: Maxillary sinus tenderness and frontal sinus tenderness present.     Left Sinus: Maxillary sinus tenderness and frontal sinus tenderness present.  Eyes:     Conjunctiva/sclera: Conjunctivae normal.     Pupils: Pupils are equal, round, and reactive to light.  Neck:     Musculoskeletal: Neck supple.  Cardiovascular:     Rate and Rhythm: Normal rate and regular rhythm.     Heart sounds: Normal heart sounds. No murmur. No friction  rub. No gallop.   Pulmonary:     Effort: Pulmonary effort is normal.     Breath sounds: Normal breath sounds. No wheezing or rales.  Lymphadenopathy:     Cervical: No cervical adenopathy.  Skin:    General: Skin is warm and dry.  Neurological:     Mental Status: She is alert and oriented to person, place, and time.     ASSESSMENT and PLAN  1. Acute non-recurrent maxillary sinusitis Discussed supportive measures, new meds r/se/b and RTC precautions. Patient educational handout given. - Care  order/instruction:  Other orders - amoxicillin-clavulanate (AUGMENTIN) 875-125 MG tablet; Take 1 tablet by mouth 2 (two) times daily.  Return if symptoms worsen or fail to improve.    Rutherford Guys, MD [facility] at [facility] [place], [place] [address] Ph.  [phone] Fax [phone]

## 2018-05-20 NOTE — Patient Instructions (Signed)
Trial of phenylephrine oral decongestant, 10mg  q 6 hours  Sinusitis, Adult Sinusitis is soreness and swelling (inflammation) of your sinuses. Sinuses are hollow spaces in the bones around your face. They are located:  Around your eyes.  In the middle of your forehead.  Behind your nose.  In your cheekbones. Your sinuses and nasal passages are lined with a fluid called mucus. Mucus drains out of your sinuses. Swelling can trap mucus in your sinuses. This lets germs (bacteria, virus, or fungus) grow, which leads to infection. Most of the time, this condition is caused by a virus. What are the causes? This condition is caused by:  Allergies.  Asthma.  Germs.  Things that block your nose or sinuses.  Growths in the nose (nasal polyps).  Chemicals or irritants in the air.  Fungus (rare). What increases the risk? You are more likely to develop this condition if:  You have a weak body defense system (immune system).  You do a lot of swimming or diving.  You use nasal sprays too much.  You smoke. What are the signs or symptoms? The main symptoms of this condition are pain and a feeling of pressure around the sinuses. Other symptoms include:  Stuffy nose (congestion).  Runny nose (drainage).  Swelling and warmth in the sinuses.  Headache.  Toothache.  A cough that may get worse at night.  Mucus that collects in the throat or the back of the nose (postnasal drip).  Being unable to smell and taste.  Being very tired (fatigue).  A fever.  Sore throat.  Bad breath. How is this diagnosed? This condition is diagnosed based on:  Your symptoms.  Your medical history.  A physical exam.  Tests to find out if your condition is short-term (acute) or long-term (chronic). Your doctor may: ? Check your nose for growths (polyps). ? Check your sinuses using a tool that has a light (endoscope). ? Check for allergies or germs. ? Do imaging tests, such as an MRI or CT  scan. How is this treated? Treatment for this condition depends on the cause and whether it is short-term or long-term.  If caused by a virus, your symptoms should go away on their own within 10 days. You may be given medicines to relieve symptoms. They include: ? Medicines that shrink swollen tissue in the nose. ? Medicines that treat allergies (antihistamines). ? A spray that treats swelling of the nostrils. ? Rinses that help get rid of thick mucus in your nose (nasal saline washes).  If caused by bacteria, your doctor may wait to see if you will get better without treatment. You may be given antibiotic medicine if you have: ? A very bad infection. ? A weak body defense system.  If caused by growths in the nose, you may need to have surgery. Follow these instructions at home: Medicines  Take, use, or apply over-the-counter and prescription medicines only as told by your doctor. These may include nasal sprays.  If you were prescribed an antibiotic medicine, take it as told by your doctor. Do not stop taking the antibiotic even if you start to feel better. Hydrate and humidify   Drink enough water to keep your pee (urine) pale yellow.  Use a cool mist humidifier to keep the humidity level in your home above 50%.  Breathe in steam for 10-15 minutes, 3-4 times a day, or as told by your doctor. You can do this in the bathroom while a hot shower is running.  Try not to spend time in cool or dry air. Rest  Rest as much as you can.  Sleep with your head raised (elevated).  Make sure you get enough sleep each night. General instructions   Put a warm, moist washcloth on your face 3-4 times a day, or as often as told by your doctor. This will help with discomfort.  Wash your hands often with soap and water. If there is no soap and water, use hand sanitizer.  Do not smoke. Avoid being around people who are smoking (secondhand smoke).  Keep all follow-up visits as told by your  doctor. This is important. Contact a doctor if:  You have a fever.  Your symptoms get worse.  Your symptoms do not get better within 10 days. Get help right away if:  You have a very bad headache.  You cannot stop throwing up (vomiting).  You have very bad pain or swelling around your face or eyes.  You have trouble seeing.  You feel confused.  Your neck is stiff.  You have trouble breathing. Summary  Sinusitis is swelling of your sinuses. Sinuses are hollow spaces in the bones around your face.  This condition is caused by tissues in your nose that become inflamed or swollen. This traps germs. These can lead to infection.  If you were prescribed an antibiotic medicine, take it as told by your doctor. Do not stop taking it even if you start to feel better.  Keep all follow-up visits as told by your doctor. This is important. This information is not intended to replace advice given to you by your health care provider. Make sure you discuss any questions you have with your health care provider. Document Released: 09/13/2007 Document Revised: 08/27/2017 Document Reviewed: 08/27/2017 Elsevier Interactive Patient Education  2019 [employer].

## 2018-06-14 ENCOUNTER — Ambulatory Visit: Payer: Medicare HMO | Admitting: Family Medicine

## 2018-07-02 ENCOUNTER — Telehealth: Payer: Self-pay | Admitting: *Deleted

## 2018-07-02 NOTE — Telephone Encounter (Signed)
LEFT DETAILED MESSAGE TO GIVE A DAY AND TIME Monday -Thursday  8AM TO 2 BEING LAST APPOINTMENT I CAN SCHEDULE AND CALL HER BACK.

## 2018-08-12 ENCOUNTER — Other Ambulatory Visit: Payer: Self-pay | Admitting: Obstetrics & Gynecology

## 2018-08-13 NOTE — Telephone Encounter (Signed)
Medication refill request: temazepam 30mg  Last AEX:  07-19-17 Next AEX: 10-03-2018 Last MMG (if hormonal medication request): n/a Refill authorized: please approve if appropriate

## 2018-08-31 ENCOUNTER — Other Ambulatory Visit: Payer: Self-pay | Admitting: Family Medicine

## 2018-08-31 NOTE — Telephone Encounter (Signed)
Requested medication (s) are due for refill today - expired Rx  Requested medication (s) are on the active medication list -yes  Future visit scheduled -no  Last refill: 05/10/17  Notes to clinic: Patient does have Diagnosed: asthma- but her last Rx has expired. Rx request sent for review.  Requested Prescriptions  Pending Prescriptions Disp Refills   PROAIR HFA 108 (90 Base) MCG/ACT inhaler [Pharmacy Med Name: PROAIR HFA 90 MCG INHALER] 8.5 Inhaler 1    Sig: INHALE 2 PUFFS INTO THE LUNGS EVERY 6 (SIX) HOURS AS NEEDED FOR SHORTNESS OF BREATH.     Pulmonology:  Beta Agonists Failed - 08/31/2018  1:50 PM      Failed - One inhaler should last at least one month. If the patient is requesting refills earlier, contact the patient to check for uncontrolled symptoms.      Passed - Valid encounter within last 12 months    Recent Outpatient Visits          3 months ago Acute non-recurrent maxillary sinusitis   [facility], Lilia Argue, MD   3 months ago Flu-like symptoms   [facility] at [facility], Ellie Lunch, FNP   7 months ago Acquired hypothyroidism   [facility] at [facility], Arlie Solomons, MD   8 months ago Sore throat   [facility] at [facility], Arlie Solomons, MD   1 year ago Cough   [facility] at [facility], MD              Requested Prescriptions  Pending Prescriptions Disp Refills   PROAIR HFA 108 (90 Base) MCG/ACT inhaler [Pharmacy Med Name: PROAIR HFA 90 MCG INHALER] 8.5 Inhaler 1    Sig: INHALE 2 PUFFS INTO THE LUNGS EVERY 6 (SIX) HOURS AS NEEDED FOR SHORTNESS OF BREATH.     Pulmonology:  Beta Agonists Failed - 08/31/2018  1:50 PM      Failed - One inhaler should last at least one month. If the patient is requesting refills earlier, contact the patient to check for uncontrolled symptoms.      Passed - Valid encounter within last 12 months    Recent Outpatient Visits          3 months ago Acute non-recurrent maxillary sinusitis   [facility], Lilia Argue, MD   3 months ago Flu-like symptoms   [facility] at [facility], Ellie Lunch, FNP   7 months ago Acquired hypothyroidism   [facility] at [facility], Arlie Solomons, MD   8 months ago Sore throat   [facility] at [facility], Arlie Solomons, MD   1 year ago Cough   Primary Care at [facility], Arlie Solomons, MD

## 2018-09-05 ENCOUNTER — Other Ambulatory Visit: Payer: Self-pay | Admitting: Obstetrics & Gynecology

## 2018-09-05 NOTE — Telephone Encounter (Signed)
Medication refill request: Citalopram Last AEX:  07/19/17 SM Next AEX: 10/03/18  Last MMG (if hormonal medication request): 07/10/17 BIRADS 1 negative/density c Refill authorized: Please advise; order pended #90 w/0 refills if authorized

## 2018-09-17 ENCOUNTER — Other Ambulatory Visit: Payer: Self-pay | Admitting: Physical Medicine and Rehabilitation

## 2018-09-17 DIAGNOSIS — Z1231 Encounter for screening mammogram for malignant neoplasm of breast: Secondary | ICD-10-CM

## 2018-10-03 ENCOUNTER — Ambulatory Visit (INDEPENDENT_AMBULATORY_CARE_PROVIDER_SITE_OTHER): Payer: Medicare HMO | Admitting: Obstetrics & Gynecology

## 2018-10-03 ENCOUNTER — Other Ambulatory Visit (HOSPITAL_COMMUNITY)
Admission: RE | Admit: 2018-10-03 | Discharge: 2018-10-03 | Disposition: A | Discharge status: Home / Self Care | Payer: Medicare HMO | Source: Ambulatory Visit | Attending: Obstetrics & Gynecology | Admitting: Obstetrics & Gynecology

## 2018-10-03 ENCOUNTER — Other Ambulatory Visit: Payer: Self-pay

## 2018-10-03 ENCOUNTER — Encounter: Payer: Self-pay | Admitting: Obstetrics & Gynecology

## 2018-10-03 VITALS — BP 126/80 | HR 68 | Temp 97.3°F | Ht 63.25 in | Wt 156.6 lb

## 2018-10-03 DIAGNOSIS — E2839 Other primary ovarian failure: Secondary | ICD-10-CM | POA: Diagnosis not present

## 2018-10-03 DIAGNOSIS — Z01419 Encounter for gynecological examination (general) (routine) without abnormal findings: Secondary | ICD-10-CM

## 2018-10-03 DIAGNOSIS — Z124 Encounter for screening for malignant neoplasm of cervix: Secondary | ICD-10-CM | POA: Insufficient documentation

## 2018-10-03 MED ORDER — CITALOPRAM HYDROBROMIDE 40 MG PO TABS: 40.0000 mg | ORAL_TABLET | Freq: Every day | ORAL | 4 refills | Status: DC

## 2018-10-03 NOTE — Progress Notes (Signed)
68 y.o. [id number] Married White or Caucasian female here for annual exam.  Doing well.  Denies vaginal bleeding.    PCP:  Dr Nolon Rod  Patient's last menstrual period was 04/11/2003 (approximate).          Sexually active: No.  The current method of family planning is post menopausal status.    Exercising: Yes.    walking Smoker:  no  Health Maintenance: Pap:  04/17/16 Neg. HR HPV:neg   04/08/15 neg  History of abnormal Pap:  no MMG:  07/10/17 BIRADS1:neg. Has appt 11/05/18 Colonoscopy:  08/19/14 f/u 5 years BMD:   2009 Normal TDaP:  2015 Pneumonia vaccine(s):  2016 Shingrix:   Completed  Hep C testing: 04/08/15 neg  Screening Labs: PCP   reports that she has never smoked. She has never used smokeless tobacco. She reports current alcohol use of about 2.0 standard drinks of alcohol per week. She reports that she does not use drugs.  Past Medical History:  Diagnosis Date  . Allergy    uses Flonase daily as needed and takes Loratadine daily   . Anemia    only after birth of 2nd child  . Arthritis   . Asthma    uses Albuterol daily as needed;exercise induced  . Cancer ([place]) 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, right cheek  . Depression    takes Celexa daily  . GERD (gastroesophageal reflux disease)    occ  . History of colon polyps   . History of shingles    5-6 yrs  . Hyperlipidemia   . Hypothyroid   . Insomnia    takes Restoril nightly as needed  . Interstitial cystitis   . Osteoarthritis   . Sjogren's syndrome ([place])   . Spinal headache    after 1st C/S, o2sat low after shoulder surgery after went to room(51%),hard to urinate)    Past Surgical History:  Procedure Laterality Date  . CARPAL TUNNEL RELEASE Right 05/2007  . CESAREAN SECTION  1981/1984   x 2  . COLONOSCOPY    . TONSILLECTOMY AND ADENOIDECTOMY  age 77  . TOTAL SHOULDER ARTHROPLASTY Right 07/04/2013   DR NORRIS  . TOTAL SHOULDER ARTHROPLASTY Right 07/04/2013   Procedure: RIGHT TOTAL SHOULDER ARTHROPLASTY;  Surgeon:  Augustin Schooling, MD;  Location: [facility];  Service: Orthopedics;  Laterality: Right;  . TOTAL SHOULDER ARTHROPLASTY Left 03/12/2015   Procedure: LEFT SHOULDER TOTAL SHOULDER ARTHROPLASTY;  Surgeon: Netta Cedars, MD;  Location: [facility];  Service: Orthopedics;  Laterality: Left;    Current Outpatient Medications  Medication Sig Dispense Refill  . cholecalciferol (VITAMIN D) 1000 UNITS tablet Take 1,000 Units by mouth daily.    . citalopram (CELEXA) 40 MG tablet TAKE 1 TABLET BY MOUTH EVERY DAY 90 tablet 0  . docusate sodium (COLACE) 100 MG capsule Take 400 mg by mouth at bedtime.     . fluticasone (FLONASE) 50 MCG/ACT nasal spray 1-2 sprays each nostril once a day as needed 16 g 4  . levothyroxine (SYNTHROID, LEVOTHROID) 75 MCG tablet Take 1 tablet (75 mcg total) by mouth daily. 90 tablet 3  . loratadine (CLARITIN) 10 MG tablet Take 10 mg by mouth daily as needed for allergies.     . Multiple Vitamin (MULTIVITAMIN) tablet Take by mouth daily.    Marland Kitchen PROAIR HFA 108 (90 Base) MCG/ACT inhaler INHALE 2 PUFFS INTO THE LUNGS EVERY 6 (SIX) HOURS AS NEEDED FOR SHORTNESS OF BREATH. 8.5 Inhaler 0  . temazepam (RESTORIL) 30 MG capsule TAKE 1  CAPSULE BY MOUTH AT BEDTIME AS NEEDED 30 capsule 0  . vitamin B-12 (CYANOCOBALAMIN) 1000 MCG tablet Take 1,000 mcg by mouth daily.     No current facility-administered medications for this visit.     Family History  Problem Relation Age of Onset  . Breast cancer Sister 77       stage 4, chemo and radiation  . Esophageal cancer Sister   . Diabetes Brother   . Epilepsy Daughter   . Hypertension Mother   . Osteoporosis Mother   . Diabetes Father   . Hypertension Father   . Lung cancer Paternal Aunt     Review of Systems  All other systems reviewed and are negative.   Exam:   BP 126/80   Pulse 68   Temp (!) 97.3 F (36.3 C) (Temporal)   Ht 5' 3.25" (1.607 m)   Wt 156 lb 9.6 oz (71 kg)   LMP 04/11/2003 (Approximate)   BMI 27.52 kg/m    Height: 5' 3.25"  (160.7 cm)  Ht Readings from Last 3 Encounters:  10/03/18 5' 3.25" (1.607 m)  05/20/18 5\' 3"  (1.6 m)  05/04/18 5\' 3"  (1.6 m)    General appearance: alert, cooperative and appears stated age Head: Normocephalic, without obvious abnormality, atraumatic Neck: no adenopathy, supple, symmetrical, trachea midline and thyroid normal to inspection and palpation Lungs: clear to auscultation bilaterally Breasts: normal appearance, no masses or tenderness Heart: regular rate and rhythm Abdomen: soft, non-tender; bowel sounds normal; no masses,  no organomegaly Extremities: extremities normal, atraumatic, no cyanosis or edema Skin: Skin color, texture, turgor normal. No rashes or lesions Lymph nodes: Cervical, supraclavicular, and axillary nodes normal. No abnormal inguinal nodes palpated Neurologic: Grossly normal   Pelvic: External genitalia:  no lesions              Urethra:  normal appearing urethra with no masses, tenderness or lesions              Bartholins and Skenes: normal                 Vagina: normal appearing vagina with normal color and discharge, no lesions              Cervix: no lesions              Pap taken: Yes.   Bimanual Exam:  Uterus:  normal size, contour, position, consistency, mobility, non-tender              Adnexa: normal adnexa and no mass, fullness, tenderness               Rectovaginal: Confirms               Anus:  normal sphincter tone, no lesions  Chaperone was present for exam.  A:  Well Woman with normal exam PMP, no HRT Hypothyroidism H/O Sjogren's syndrome H/O depression Family hx of breast cancer in sister H/O elevated lipids  P:   Mammogram guidelines reviewd.  Doing 3D MMGs BMD ordered.  Pt will call to schedule this with her MMG. Colonoscopy due next year pap smear obtained today Lab work is being done now with Dr. Nolon Rod RF for Celexa 40mg  daily.  #90/4RF Vaccines UTD return annually or prn

## 2018-10-04 ENCOUNTER — Telehealth: Payer: Self-pay | Admitting: Family Medicine

## 2018-10-04 LAB — CYTOLOGY - PAP
Adequacy: ABSENT
Diagnosis: NEGATIVE

## 2018-10-04 NOTE — Telephone Encounter (Signed)
Called pt and lvmtcb and shcedule initial medicare well visit with Dr. Nolon Rod per the CRM

## 2018-10-04 NOTE — Telephone Encounter (Signed)
Pt called back about scheduling appt. Was told to send a CRM.

## 2018-10-09 NOTE — Telephone Encounter (Signed)
Pt called back requesting to schedule medicare well visit. Pt requests call back

## 2018-10-18 NOTE — Telephone Encounter (Signed)
Spoke to patient and made appnt

## 2018-10-21 ENCOUNTER — Telehealth: Payer: Self-pay | Admitting: Obstetrics & Gynecology

## 2018-10-21 NOTE — Telephone Encounter (Signed)
Patient requesting pap smear results. Reviewed with patient.

## 2018-10-21 NOTE — Telephone Encounter (Signed)
Patient calling for results. Ok to leave a detailed message if not available.

## 2018-10-23 ENCOUNTER — Encounter: Payer: Self-pay | Admitting: Family Medicine

## 2018-10-23 DIAGNOSIS — H04123 Dry eye syndrome of bilateral lacrimal glands: Secondary | ICD-10-CM | POA: Diagnosis not present

## 2018-10-23 DIAGNOSIS — H40013 Open angle with borderline findings, low risk, bilateral: Secondary | ICD-10-CM | POA: Diagnosis not present

## 2018-10-23 DIAGNOSIS — H1045 Other chronic allergic conjunctivitis: Secondary | ICD-10-CM | POA: Diagnosis not present

## 2018-10-23 DIAGNOSIS — H2513 Age-related nuclear cataract, bilateral: Secondary | ICD-10-CM | POA: Diagnosis not present

## 2018-11-05 ENCOUNTER — Other Ambulatory Visit: Payer: Self-pay

## 2018-11-05 ENCOUNTER — Ambulatory Visit
Admission: RE | Admit: 2018-11-05 | Discharge: 2018-11-05 | Disposition: A | Discharge status: Home / Self Care | Payer: Medicare HMO | Source: Ambulatory Visit | Attending: Physical Medicine and Rehabilitation | Admitting: Physical Medicine and Rehabilitation

## 2018-11-05 DIAGNOSIS — Z1231 Encounter for screening mammogram for malignant neoplasm of breast: Secondary | ICD-10-CM

## 2018-11-06 ENCOUNTER — Other Ambulatory Visit: Payer: Self-pay | Admitting: Obstetrics & Gynecology

## 2018-11-06 ENCOUNTER — Other Ambulatory Visit: Payer: Self-pay | Admitting: Physical Medicine and Rehabilitation

## 2018-11-06 DIAGNOSIS — R928 Other abnormal and inconclusive findings on diagnostic imaging of breast: Secondary | ICD-10-CM

## 2018-11-08 ENCOUNTER — Other Ambulatory Visit: Payer: Medicare HMO

## 2018-11-12 ENCOUNTER — Ambulatory Visit: Payer: Medicare HMO

## 2018-11-12 ENCOUNTER — Other Ambulatory Visit: Payer: Self-pay

## 2018-11-12 ENCOUNTER — Ambulatory Visit
Admission: RE | Admit: 2018-11-12 | Discharge: 2018-11-12 | Disposition: A | Discharge status: Home / Self Care | Payer: Medicare HMO | Source: Ambulatory Visit | Attending: Obstetrics & Gynecology | Admitting: Obstetrics & Gynecology

## 2018-11-12 DIAGNOSIS — R922 Inconclusive mammogram: Secondary | ICD-10-CM | POA: Diagnosis not present

## 2018-11-12 DIAGNOSIS — R928 Other abnormal and inconclusive findings on diagnostic imaging of breast: Secondary | ICD-10-CM

## 2018-11-13 ENCOUNTER — Other Ambulatory Visit: Payer: Self-pay

## 2018-11-13 ENCOUNTER — Ambulatory Visit (INDEPENDENT_AMBULATORY_CARE_PROVIDER_SITE_OTHER): Payer: Medicare HMO | Admitting: Family Medicine

## 2018-11-13 ENCOUNTER — Encounter: Payer: Self-pay | Admitting: Family Medicine

## 2018-11-13 VITALS — BP 125/87 | HR 70 | Temp 98.4°F | Resp 17 | Ht 63.25 in | Wt 159.8 lb

## 2018-11-13 DIAGNOSIS — E785 Hyperlipidemia, unspecified: Secondary | ICD-10-CM | POA: Diagnosis not present

## 2018-11-13 DIAGNOSIS — Z0001 Encounter for general adult medical examination with abnormal findings: Secondary | ICD-10-CM | POA: Diagnosis not present

## 2018-11-13 DIAGNOSIS — Z0000 Encounter for general adult medical examination without abnormal findings: Secondary | ICD-10-CM

## 2018-11-13 NOTE — Patient Instructions (Addendum)
   If you have lab work done today you will be contacted with your lab results within the next 2 weeks.  If you have not heard from us then please contact us. The fastest way to get your results is to register for My Chart.   IF you received an x-ray today, you will receive an invoice from [facility]. Please contact [facility] at [phone] with questions or concerns regarding your invoice.   IF you received labwork today, you will receive an invoice from [facility]. Please contact [facility] at [phone] with questions or concerns regarding your invoice.   Our billing staff will not be able to assist you with questions regarding bills from these companies.  You will be contacted with the lab results as soon as they are available. The fastest way to get your results is to activate your My Chart account. Instructions are located on the last page of this paperwork. If you have not heard from us regarding the results in 2 weeks, please contact this office.     Health Maintenance After Age 65 After age 65, you are at a higher risk for certain long-term diseases and infections as well as injuries from falls. Falls are a major cause of broken bones and head injuries in people who are older than age 65. Getting regular preventive care can help to keep you healthy and well. Preventive care includes getting regular testing and making lifestyle changes as recommended by your health care provider. Talk with your health care provider about:  Which screenings and tests you should have. A screening is a test that checks for a disease when you have no symptoms.  A diet and exercise plan that is right for you. What should I know about screenings and tests to prevent falls? Screening and testing are the best ways to find a health problem early. Early diagnosis and treatment give you the best chance of managing medical conditions that are common after age 65. Certain conditions and  lifestyle choices may make you more likely to have a fall. Your health care provider may recommend:  Regular vision checks. Poor vision and conditions such as cataracts can make you more likely to have a fall. If you wear glasses, make sure to get your prescription updated if your vision changes.  Medicine review. Work with your health care provider to regularly review all of the medicines you are taking, including over-the-counter medicines. Ask your health care provider about any side effects that may make you more likely to have a fall. Tell your health care provider if any medicines that you take make you feel dizzy or sleepy.  Osteoporosis screening. Osteoporosis is a condition that causes the bones to get weaker. This can make the bones weak and cause them to break more easily.  Blood pressure screening. Blood pressure changes and medicines to control blood pressure can make you feel dizzy.  Strength and balance checks. Your health care provider may recommend certain tests to check your strength and balance while standing, walking, or changing positions.  Foot health exam. Foot pain and numbness, as well as not wearing proper footwear, can make you more likely to have a fall.  Depression screening. You may be more likely to have a fall if you have a fear of falling, feel emotionally low, or feel unable to do activities that you used to do.  Alcohol use screening. Using too much alcohol can affect your balance and may make you more likely to   have a fall. What actions can I take to lower my risk of falls? General instructions  Talk with your health care provider about your risks for falling. Tell your health care provider if: ? You fall. Be sure to tell your health care provider about all falls, even ones that seem minor. ? You feel dizzy, sleepy, or off-balance.  Take over-the-counter and prescription medicines only as told by your health care provider. These include any  supplements.  Eat a healthy diet and maintain a healthy weight. A healthy diet includes low-fat dairy products, low-fat (lean) meats, and fiber from whole grains, beans, and lots of fruits and vegetables. Home safety  Remove any tripping hazards, such as rugs, cords, and clutter.  Install safety equipment such as grab bars in bathrooms and safety rails on stairs.  Keep rooms and walkways well-lit. Activity   Follow a regular exercise program to stay fit. This will help you maintain your balance. Ask your health care provider what types of exercise are appropriate for you.  If you need a cane or walker, use it as recommended by your health care provider.  Wear supportive shoes that have nonskid soles. Lifestyle  Do not drink alcohol if your health care provider tells you not to drink.  If you drink alcohol, limit how much you have: ? 0-1 drink a day for women. ? 0-2 drinks a day for men.  Be aware of how much alcohol is in your drink. In the [place] one drink equals one typical bottle of beer (12 oz), one-half glass of wine (5 oz), or one shot of hard liquor (1 oz).  Do not use any products that contain nicotine or tobacco, such as cigarettes and e-cigarettes. If you need help quitting, ask your health care provider. Summary  Having a healthy lifestyle and getting preventive care can help to protect your health and wellness after age 65.  Screening and testing are the best way to find a health problem early and help you avoid having a fall. Early diagnosis and treatment give you the best chance for managing medical conditions that are more common for people who are older than age 65.  Falls are a major cause of broken bones and head injuries in people who are older than age 65. Take precautions to prevent a fall at home.  Work with your health care provider to learn what changes you can make to improve your health and wellness and to prevent falls. This information is not intended  to replace advice given to you by your health care provider. Make sure you discuss any questions you have with your health care provider. Document Released: 02/07/2017 Document Revised: 07/18/2018 Document Reviewed: 02/07/2017 Elsevier Patient Education  2020 Elsevier Inc.  

## 2018-11-13 NOTE — Progress Notes (Addendum)
QUICK REFERENCE INFORMATION:  The ABCs of Providing the Initial Preventive Physical Examination CMS.gov Medicare Christine Valdez is a 68 y.o. female who presents for a Medicare exam.   Patient Active Problem List   Diagnosis Date Noted  . Influenza with respiratory manifestation other than pneumonia 05/05/2018  . Recurrent major depressive disorder, in partial remission ([place]) 09/28/2016  . Hypothyroidism 09/28/2016  . B12 deficiency 09/28/2016  . Hyperglycemia 09/28/2016  . Pruritus ani 06/25/2015  . 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 Anxiety 02/04/2015  . Major depression, recurrent ([place]) 02/04/2015  . Mild intermittent asthma without complication 59/29/2446  . Osteoarthrosis, unspecified whether generalized or localized, shoulder region 07/04/2013    Past Medical History:  Diagnosis Date  . Allergy    uses Flonase daily as needed and takes Loratadine daily   . Anemia    only after birth of 2nd child  . Arthritis   . Asthma    uses Albuterol daily as needed;exercise induced  . Cancer ([place]) 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, right cheek  . Depression    takes Celexa daily  . GERD (gastroesophageal reflux disease)    occ  . History of colon polyps   . History of shingles    5-6 yrs  . Hyperlipidemia   . Hypothyroid   . Insomnia    takes Restoril nightly as needed  . Interstitial cystitis   . Osteoarthritis   . Sjogren's syndrome ([place])   . Spinal headache    after 1st C/S, o2sat low after shoulder surgery after went to room(51%),hard to urinate)     Past Surgical History:  Procedure Laterality Date  . CARPAL TUNNEL RELEASE Right 05/2007  . CESAREAN SECTION  1981/1984   x 2  . COLONOSCOPY    . TONSILLECTOMY AND ADENOIDECTOMY  age 47  . TOTAL SHOULDER ARTHROPLASTY Right 07/04/2013   DR NORRIS  . TOTAL SHOULDER ARTHROPLASTY Right 07/04/2013   Procedure: RIGHT TOTAL SHOULDER ARTHROPLASTY;  Surgeon: Augustin Schooling, MD;  Location: [facility];  Service:  Orthopedics;  Laterality: Right;  . TOTAL SHOULDER ARTHROPLASTY Left 03/12/2015   Procedure: LEFT SHOULDER TOTAL SHOULDER ARTHROPLASTY;  Surgeon: Netta Cedars, MD;  Location: [facility];  Service: Orthopedics;  Laterality: Left;     Outpatient Medications Prior to Visit  Medication Sig Dispense Refill  . cholecalciferol (VITAMIN D) 1000 UNITS tablet Take 1,000 Units by mouth daily.    . citalopram (CELEXA) 40 MG tablet Take 1 tablet (40 mg total) by mouth daily. 90 tablet 4  . docusate sodium (COLACE) 100 MG capsule Take 400 mg by mouth at bedtime.     . fluticasone (FLONASE) 50 MCG/ACT nasal spray 1-2 sprays each nostril once a day as needed 16 g 4  . levothyroxine (SYNTHROID, LEVOTHROID) 75 MCG tablet Take 1 tablet (75 mcg total) by mouth daily. 90 tablet 3  . loratadine (CLARITIN) 10 MG tablet Take 10 mg by mouth daily as needed for allergies.     . Multiple Vitamin (MULTIVITAMIN) tablet Take by mouth daily.    Marland Kitchen PROAIR HFA 108 (90 Base) MCG/ACT inhaler INHALE 2 PUFFS INTO THE LUNGS EVERY 6 (SIX) HOURS AS NEEDED FOR SHORTNESS OF BREATH. 8.5 Inhaler 0  . temazepam (RESTORIL) 30 MG capsule TAKE 1 CAPSULE BY MOUTH AT BEDTIME AS NEEDED 30 capsule 0  . vitamin B-12 (CYANOCOBALAMIN) 1000 MCG tablet Take 1,000 mcg by mouth daily.     No facility-administered medications prior to visit.  Allergies  Allergen Reactions  . Diflucan [Fluconazole] Hives    unknown  . Thimerosal Hives  . Mercury Detox [Nutritional Supplements] Hives  . Influenza Vaccine Recombinant Rash    Mercury component in flu vaccine  . Levaquin [Levofloxacin] Rash  . Sulfa Antibiotics Rash     Family History  Problem Relation Age of Onset  . Breast cancer Sister 13       stage 4, chemo and radiation  . Esophageal cancer Sister   . Diabetes Brother   . Epilepsy Daughter   . Hypertension Mother   . Osteoporosis Mother   . Diabetes Father   . Hypertension Father   . Lung cancer Paternal Aunt      Social History    Socioeconomic History  . Marital status: Married    Spouse name: Not on file  . Number of children: 2  . Years of education: Not on file  . Highest education level: Not on file  Occupational History  . Not on file  Social Needs  . Financial resource strain: Not on file  . Food insecurity    Worry: Not on file    Inability: Not on file  . Transportation needs    Medical: Not on file    Non-medical: Not on file  Tobacco Use  . Smoking status: Never Smoker  . Smokeless tobacco: Never Used  Substance and Sexual Activity  . Alcohol use: Yes    Alcohol/week: 2.0 standard drinks    Types: 2 Glasses of wine per week  . Drug use: No  . Sexual activity: Not Currently    Partners: Male    Birth control/protection: Post-menopausal  Lifestyle  . Physical activity    Days per week: Not on file    Minutes per session: Not on file  . Stress: Not on file  Relationships  . Social Herbalist on phone: Not on file    Gets together: Not on file    Attends religious service: Not on file    Active member of club or organization: Not on file    Attends meetings of clubs or organizations: Not on file    Relationship status: Not on file  . Intimate partner violence    Fear of current or ex partner: Not on file    Emotionally abused: Not on file    Physically abused: Not on file    Forced sexual activity: Not on file  Other Topics Concern  . Not on file  Social History Narrative   Work or School: nanny for multiple families      Home Situation: son a Theme park manager      Spiritual Beliefs: Christian      Lifestyle: no regular exercise, diet not great     ROS  Review of Systems  Constitutional: Negative for activity change, appetite change, chills and fever.  HENT: Negative for congestion, nosebleeds, trouble swallowing and voice change.   Respiratory: Negative for cough, shortness of breath and wheezing.   Gastrointestinal: Negative for diarrhea, nausea and vomiting.   Genitourinary: Negative for difficulty urinating, dysuria, flank pain and hematuria.  Musculoskeletal: Negative for back pain, joint swelling and neck pain.  Neurological: Negative for dizziness, speech difficulty, light-headedness and numbness.  See HPI. All other review of systems negative.   Recent Hospitalizations? no  Current Medical Providers and Suppliers: [facility] Patient Care Team: Forrest Moron, MD as PCP - General (Internal Medicine) Future Appointments  Date Time Provider [place]  12/23/2018  4:00 PM GI-BCG DX DEXA 1 GI-BCGDG GI-BREAST CE  12/09/2019  1:00 PM Megan Salon, MD [place] None     Age-appropriate Screening Schedule: The list below includes current immunization status and future screening recommendations based on patient's age. Orders for these recommended tests are listed in the plan section. The patient has been provided with a written plan. Immunization History  Administered Date(s) Administered  . Pneumococcal Conjugate-13 02/02/2018  . Pneumococcal Polysaccharide-23 04/21/2014  . Tdap 06/24/2013  . Zoster Recombinat (Shingrix) 07/23/2017, 09/21/2017   Health Maintenance  Topic Date Due  . TETANUS/TDAP  09/12/2025 (Originally 06/25/2023)  . INFLUENZA VACCINE  05/22/2026 (Originally 11/09/2018)  . PNA vac Low Risk Adult (2 of 2 - PPSV23) 04/22/2019  . MAMMOGRAM  11/04/2020  . COLONOSCOPY  08/18/2024  . DEXA SCAN  Completed  . Hepatitis C Screening  Completed    Health Habits  Exercise: 7 times/week Current exercise activities include: walking her dogs twice a day Diet: in general, a "healthy" diet    Alcohol: social  Depression Screen-PHQ2/9 completed today PHQ-2 Depression screen [facility] 2/9 11/13/2018 05/20/2018 05/04/2018 02/02/2018 12/27/2017  Decreased Interest 0 0 0 0 0  Down, Depressed, Hopeless 0 0 0 0 0  PHQ - 2 Score 0 0 0 0 0    Depression Severity and Treatment Recommendations:  0-4= None  5-9= Mild / Treatment: Support,  educate to call if worse; return in one month  10-14= Moderate / Treatment: Support, watchful waiting; Antidepressant or Psycotherapy  15-19= Moderately severe / Treatment: Antidepressant OR Psychotherapy  >= 20 = Major depression, severe / Antidepressant AND Psychotherapy  Functional Status Survey: Is the patient deaf or have difficulty hearing?: No Does the patient have difficulty seeing, even when wearing glasses/contacts?: No Does the patient have difficulty concentrating, remembering, or making decisions?: No Does the patient have difficulty walking or climbing stairs?: No Does the patient have difficulty dressing or bathing?: No Does the patient have difficulty doing errands alone such as visiting a doctor's office or shopping?: No  Safety Screen  Does the home have:  Rugs in the hallway: no Stairs in home: no Handrails on the stairs: n/a Poor lighting: no   Hearing Evaluation  Do you have trouble hearing the television when others do not? no  Do you have to strain to hear/understand conversations? no   Falls Risk:  Does the patient need assistance with ambulation? no  Does the patient have a history of a fall in the last 90 days? no Is the patient at risk for falls? no Was the patient's timed "Get Up and Go Test" unsteady or longer than 30 seconds? no   Advanced Care Planning Patient has executed an Advance Directive: yes  If no, patient was given the opportunity to execute an Advance Directive today? n/a  Are the patient's advanced directives in [place]? no  This patient has the ability to prepare an Advance Directive: no Provider is willing to follow the patient's wishes: yes   Cognitive Assessment Does the patient have evidence of cognitive impairment? no The patient does not have evidence of a change in mood/affect, appearance,  speech, memory or motor skills.   Objective:   Vitals:   11/13/18 0955  BP: 125/87  Pulse: 70  Resp: 17  Temp: 98.4 F (36.9 C)   TempSrc: Oral  SpO2: 96%  Weight: 159 lb 12.8 oz (72.5 kg)  Height: 5' 3.25" (1.607 m)    Body mass index is 28.08 kg/m.  Physical Exam  Constitutional: Oriented to person, place, and time. Appears well-developed and well-nourished.  HENT:  Head: Normocephalic and atraumatic.  Eyes: Conjunctivae and EOM are normal.  Cardiovascular: Normal rate, regular rhythm, normal heart sounds and intact distal pulses.  No murmur heard. Pulmonary/Chest: Effort normal and breath sounds normal. No stridor. No respiratory distress. Has no wheezes.  Neurological: Is alert and oriented to person, place, and time.  Skin: Skin is warm. Capillary refill takes less than 2 seconds.  Psychiatric: Has a normal mood and affect. Behavior is normal. Judgment and thought content normal.   Hearing/Vision exam:  Hearing Screening   125Hz  250Hz  500Hz  1000Hz  2000Hz  3000Hz  4000Hz  6000Hz  8000Hz   Right ear:           Left ear:             Visual Acuity Screening   Right eye Left eye Both eyes  Without correction:     With correction: 20/25 20/25 20/20      Assessment:  AWV    Plan:  During the course of the visit the patient was educated and counseled about appropriate screening and preventive services including:  Discussion of mammogram and pap smear  Reviewing screening colonoscopy - due next year Discussed exercise to improve cardiovascular health Bone Density - scheduled for September 2020  Discussed the patient's BMI with her. The BMI BMI is not in the acceptable range; BMI management plan is completed  MEDICARE -  ADVISED TO FOLLOW UP WITH MEDICARE EXAM IN ONE YEAR -  EXERCISE TO LOSE WEIGHT -  FOLLOW UP WITH BONE DENSITY, MAMMOGRAM, COLONOSCOPY     The following orders were placed at today's visit;  Orders Placed This Encounter  Procedures  . Lipid panel  . CMP14+EGFR  . EKG 12-Lead     No follow-ups on file.  Future Appointments  Date Time Provider [place]  12/23/2018   4:00 PM GI-BCG DX DEXA 1 GI-BCGDG GI-BREAST CE  12/09/2019  1:00 PM Megan Salon, MD [place] None    Patient Instructions       If you have lab work done today you will be contacted with your lab results within the next 2 weeks.  If you have not heard from [place] then please contact us. The fastest way to get your results is to register for My Chart.   IF you received an x-ray today, you will receive an invoice from [facility]. Please contact [facility] at [phone] with questions or concerns regarding your invoice.   IF you received labwork today, you will receive an invoice from [facility]. Please contact [facility] at [phone] with questions or concerns regarding your invoice.   Our billing staff will not be able to assist you with questions regarding bills from these companies.  You will be contacted with the lab results as soon as they are available. The fastest way to get your results is to activate your My Chart account. Instructions are located on the last page of this paperwork. If you have not heard from [place] regarding the results in 2 weeks, please contact this office.       An after visit summary with all of these plans was given to the patient.

## 2018-11-14 LAB — CMP14+EGFR
ALT: 18 IU/L (ref 0–32)
AST: 23 IU/L (ref 0–40)
Albumin/Globulin Ratio: 2.1 (ref 1.2–2.2)
Albumin: 4.4 g/dL (ref 3.8–4.8)
Alkaline Phosphatase: 46 IU/L (ref 39–117)
BUN/Creatinine Ratio: 17 (ref 12–28)
BUN: 16 mg/dL (ref 8–27)
Bilirubin Total: 0.4 mg/dL (ref 0.0–1.2)
CO2: 22 mmol/L (ref 20–29)
Calcium: 9.6 mg/dL (ref 8.7–10.3)
Chloride: 105 mmol/L (ref 96–106)
Creatinine, Ser: 0.95 mg/dL (ref 0.57–1.00)
GFR calc Af Amer: 72 mL/min/{1.73_m2} (ref >59)
GFR calc non Af Amer: 63 mL/min/{1.73_m2} (ref >59)
Globulin, Total: 2.1 g/dL (ref 1.5–4.5)
Glucose: 92 mg/dL (ref 65–99)
Potassium: 4.3 mmol/L (ref 3.5–5.2)
Sodium: 141 mmol/L (ref 134–144)
Total Protein: 6.5 g/dL (ref 6.0–8.5)

## 2018-11-14 LAB — LIPID PANEL
Chol/HDL Ratio: 3 ratio (ref 0.0–4.4)
Cholesterol, Total: 217 mg/dL — ABNORMAL HIGH (ref 100–199)
HDL: 73 mg/dL (ref >39)
LDL Calculated: 132 mg/dL — ABNORMAL HIGH (ref 0–99)
Triglycerides: 62 mg/dL (ref 0–149)
VLDL Cholesterol Cal: 12 mg/dL (ref 5–40)

## 2018-11-21 ENCOUNTER — Other Ambulatory Visit: Payer: Self-pay | Admitting: Obstetrics & Gynecology

## 2018-11-22 NOTE — Telephone Encounter (Signed)
Medication refill request: restoril 30mg  Last AEX:10/03/18   Next AEX: 12/09/19 Last MMG (if hormonal medication request): 11/12/18 Bi-rads 1 neg  Refill authorized: medication pended #30 with 1 RF please advise.

## 2018-12-10 ENCOUNTER — Other Ambulatory Visit: Payer: BLUE CROSS/BLUE SHIELD

## 2018-12-18 NOTE — Addendum Note (Signed)
Addended by: Delia Chimes A on: 12/18/2018 10:15 PM   Modules accepted: Level of Service

## 2018-12-22 NOTE — Progress Notes (Signed)
Imp/Plan-Eye Exam  1. Dematochalasis Bilateral UL. Observe 2. Borderline Glaucoma, Open Angle with borderline findings OU.  Based on nerves /hx of borderline IOP.  FDT VF is full. 3. Other chronic allergic conunctivitis OU.  Gave [employer] coupon. 4. Nuclear scleroisi OU.  Observe. Refraction done-Rx not given. 5. DES OU. Tears PRN, Gave sample of PF tears.  Discussed punctual plugs, can call and set up anytime, if she is still having trouble. Follow up: Dr. Katy Fitch 1 year-full exam FDT VF/OCT-N.

## 2018-12-23 ENCOUNTER — Ambulatory Visit
Admission: RE | Admit: 2018-12-23 | Discharge: 2018-12-23 | Disposition: A | Discharge status: Home / Self Care | Payer: BLUE CROSS/BLUE SHIELD | Source: Ambulatory Visit | Attending: Obstetrics & Gynecology | Admitting: Obstetrics & Gynecology

## 2018-12-23 ENCOUNTER — Other Ambulatory Visit: Payer: Self-pay

## 2018-12-23 DIAGNOSIS — Z780 Asymptomatic menopausal state: Secondary | ICD-10-CM | POA: Diagnosis not present

## 2018-12-23 DIAGNOSIS — E2839 Other primary ovarian failure: Secondary | ICD-10-CM

## 2018-12-23 DIAGNOSIS — M8589 Other specified disorders of bone density and structure, multiple sites: Secondary | ICD-10-CM | POA: Diagnosis not present

## 2019-01-03 DIAGNOSIS — R69 Illness, unspecified: Secondary | ICD-10-CM | POA: Diagnosis not present

## 2019-01-06 ENCOUNTER — Other Ambulatory Visit: Payer: Self-pay | Admitting: Family Medicine

## 2019-01-06 DIAGNOSIS — E039 Hypothyroidism, unspecified: Secondary | ICD-10-CM

## 2019-01-06 NOTE — Telephone Encounter (Signed)
Forwarding medication refill request to the clinical pool for review. 

## 2019-02-26 DIAGNOSIS — R69 Illness, unspecified: Secondary | ICD-10-CM | POA: Diagnosis not present

## 2019-03-20 DIAGNOSIS — R69 Illness, unspecified: Secondary | ICD-10-CM | POA: Diagnosis not present

## 2019-04-13 DIAGNOSIS — R5383 Other fatigue: Secondary | ICD-10-CM | POA: Diagnosis not present

## 2019-04-13 DIAGNOSIS — R519 Headache, unspecified: Secondary | ICD-10-CM | POA: Diagnosis not present

## 2019-04-13 DIAGNOSIS — Z20828 Contact with and (suspected) exposure to other viral communicable diseases: Secondary | ICD-10-CM | POA: Diagnosis not present

## 2019-04-13 DIAGNOSIS — R430 Anosmia: Secondary | ICD-10-CM | POA: Diagnosis not present

## 2019-04-29 ENCOUNTER — Other Ambulatory Visit: Payer: Medicare HMO

## 2019-04-29 ENCOUNTER — Ambulatory Visit: Payer: Medicare Other | Attending: Internal Medicine

## 2019-04-29 DIAGNOSIS — Z23 Encounter for immunization: Secondary | ICD-10-CM | POA: Insufficient documentation

## 2019-04-29 NOTE — Progress Notes (Signed)
   Covid-19 Vaccination Clinic  Name:  Christine Valdez    MRN: [id number] DOB: [birth year]  04/29/2019  Christine Valdez was observed post Covid-19 immunization for 30 minutes based on pre-vaccination screening without incidence. She was provided with Vaccine Information Sheet and instruction to access the V-Safe system.   Christine Valdez was instructed to call 911 with any severe reactions post vaccine: Marland Kitchen Difficulty breathing  . Swelling of your face and throat  . A fast heartbeat  . A bad rash all over your body  . Dizziness and weakness    Immunizations Administered    Name Date Dose VIS Date Route   Pfizer COVID-19 Vaccine 04/29/2019 10:31 AM 0.3 mL 03/21/2019 Intramuscular   Manufacturer: [employer], [employer]   Lot: [id number]   [place]: [id number]

## 2019-05-03 ENCOUNTER — Encounter (INDEPENDENT_AMBULATORY_CARE_PROVIDER_SITE_OTHER): Payer: Medicare HMO | Admitting: Ophthalmology

## 2019-05-03 DIAGNOSIS — H538 Other visual disturbances: Secondary | ICD-10-CM

## 2019-05-03 DIAGNOSIS — S0501XA Injury of conjunctiva and corneal abrasion without foreign body, right eye, initial encounter: Secondary | ICD-10-CM

## 2019-05-03 DIAGNOSIS — H5711 Ocular pain, right eye: Secondary | ICD-10-CM

## 2019-05-05 DIAGNOSIS — S0501XA Injury of conjunctiva and corneal abrasion without foreign body, right eye, initial encounter: Secondary | ICD-10-CM | POA: Diagnosis not present

## 2019-05-05 DIAGNOSIS — H04123 Dry eye syndrome of bilateral lacrimal glands: Secondary | ICD-10-CM | POA: Diagnosis not present

## 2019-05-07 ENCOUNTER — Encounter: Payer: Self-pay | Admitting: Family Medicine

## 2019-05-18 ENCOUNTER — Ambulatory Visit: Payer: Medicare HMO | Attending: Internal Medicine

## 2019-05-18 DIAGNOSIS — Z23 Encounter for immunization: Secondary | ICD-10-CM | POA: Insufficient documentation

## 2019-05-18 NOTE — Progress Notes (Signed)
   Covid-19 Vaccination Clinic  Name:  Christine Valdez    MRN: [id number] DOB: 28-Aug-1950  05/18/2019  [name] was observed post Covid-19 immunization for 30 minutes based on pre-vaccination screening without incidence. She was provided with Vaccine Information Sheet and instruction to access the V-Safe system.   [name] was instructed to call 911 with any severe reactions post vaccine: Marland Kitchen Difficulty breathing  . Swelling of your face and throat  . A fast heartbeat  . A bad rash all over your body  . Dizziness and weakness    Immunizations Administered    Name Date Dose VIS Date Route   Pfizer COVID-19 Vaccine 05/18/2019  1:51 PM 0.3 mL 03/21/2019 Intramuscular   Manufacturer: [place]   Lot: [id number]   [place]: [id number]

## 2019-05-19 ENCOUNTER — Telehealth: Payer: Self-pay | Admitting: Family Medicine

## 2019-05-19 NOTE — Telephone Encounter (Signed)
Pt calling back to  Inform that she is responding well to her covid vaccine

## 2019-06-05 ENCOUNTER — Other Ambulatory Visit: Payer: Self-pay | Admitting: Family Medicine

## 2019-06-05 NOTE — Telephone Encounter (Signed)
Requested Prescriptions  Pending Prescriptions Disp Refills  . fluticasone (FLONASE) 50 MCG/ACT nasal spray [Pharmacy Med Name: FLUTICASONE PROP 50 MCG SPRAY] 48 mL 1    Sig: USE 1-2 SPRAYS INTO EACH NOSTRIL ONCE A DAY AS NEEDED     Ear, Nose, and Throat: Nasal Preparations - Corticosteroids Passed - 06/05/2019  2:05 PM      Passed - Valid encounter within last 12 months    Recent Outpatient Visits          6 months ago Medicare annual wellness visit, subsequent   Primary Care at [facility], Arlie Solomons, MD   1 year ago Acute non-recurrent maxillary sinusitis   [facility] at [facility], Lilia Argue, MD   1 year ago Flu-like symptoms   Primary Care at [facility], Ellie Lunch, [place]   1 year ago Acquired hypothyroidism   Primary Care at [facility], Arlie Solomons, MD   1 year ago Sore throat   Primary Care at [facility], Arlie Solomons, MD

## 2019-07-03 ENCOUNTER — Other Ambulatory Visit: Payer: Self-pay | Admitting: Obstetrics & Gynecology

## 2019-07-04 NOTE — Telephone Encounter (Signed)
Medication refill request: restoril  Last AEX:  10/03/18 Next AEX: 12/09/19 Last MMG (if hormonal medication request): NA Refill authorized: #30 with 0 RF

## 2019-08-20 ENCOUNTER — Ambulatory Visit (INDEPENDENT_AMBULATORY_CARE_PROVIDER_SITE_OTHER): Payer: Medicare HMO | Admitting: Registered Nurse

## 2019-08-20 ENCOUNTER — Encounter: Payer: Self-pay | Admitting: Registered Nurse

## 2019-08-20 ENCOUNTER — Other Ambulatory Visit: Payer: Self-pay

## 2019-08-20 VITALS — BP 152/83 | HR 86 | Temp 98.0°F | Resp 16 | Ht 63.0 in | Wt 165.6 lb

## 2019-08-20 DIAGNOSIS — N390 Urinary tract infection, site not specified: Secondary | ICD-10-CM

## 2019-08-20 DIAGNOSIS — R0981 Nasal congestion: Secondary | ICD-10-CM

## 2019-08-20 LAB — POCT URINALYSIS DIP (MANUAL ENTRY)
Bilirubin, UA: NEGATIVE
Blood, UA: NEGATIVE
Glucose, UA: NEGATIVE mg/dL
Ketones, POC UA: NEGATIVE mg/dL
Leukocytes, UA: NEGATIVE
Nitrite, UA: POSITIVE — AB
Protein Ur, POC: NEGATIVE mg/dL
Spec Grav, UA: 1.02 (ref 1.010–1.025)
Urobilinogen, UA: 0.2 E.U./dL
pH, UA: 7 (ref 5.0–8.0)

## 2019-08-20 MED ORDER — NITROFURANTOIN MONOHYD MACRO 100 MG PO CAPS: 100.0000 mg | ORAL_CAPSULE | Freq: Two times a day (BID) | ORAL | 0 refills | Status: AC

## 2019-08-20 MED ORDER — GUAIFENESIN-DM 100-10 MG/5ML PO SYRP: 5.0000 mL | ORAL_SOLUTION | ORAL | 0 refills | Status: DC | PRN

## 2019-08-20 MED ORDER — AZELASTINE HCL 0.1 % NA SOLN: 2.0000 | Freq: Two times a day (BID) | NASAL | 12 refills | Status: DC

## 2019-08-20 NOTE — Patient Instructions (Signed)
° ° ° °  If you have lab work done today you will be contacted with your lab results within the next 2 weeks.  If you have not heard from us then please contact us. The fastest way to get your results is to register for My Chart. ° ° °IF you received an x-ray today, you will receive an invoice from [facility]. Please contact [facility] at [phone] with questions or concerns regarding your invoice.  ° °IF you received labwork today, you will receive an invoice from [facility]. Please contact [facility] at [phone] with questions or concerns regarding your invoice.  ° °Our billing staff will not be able to assist you with questions regarding bills from these companies. ° °You will be contacted with the lab results as soon as they are available. The fastest way to get your results is to activate your My Chart account. Instructions are located on the last page of this paperwork. If you have not heard from us regarding the results in 2 weeks, please contact this office. °  ° ° ° °

## 2019-08-20 NOTE — Progress Notes (Signed)
Acute Office Visit  Subjective:    Patient ID: Christine Valdez, female    DOB: 1950-06-04, 69 y.o.   MRN: [id number]  Chief Complaint  Patient presents with  . Urinary Tract Infection    patient states she thinks she has a UTI since sunday because she has had some Frequent urination and Pressure. Also sinus issues from Headache , Sore Throat but has been taking OTC medications    HPI Patient is in today for two concerns:  UTI: started Sunday with pressure, frequency, now having some lower back discomfort. Has had hx of interstitial cystitis that has not been a problem in some time, but is familiar with UTI and this feels like one.  Sinus pressure: ongoing. Hx of seasonal allergies - takes claritin and flonase with some effect, but has been worse lately. Concerned for sinus infection. Hx of nasal polyps and predisposition to sinusitis.   Otherwise, feeling well overall. No sick contacts. Vaccinated against covid.   Past Medical History:  Diagnosis Date  . Allergy    uses Flonase daily as needed and takes Loratadine daily   . Anemia    only after birth of 2nd child  . Arthritis   . Asthma    uses Albuterol daily as needed;exercise induced  . 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 Depression    takes Celexa daily  . GERD (gastroesophageal reflux disease)    occ  . History of colon polyps   . History of shingles    5-6 yrs  . Hyperlipidemia   . Hypothyroid   . Insomnia    takes Restoril nightly as needed  . Interstitial cystitis   . Osteoarthritis   . Sjogren's syndrome ([place])   . Spinal headache    after 1st C/S, o2sat low after shoulder surgery after went to room(51%),hard to urinate)    Past Surgical History:  Procedure Laterality Date  . CARPAL TUNNEL RELEASE Right 05/2007  . CESAREAN SECTION  1981/1984   x 2  . COLONOSCOPY    . TONSILLECTOMY AND ADENOIDECTOMY  age 46  . TOTAL SHOULDER ARTHROPLASTY Right 07/04/2013   DR NORRIS  . TOTAL  SHOULDER ARTHROPLASTY Right 07/04/2013   Procedure: RIGHT TOTAL SHOULDER ARTHROPLASTY;  Surgeon: Augustin Schooling, MD;  Location: [facility];  Service: Orthopedics;  Laterality: Right;  . TOTAL SHOULDER ARTHROPLASTY Left 03/12/2015   Procedure: LEFT SHOULDER TOTAL SHOULDER ARTHROPLASTY;  Surgeon: Netta Cedars, MD;  Location: [facility];  Service: Orthopedics;  Laterality: Left;    Family History  Problem Relation Age of Onset  . Breast cancer Sister 34       stage 4, chemo and radiation  . Esophageal cancer Sister   . Diabetes Brother   . Epilepsy Daughter   . Hypertension Mother   . Osteoporosis Mother   . Diabetes Father   . Hypertension Father   . Lung cancer Paternal Aunt     Social History   Socioeconomic History  . Marital status: Married    Spouse name: Not on file  . Number of children: 2  . Years of education: Not on file  . Highest education level: Not on file  Occupational History  . Not on file  Tobacco Use  . Smoking status: Never Smoker  . Smokeless tobacco: Never Used  Substance and Sexual Activity  . Alcohol use: Yes    Alcohol/week: 2.0 standard drinks    Types: 2 Glasses of wine per week  .  Drug use: No  . Sexual activity: Not Currently    Partners: Male    Birth control/protection: Post-menopausal  Other Topics Concern  . Not on file  Social History Narrative   Work or School: nanny for multiple families      Home Situation: son a Theme park manager      Spiritual Beliefs: Christian      Lifestyle: no regular exercise, diet not great   Social Determinants of Health   Financial Resource Strain:   . Difficulty of Paying Living Expenses:   Food Insecurity:   . Worried About Charity fundraiser in the Last Year:   . Arboriculturist in the Last Year:   Transportation Needs:   . Film/video editor (Medical):   Marland Kitchen Lack of Transportation (Non-Medical):   Physical Activity:   . Days of Exercise per Week:   . Minutes of Exercise per Session:   Stress:   . Feeling  of Stress :   Social Connections:   . Frequency of Communication with Friends and Family:   . Frequency of Social Gatherings with Friends and Family:   . Attends Religious Services:   . Active Member of Clubs or Organizations:   . Attends Archivist Meetings:   Marland Kitchen Marital Status:   Intimate Partner Violence:   . Fear of Current or Ex-Partner:   . Emotionally Abused:   Marland Kitchen Physically Abused:   . Sexually Abused:     Outpatient Medications Prior to Visit  Medication Sig Dispense Refill  . cholecalciferol (VITAMIN D) 1000 UNITS tablet Take 1,000 Units by mouth daily.    . citalopram (CELEXA) 40 MG tablet Take 1 tablet (40 mg total) by mouth daily. 90 tablet 4  . docusate sodium (COLACE) 100 MG capsule Take 400 mg by mouth at bedtime.     . fluticasone (FLONASE) 50 MCG/ACT nasal spray USE 1-2 SPRAYS INTO EACH NOSTRIL ONCE A DAY AS NEEDED 48 mL 1  . levothyroxine (SYNTHROID) 75 MCG tablet TAKE 1 TABLET BY MOUTH EVERY DAY 90 tablet 3  . loratadine (CLARITIN) 10 MG tablet Take 10 mg by mouth daily as needed for allergies.     . Multiple Vitamin (MULTIVITAMIN) tablet Take by mouth daily.    Marland Kitchen PROAIR HFA 108 (90 Base) MCG/ACT inhaler INHALE 2 PUFFS INTO THE LUNGS EVERY 6 (SIX) HOURS AS NEEDED FOR SHORTNESS OF BREATH. 8.5 Inhaler 0  . temazepam (RESTORIL) 30 MG capsule TAKE 1 CAPSULE BY MOUTH EVERY DAY AT BEDTIME AS NEEDED 30 capsule 0  . vitamin B-12 (CYANOCOBALAMIN) 1000 MCG tablet Take 1,000 mcg by mouth daily.     No facility-administered medications prior to visit.    Allergies  Allergen Reactions  . Diflucan [Fluconazole] Hives    unknown  . Thimerosal Hives  . Mercury Detox [Nutritional Supplements] Hives  . Influenza Vaccine Recombinant Rash    Mercury component in flu vaccine  . Levaquin [Levofloxacin] Rash  . Sulfa Antibiotics Rash    Review of Systems Per hpi, otherwise negative on 10pt ros    Objective:    Physical Exam Vitals and nursing note reviewed.    Constitutional:      General: She is not in acute distress.    Appearance: Normal appearance. She is normal weight. She is not ill-appearing, toxic-appearing or diaphoretic.  HENT:     Head: Normocephalic and atraumatic.     Right Ear: Tympanic membrane normal.     Left Ear: Tympanic membrane normal.  Nose: Congestion present. No rhinorrhea.     Right Sinus: Frontal sinus tenderness present.     Left Sinus: Frontal sinus tenderness present.     Mouth/Throat:     Mouth: Mucous membranes are moist.     Pharynx: Oropharynx is clear. No oropharyngeal exudate or posterior oropharyngeal erythema.  Neurological:     Mental Status: She is alert.     BP (!) 152/83   Pulse 86   Temp 98 F (36.7 C) (Temporal)   Resp 16   Ht 5\' 3"  (1.6 m)   Wt 165 lb 9.6 oz (75.1 kg)   LMP 04/11/2003 (Approximate)   SpO2 97%   BMI 29.33 kg/m  Wt Readings from Last 3 Encounters:  08/20/19 165 lb 9.6 oz (75.1 kg)  11/13/18 159 lb 12.8 oz (72.5 kg)  10/03/18 156 lb 9.6 oz (71 kg)    Health Maintenance Due  Topic Date Due  . PNA vac Low Risk Adult (2 of 2 - PPSV23) 04/22/2019    There are no preventive care reminders to display for this patient.   Lab Results  Component Value Date   TSH 0.996 02/02/2018   Lab Results  Component Value Date   WBC 3.7 (A) 04/30/2017   HGB 12.8 04/30/2017   HCT 37.8 04/30/2017   MCV 87.6 04/30/2017   PLT 179.0 05/22/2016   Lab Results  Component Value Date   NA 141 11/13/2018   K 4.3 11/13/2018   CO2 22 11/13/2018   GLUCOSE 92 11/13/2018   BUN 16 11/13/2018   CREATININE 0.95 11/13/2018   BILITOT 0.4 11/13/2018   ALKPHOS 46 11/13/2018   AST 23 11/13/2018   ALT 18 11/13/2018   PROT 6.5 11/13/2018   ALBUMIN 4.4 11/13/2018   CALCIUM 9.6 11/13/2018   ANIONGAP 9 03/13/2015   GFR 72.65 05/22/2016   Lab Results  Component Value Date   CHOL 217 (H) 11/13/2018   Lab Results  Component Value Date   HDL 73 11/13/2018   Lab Results  Component  Value Date   LDLCALC 132 (H) 11/13/2018   Lab Results  Component Value Date   TRIG 62 11/13/2018   Lab Results  Component Value Date   CHOLHDL 3.0 11/13/2018   Lab Results  Component Value Date   HGBA1C 5.8 04/23/2017       Assessment & Plan:   Problem List Items Addressed This Visit    None    Visit Diagnoses    Urinary tract infection without hematuria, site unspecified    -  Primary   Relevant Medications   nitrofurantoin, macrocrystal-monohydrate, (MACROBID) 100 MG capsule   Other Relevant Orders   POCT urinalysis dipstick (Completed)   Urine Culture   Sinus congestion       Relevant Medications   azelastine (ASTELIN) 0.1 % nasal spray   guaiFENesin-dextromethorphan (ROBITUSSIN DM) 100-10 MG/5ML syrup       Meds ordered this encounter  Medications  . nitrofurantoin, macrocrystal-monohydrate, (MACROBID) 100 MG capsule    Sig: Take 1 capsule (100 mg total) by mouth 2 (two) times daily for 5 days.    Dispense:  10 capsule    Refill:  0    Order Specific Question:   Supervising Provider    Answer:   Delia Chimes A [id number]  . azelastine (ASTELIN) 0.1 % nasal spray    Sig: Place 2 sprays into both nostrils 2 (two) times daily. Use in each nostril as directed    Dispense:  30  mL    Refill:  12    Order Specific Question:   Supervising Provider    Answer:   Delia Chimes A [id number]  . guaiFENesin-dextromethorphan (ROBITUSSIN DM) 100-10 MG/5ML syrup    Sig: Take 5 mLs by mouth every 4 (four) hours as needed for cough.    Dispense:  118 mL    Refill:  0    Order Specific Question:   Supervising Provider    Answer:   Forrest Moron [id number]   PLAN  Macrobid 100mg  Po bid for five days  Azelastine and guiafenesin-dextromethorphan for sinus relief  If sinus symptoms are ongoing, call clinic and we can discuss abx  Culture sent  Patient encouraged to call clinic with any questions, comments, or concerns.   Maximiano Coss, NP

## 2019-08-21 LAB — URINE CULTURE

## 2019-09-23 ENCOUNTER — Telehealth: Payer: Self-pay | Admitting: Family Medicine

## 2019-09-24 ENCOUNTER — Other Ambulatory Visit: Payer: Self-pay

## 2019-09-24 ENCOUNTER — Ambulatory Visit (INDEPENDENT_AMBULATORY_CARE_PROVIDER_SITE_OTHER): Payer: Medicare HMO | Admitting: Family Medicine

## 2019-09-24 ENCOUNTER — Encounter: Payer: Self-pay | Admitting: Family Medicine

## 2019-09-24 VITALS — BP 140/86 | HR 96 | Temp 97.5°F | Ht 63.5 in | Wt 165.0 lb

## 2019-09-24 DIAGNOSIS — J4520 Mild intermittent asthma, uncomplicated: Secondary | ICD-10-CM

## 2019-09-24 DIAGNOSIS — E039 Hypothyroidism, unspecified: Secondary | ICD-10-CM

## 2019-09-24 DIAGNOSIS — E538 Deficiency of other specified B group vitamins: Secondary | ICD-10-CM | POA: Diagnosis not present

## 2019-09-24 MED ORDER — LEVOTHYROXINE SODIUM 75 MCG PO TABS: 75.0000 ug | ORAL_TABLET | Freq: Every day | ORAL | 3 refills | Status: DC

## 2019-09-24 MED ORDER — ALBUTEROL SULFATE HFA 108 (90 BASE) MCG/ACT IN AERS: INHALATION_SPRAY | RESPIRATORY_TRACT | 0 refills | Status: DC

## 2019-09-24 NOTE — Progress Notes (Signed)
Subjective:  Patient ID: Christine Valdez, female    DOB: [birth year]  Age: 69 y.o. MRN: [id number]  CC:  Chief Complaint  Patient presents with  . Establish Care    pt reports as far as her general health she feels good with no complaints other than starting some weightloss. pt has started a new diet as of yesterday.    HPI Christine Valdez presents for   Establish care.  Previous patient of Dr. Nolon Rod.  Gynecology - Dr. Sabra Heck.   Hypothyroidism: Lab Results  Component Value Date   TSH 1.470 09/24/2019  had levels checked prior with obgyn, no recent labs.  Taking medication daily. Synthroid. 29mcg qd.  Some heat inloerance in summers. No new cold intolerance. No new hair or skin changes, heart palpitations. 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 No new weight changes - has had some weight gain - plans to adjust diet.   Depression with anxiety: Celexa 40 mg daily, restoril used in past for sleep. Only takes rarely - once per month at most.  No refills needed.  meds working well.   Depression screen [facility] 2/9 09/24/2019 08/20/2019 11/13/2018 05/20/2018 05/04/2018  Decreased Interest 0 0 0 0 0  Down, Depressed, Hopeless 0 0 0 0 0  PHQ - 2 Score 0 0 0 0 0   Asthma: Mild intermittent by history.  Has used albuterol as needed only with exercise - every few months only. flonase as needed only for allergies.    B12 deficiency: 1059mcg qd supplement. Levels 632 in 2018, up form 270 prior.  HM: Plans to schedule physical in 6 months.  Immunization History  Administered Date(s) Administered  . PFIZER SARS-COV-2 Vaccination 04/29/2019, 05/18/2019  . Pneumococcal Conjugate-13 02/02/2018  . Pneumococcal Polysaccharide-23 04/21/2014  . Tdap 06/24/2013  . Zoster Recombinat (Shingrix) 07/23/2017, 09/21/2017    History Patient Active Problem List   Diagnosis Date Noted  . Influenza with respiratory manifestation other than pneumonia 05/05/2018  . Recurrent  major depressive disorder, in partial remission ([place]) 09/28/2016  . Hypothyroidism 09/28/2016  . B12 deficiency 09/28/2016  . Hyperglycemia 09/28/2016  . Pruritus ani 06/25/2015  . 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 Anxiety 02/04/2015  . Major depression, recurrent ([place]) 02/04/2015  . Mild intermittent asthma without complication 04/12/7251  . Osteoarthrosis, unspecified whether generalized or localized, shoulder region 07/04/2013   Past Medical History:  Diagnosis Date  . Allergy    uses Flonase daily as needed and takes Loratadine daily   . Anemia    only after birth of 2nd child  . Arthritis   . Asthma    uses Albuterol daily as needed;exercise induced  . Cancer ([place]) 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, right cheek  . Depression    takes Celexa daily  . GERD (gastroesophageal reflux disease)    occ  . History of colon polyps   . History of shingles    5-6 yrs  . Hyperlipidemia   . Hypothyroid   . Insomnia    takes Restoril nightly as needed  . Interstitial cystitis   . Osteoarthritis   . Sjogren's syndrome ([place])   . Spinal headache    after 1st C/S, o2sat low after shoulder surgery after went to room(51%),hard to urinate)  . Thyroid disease    Phreesia 09/21/2019   Past Surgical History:  Procedure Laterality Date  . CARPAL TUNNEL RELEASE Right 05/2007  . CESAREAN SECTION  1981/1984   x 2  . COLONOSCOPY    .  JOINT REPLACEMENT N/A    Phreesia 09/21/2019  . TONSILLECTOMY AND ADENOIDECTOMY  age 32  . TOTAL SHOULDER ARTHROPLASTY Right 07/04/2013   DR NORRIS  . TOTAL SHOULDER ARTHROPLASTY Right 07/04/2013   Procedure: RIGHT TOTAL SHOULDER ARTHROPLASTY;  Surgeon: Augustin Schooling, MD;  Location: [place];  Service: Orthopedics;  Laterality: Right;  . TOTAL SHOULDER ARTHROPLASTY Left 03/12/2015   Procedure: LEFT SHOULDER TOTAL SHOULDER ARTHROPLASTY;  Surgeon: Netta Cedars, MD;  Location: [place] [place];  Service: Orthopedics;  Laterality: Left;   Allergies  Allergen Reactions  . Diflucan  [Fluconazole] Hives    unknown  . Thimerosal Hives  . Other Rash and Itching  . Mercury Detox [Nutritional Supplements] Hives  . Influenza Vaccine Recombinant Rash    Mercury component and Thierosal in flu vaccine  . Levaquin [Levofloxacin] Rash  . Sulfa Antibiotics Rash   Prior to Admission medications   Medication Sig Start Date End Date Taking? Authorizing Provider  azelastine (ASTELIN) 0.1 % nasal spray Place 2 sprays into both nostrils 2 (two) times daily. Use in each nostril as directed 08/20/19  Yes Maximiano Coss, NP  cholecalciferol (VITAMIN D) 1000 UNITS tablet Take 1,000 Units by mouth daily.   Yes [provider]  citalopram (CELEXA) 40 MG tablet Take 1 tablet (40 mg total) by mouth daily. 10/03/18  Yes Megan Salon, MD  docusate sodium (COLACE) 100 MG capsule Take 400 mg by mouth at bedtime.    Yes [provider]  fluticasone (FLONASE) 50 MCG/ACT nasal spray USE 1-2 SPRAYS INTO EACH NOSTRIL ONCE A DAY AS NEEDED 06/05/19  Yes Stallings, Zoe A, MD  levothyroxine (SYNTHROID) 75 MCG tablet TAKE 1 TABLET BY MOUTH EVERY DAY 01/07/19  Yes Stallings, Zoe A, MD  loratadine (CLARITIN) 10 MG tablet Take 10 mg by mouth daily as needed for allergies.    Yes [provider]  Multiple Vitamin (MULTIVITAMIN) tablet Take by mouth daily.   Yes [provider]  PROAIR HFA 108 (90 Base) MCG/ACT inhaler INHALE 2 PUFFS INTO THE LUNGS EVERY 6 (SIX) HOURS AS NEEDED FOR SHORTNESS OF BREATH. 09/03/18  Yes Stallings, Zoe A, MD  temazepam (RESTORIL) 30 MG capsule TAKE 1 CAPSULE BY MOUTH EVERY DAY AT BEDTIME AS NEEDED 07/04/19  Yes Megan Salon, MD  vitamin B-12 (CYANOCOBALAMIN) 1000 MCG tablet Take 1,000 mcg by mouth daily.   Yes [provider]  guaiFENesin-dextromethorphan (ROBITUSSIN DM) 100-10 MG/5ML syrup Take 5 mLs by mouth every 4 (four) hours as needed for cough. Patient not taking: Reported on 09/24/2019 08/20/19   Maximiano Coss, NP   Social History    Socioeconomic History  . Marital status: Married    Spouse name: Not on file  . Number of children: 2  . Years of education: Not on file  . Highest education level: Not on file  Occupational History  . Not on file  Tobacco Use  . Smoking status: Never Smoker  . Smokeless tobacco: Never Used  Vaping Use  . Vaping Use: Never used  Substance and Sexual Activity  . Alcohol use: Yes    Alcohol/week: 2.0 standard drinks    Types: 2 Glasses of wine per week  . Drug use: No  . Sexual activity: Not Currently    Partners: Male    Birth control/protection: Post-menopausal  Other Topics Concern  . 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 Difficulty of Paying Living Expenses:   Food Insecurity:   . Worried About Charity fundraiser in the Last Year:   . Arboriculturist in the Last Year:   Transportation Needs:   . Film/video editor (Medical):   Marland Kitchen Lack of Transportation (Non-Medical):   Physical Activity:   . Days of Exercise per Week:   . Minutes of Exercise per Session:   Stress:   . Feeling of Stress :   Social Connections:   . Frequency of Communication with Friends and Family:   . Frequency of Social Gatherings with Friends and Family:   . Attends Religious Services:   . Active Member of Clubs or Organizations:   . Attends Archivist Meetings:   Marland Kitchen Marital Status:   Intimate Partner Violence:   . Fear of Current or Ex-Partner:   . Emotionally Abused:   Marland Kitchen Physically Abused:   . Sexually Abused:     Review of Systems Per HPI.   Objective:   Vitals:   09/24/19 1447 09/24/19 1458  BP: (!) 152/89 140/86  Pulse: 96   Temp: (!) 97.5 F (36.4 C)   TempSrc: Temporal   SpO2: 96%   Weight: 165 lb (74.8 kg)   Height: 5'  3.5" (1.613 m)     Physical Exam Vitals reviewed.  Constitutional:      Appearance: She is well-developed.  HENT:     Head: Normocephalic and atraumatic.  Eyes:     Conjunctiva/sclera: Conjunctivae normal.     Pupils: Pupils are equal, round, and reactive to light.  Neck:     Vascular: No carotid bruit.     Comments: No nodules, masses.  Cardiovascular:     Rate and Rhythm: Normal rate and regular rhythm.     Heart sounds: Normal heart sounds.  Pulmonary:     Effort: Pulmonary effort is normal.     Breath sounds: Normal breath sounds.  Abdominal:     Palpations: Abdomen is soft. There is no pulsatile mass.     Tenderness: There is no abdominal tenderness.  Skin:    General: Skin is warm and dry.  Neurological:     Mental Status: She is alert and oriented to person, place, and time.  Psychiatric:        Behavior: Behavior normal.     Assessment & Plan:  Christine Valdez is a 69 y.o. female . Mild intermittent asthma without complication - Plan: albuterol (PROAIR HFA) 108 (90 Base) MCG/ACT inhaler  - stable with rare use albuterol - continue same.   Hypothyroidism, unspecified type - Plan: levothyroxine (SYNTHROID) 75 MCG tablet, TSH  - tsh stable. Continue synthroid same dose.   B12 deficiency - Plan: Vitamin B12  - on supplement. Elevated levels, option of few skipped doses.   Recheck 6 months.  Plans on working on diet, can check other labs, lipids at that time.   Meds ordered this encounter  Medications  . levothyroxine (SYNTHROID) 75 MCG tablet    Sig: Take 1 tablet (75 mcg total) by mouth daily.    Dispense:  90 tablet    Refill:  3  . albuterol (PROAIR HFA) 108 (90 Base) MCG/ACT inhaler    Sig: INHALE 2 PUFFS INTO THE LUNGS EVERY 6 (SIX) HOURS AS NEEDED FOR SHORTNESS OF  BREATH.    Dispense:  18 g    Refill:  0   Patient Instructions   No med changes.  Follow up in 6 months for physical. Thanks for coming in today.    If you have lab work done  today you will be contacted with your lab results within the next 2 weeks.  If you have not heard from [place] then please contact us. The fastest way to get your results is to register for My Chart.   IF you received an x-ray today, you will receive an invoice from [facility]. Please contact [facility] at [phone] with questions or concerns regarding your invoice.   IF you received labwork today, you will receive an invoice from [facility]. Please contact [facility] at [phone] with questions or concerns regarding your invoice.   Our billing staff will not be able to assist you with questions regarding bills from these companies.  You will be contacted with the lab results as soon as they are available. The fastest way to get your results is to activate your My Chart account. Instructions are located on the last page of this paperwork. If you have not heard from [place] regarding the results in 2 weeks, please contact this office.          Signed, Merri Ray, MD [facility]

## 2019-09-24 NOTE — Patient Instructions (Signed)
No med changes.  Follow up in 6 months for physical. Thanks for coming in today.    If you have lab work done today you will be contacted with your lab results within the next 2 weeks.  If you have not heard from [place] then please contact us. The fastest way to get your results is to register for My Chart.   IF you received an x-ray today, you will receive an invoice from [facility]. Please contact [facility] at [phone] with questions or concerns regarding your invoice.   IF you received labwork today, you will receive an invoice from [facility]. Please contact [facility] at [phone] with questions or concerns regarding your invoice.   Our billing staff will not be able to assist you with questions regarding bills from these companies.  You will be contacted with the lab results as soon as they are available. The fastest way to get your results is to activate your My Chart account. Instructions are located on the last page of this paperwork. If you have not heard from [place] regarding the results in 2 weeks, please contact this office.

## 2019-09-25 ENCOUNTER — Telehealth: Payer: Self-pay | Admitting: Family Medicine

## 2019-09-25 ENCOUNTER — Encounter: Payer: Self-pay | Admitting: Family Medicine

## 2019-09-25 LAB — VITAMIN B12: Vitamin B-12: 1943 pg/mL — ABNORMAL HIGH (ref 232–1245)

## 2019-09-25 LAB — TSH: TSH: 1.47 u[IU]/mL (ref 0.450–4.500)

## 2019-09-25 NOTE — Telephone Encounter (Signed)
Pt called and saw her lab results from 09/24/19. Let pt know the dr. has to look over them first and then a nurse will be in contact with her. When labs are viewed pt would like a call regarding her B12. Please advise.

## 2019-09-25 NOTE — Telephone Encounter (Signed)
Do not look like the labs has been review at this time. Please advise on information you would like for me to give to the patient regarding lab results

## 2019-11-17 ENCOUNTER — Other Ambulatory Visit: Payer: Self-pay | Admitting: Obstetrics & Gynecology

## 2019-11-21 ENCOUNTER — Other Ambulatory Visit: Payer: Self-pay | Admitting: Family Medicine

## 2019-11-21 MED ORDER — TEMAZEPAM 30 MG PO CAPS: ORAL_CAPSULE | ORAL | 0 refills | Status: DC

## 2019-11-21 NOTE — Telephone Encounter (Signed)
Patient is requesting a refill of the following medications: Requested Prescriptions   Pending Prescriptions Disp Refills  . temazepam (RESTORIL) 30 MG capsule 30 capsule 0    Sig: TAKE 1 CAPSULE BY MOUTH EVERY DAY AT BEDTIME AS NEEDED    Date of patient request: 11/21/2019 Last office visit: 09/24/2019 Date of last refill: 07/04/2019 Last refill amount: 30 capsule Follow up time period per chart: 03/25/2020

## 2019-11-21 NOTE — Telephone Encounter (Signed)
Patient requesting temazepam (RESTORIL) 30 MG capsule, informed patient please allow 48 to 72 hour turn around time.   New pharmacy -  [id number] - [place], [place] [address]

## 2019-11-21 NOTE — Telephone Encounter (Signed)
Controlled substance database (PDMP) reviewed. No concerns appreciated.  Medication last filled in April.  Recently discussed at visit in June.  Refill ordered.

## 2019-11-24 ENCOUNTER — Ambulatory Visit (INDEPENDENT_AMBULATORY_CARE_PROVIDER_SITE_OTHER): Payer: Medicare HMO | Admitting: Family Medicine

## 2019-11-24 ENCOUNTER — Encounter: Payer: Self-pay | Admitting: Family Medicine

## 2019-11-24 ENCOUNTER — Other Ambulatory Visit: Payer: Self-pay

## 2019-11-24 ENCOUNTER — Other Ambulatory Visit: Payer: Self-pay | Admitting: Family Medicine

## 2019-11-24 VITALS — BP 167/77 | HR 93 | Temp 97.9°F | Ht 63.0 in | Wt 165.4 lb

## 2019-11-24 DIAGNOSIS — S60512A Abrasion of left hand, initial encounter: Secondary | ICD-10-CM | POA: Diagnosis not present

## 2019-11-24 DIAGNOSIS — M79642 Pain in left hand: Secondary | ICD-10-CM | POA: Diagnosis not present

## 2019-11-24 DIAGNOSIS — Z23 Encounter for immunization: Secondary | ICD-10-CM | POA: Diagnosis not present

## 2019-11-24 DIAGNOSIS — W540XXA Bitten by dog, initial encounter: Secondary | ICD-10-CM | POA: Diagnosis not present

## 2019-11-24 MED ORDER — TEMAZEPAM 30 MG PO CAPS: ORAL_CAPSULE | ORAL | 0 refills | Status: DC

## 2019-11-24 MED ORDER — AMOXICILLIN-POT CLAVULANATE 875-125 MG PO TABS: 1.0000 | ORAL_TABLET | Freq: Two times a day (BID) | ORAL | 0 refills | Status: DC

## 2019-11-24 NOTE — Patient Instructions (Addendum)
If you have lab work done today you will be contacted with your lab results within the next 2 weeks.  If you have not heard from [place] then please contact us. The fastest way to get your results is to register for My Chart.   IF you received an x-ray today, you will receive an invoice from [facility]. Please contact [facility] at [phone] with questions or concerns regarding your invoice.   IF you received labwork today, you will receive an invoice from [facility]. Please contact [facility] at [phone] with questions or concerns regarding your invoice.   Our billing staff will not be able to assist you with questions regarding bills from these companies.  You will be contacted with the lab results as soon as they are available. The fastest way to get your results is to activate your My Chart account. Instructions are located on the last page of this paperwork. If you have not heard from [place] regarding the results in 2 weeks, please contact this office.      Animal Bite, Adult Animal bites range from mild to serious. An animal bite can result in any of these injuries:  A scratch.  A deep, open cut.  A puncture of the skin.  A crush injury.  Tearing away of the skin or a body part.  A bone injury. A small bite from a house pet is usually less serious than a bite from a stray or wild animal, such as a raccoon, fox, skunk, or bat. That is because stray and wild animals have a higher risk of carrying a serious infection called rabies, which can be passed to humans through a bite. What increases the risk? You are more likely to be bitten by an animal if:  You are around unfamiliar pets.  You disturb an animal when it is eating, sleeping, or caring for its babies.  You are outdoors in a place where small, wild animals roam freely. What are the signs or symptoms? Common symptoms of an animal bite include:  Pain.  Bleeding.  Swelling.  Bruising. How is  this diagnosed? This condition may be diagnosed based on a physical exam and medical history. Your health care provider will examine your wound and ask for details about the animal and how the bite happened. You may also have tests, such as:  Blood tests to check for infection.  X-rays to check for damage to bones or joints.  Taking a fluid sample from your wound and checking it for infection (culture test). How is this treated? Treatment varies depending on the type of animal, where the bite is on your body, and your medical history. Treatment may include:  Caring for the wound. This often includes cleaning the wound, rinsing out (flushing) the wound with saline solution, and applying a bandage (dressing). In some cases, the wound may be closed with stitches (sutures), staples, skin glue, or adhesive strips.  Antibiotic medicine to prevent or treat infection. This medicine may be prescribed in pill or ointment form. If the bite area becomes infected, the medicine may be given through an IV.  A tetanus shot to prevent tetanus infection.  Rabies treatment to prevent rabies infection. This will be done if the animal could have rabies.  Surgery. This may be done if a bite gets infected or if there is damage that needs to be repaired. Follow these instructions at home: Wound care   Follow instructions from your health care provider about how  to take care of your wound. Make sure you: ? Wash your hands with soap and water before you change your dressing. If soap and water are not available, use hand sanitizer. ? Change your dressing as told by your health care provider. ? Leave sutures, skin glue, or adhesive strips in place. These skin closures may need to stay in place for 2 weeks or longer. If adhesive strip edges start to loosen and curl up, you may trim the loose edges. Do not remove adhesive strips completely unless your health care provider tells you to do that.  Check your wound every  day for signs of infection. Check for: ? More redness, swelling, or pain. ? More fluid or blood. ? Warmth. ? Pus or a bad smell. Medicines  Take or apply over-the-counter and prescription medicines only as told by your health care provider.  If you were prescribed an antibiotic, take or apply it as told by your health care provider. Do not stop using the antibiotic even if your condition improves. General instructions   Keep the injured area raised (elevated) above the level of your heart while you are sitting or lying down, if this is possible.  If directed, put ice on the injured area. ? Put ice in a plastic bag. ? Place a towel between your skin and the bag. ? Leave the ice on for 20 minutes, 2-3 times per day.  Keep all follow-up visits as told by your health care provider. This is important. Contact a health care provider if:  You have more redness, swelling, or pain around your wound.  Your wound feels warm to the touch.  You have a fever or chills.  You have a general feeling of sickness (malaise).  You feel nauseous or you vomit.  You have pain that does not get better. Get help right away if:  You have a red streak that leads away from your wound.  You have non-clear fluid or more blood coming from your wound.  There is pus or a bad smell coming from your wound.  You have trouble moving your injured area.  You have numbness or tingling that extends beyond the wound. Summary  Animal bites can range from mild to serious. An animal bite can cause a scratch on the skin, a deep open cut, a puncture of the skin, a crush injury, tearing away of the skin or a body part, or a bone injury.  Your health care provider will examine your wound and ask for details about the animal and how the bite happened.  You may also have tests such as a blood test, X-ray, or testing of a fluid sample from your wound (culture test).  Treatment may include wound care, antibiotic  medicine, a tetanus shot, and rabies treatment if the animal could have rabies. This information is not intended to replace advice given to you by your health care provider. Make sure you discuss any questions you have with your health care provider. Document Revised: 03/22/2017 Document Reviewed: 10/05/2016 Elsevier Patient Education  [place].

## 2019-11-24 NOTE — Progress Notes (Signed)
8/16/20213:20 PM  Makaelah Cranfield 07-29-1950, 69 y.o., female [id number]  Chief Complaint  Patient presents with  . Animal Bite    dog bite Sat 11/22/19    HPI:   Patient is a 69 y.o. female who presents today for dog bite 2 days ago  She got bite in left hand 2 days ago It was a neighbors dog as she was trying to feed them She does not know if dog vaccinated but they have tags Last Td 2015 She has been cleaning wound and applying antibiotic ointment  Requesting refill of restoril, PCP Dr Carlota Raspberry, Last OV June pmp reviewed, last refill April #30 tabs - discussed with Dr Carlota Raspberry, he sent refill in  Depression screen [facility] 2/9 09/24/2019 08/20/2019 11/13/2018  Decreased Interest 0 0 0  Down, Depressed, Hopeless 0 0 0  PHQ - 2 Score 0 0 0    Fall Risk  11/24/2019 09/24/2019 08/20/2019 11/13/2018 05/20/2018  Falls in the past year? 0 0 0 0 0  Number falls in past yr: 0 - 0 0 0  Injury with Fall? 0 - 0 0 0  Follow up Falls evaluation completed Falls evaluation completed Falls evaluation completed Falls evaluation completed -     Allergies  Allergen Reactions  . Diflucan [Fluconazole] Hives    unknown  . Thimerosal Hives  . Other Rash and Itching  . Mercury Detox [Nutritional Supplements] Hives  . Influenza Vaccine Recombinant Rash    Mercury component and Thierosal in flu vaccine  . Levaquin [Levofloxacin] Rash  . Sulfa Antibiotics Rash    Prior to Admission medications   Medication Sig Start Date End Date Taking? Authorizing Provider  albuterol (PROAIR HFA) 108 (90 Base) MCG/ACT inhaler INHALE 2 PUFFS INTO THE LUNGS EVERY 6 (SIX) HOURS AS NEEDED FOR SHORTNESS OF BREATH. 09/24/19  Yes Wendie Agreste, MD  azelastine (ASTELIN) 0.1 % nasal spray Place 2 sprays into both nostrils 2 (two) times daily. Use in each nostril as directed 08/20/19  Yes Maximiano Coss, NP  cholecalciferol (VITAMIN D) 1000 UNITS tablet Take 1,000 Units by mouth daily.   Yes [provider]    citalopram (CELEXA) 40 MG tablet Take 1 tablet (40 mg total) by mouth daily. 10/03/18  Yes Megan Salon, MD  docusate sodium (COLACE) 100 MG capsule Take 400 mg by mouth at bedtime.    Yes [provider]  fluticasone (FLONASE) 50 MCG/ACT nasal spray USE 1-2 SPRAYS INTO EACH NOSTRIL ONCE A DAY AS NEEDED 06/05/19  Yes Delia Chimes A, MD  levothyroxine (SYNTHROID) 75 MCG tablet Take 1 tablet (75 mcg total) by mouth daily. 09/24/19  Yes Wendie Agreste, MD  loratadine (CLARITIN) 10 MG tablet Take 10 mg by mouth daily as needed for allergies.    Yes [provider]  Multiple Vitamin (MULTIVITAMIN) tablet Take by mouth daily.   Yes [provider]  temazepam (RESTORIL) 30 MG capsule TAKE 1 CAPSULE BY MOUTH EVERY DAY AT BEDTIME AS NEEDED 11/21/19  Yes Wendie Agreste, MD  vitamin B-12 (CYANOCOBALAMIN) 1000 MCG tablet Take 1,000 mcg by mouth daily.   Yes [provider]  guaiFENesin-dextromethorphan (ROBITUSSIN DM) 100-10 MG/5ML syrup Take 5 mLs by mouth every 4 (four) hours as needed for cough. Patient not taking: Reported on 09/24/2019 08/20/19   Maximiano Coss, NP    Past Medical History:  Diagnosis Date  . Allergy    uses Flonase daily as needed and takes Loratadine daily   .  Anemia    only after birth of 2nd child  . Arthritis   . Asthma    uses Albuterol daily as needed;exercise induced  . Cancer ([place]) 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, right cheek  . Depression    takes Celexa daily  . GERD (gastroesophageal reflux disease)    occ  . History of colon polyps   . History of shingles    5-6 yrs  . Hyperlipidemia   . Hypothyroid   . Insomnia    takes Restoril nightly as needed  . Interstitial cystitis   . Osteoarthritis   . Sjogren's syndrome ([place])   . Spinal headache    after 1st C/S, o2sat low after shoulder surgery after went to room(51%),hard to urinate)  . Thyroid disease    Phreesia 09/21/2019    Past Surgical History:  Procedure Laterality  Date  . CARPAL TUNNEL RELEASE Right 05/2007  . CESAREAN SECTION  1981/1984   x 2  . COLONOSCOPY    . JOINT REPLACEMENT N/A    Phreesia 09/21/2019  . TONSILLECTOMY AND ADENOIDECTOMY  age 41  . TOTAL SHOULDER ARTHROPLASTY Right 07/04/2013   DR NORRIS  . TOTAL SHOULDER ARTHROPLASTY Right 07/04/2013   Procedure: RIGHT TOTAL SHOULDER ARTHROPLASTY;  Surgeon: Augustin Schooling, MD;  Location: [place];  Service: Orthopedics;  Laterality: Right;  . TOTAL SHOULDER ARTHROPLASTY Left 03/12/2015   Procedure: LEFT SHOULDER TOTAL SHOULDER ARTHROPLASTY;  Surgeon: Netta Cedars, MD;  Location: [place];  Service: Orthopedics;  Laterality: Left;    Social History   Tobacco Use  . Smoking status: Never Smoker  . Smokeless tobacco: Never Used  Substance Use Topics  . Alcohol use: Yes    Alcohol/week: 2.0 standard drinks    Types: 2 Glasses of wine per week    Family History  Problem Relation Age of Onset  . Breast cancer Sister 31       stage 4, chemo and radiation  . Esophageal cancer Sister   . Diabetes Brother   . Epilepsy Daughter   . Hypertension Mother   . Osteoporosis Mother   . Diabetes Father   . Hypertension Father   . Lung cancer Paternal Aunt     Review of Systems  Constitutional: Negative for chills, diaphoresis, fever and malaise/fatigue.  Gastrointestinal: Negative for nausea and vomiting.   Per hpi  OBJECTIVE:  Today's Vitals   11/24/19 1454  BP: (!) 167/77  Pulse: 93  Temp: 97.9 F (36.6 C)  TempSrc: Temporal  SpO2: 95%  Weight: 165 lb 6.4 oz (75 kg)  Height: 5\' 3"  (1.6 m)   Body mass index is 29.3 kg/m.   Physical Exam Vitals and nursing note reviewed.  Constitutional:      Appearance: She is well-developed.  HENT:     Head: Normocephalic and atraumatic.  Eyes:     General: No scleral icterus.    Conjunctiva/sclera: Conjunctivae normal.     Pupils: Pupils are equal, round, and reactive to light.  Pulmonary:     Effort: Pulmonary effort is normal.    Musculoskeletal:     Cervical back: Neck supple.  Skin:    General: Skin is warm and dry.     Comments: Left hand, dorsal side, space between thumb and 2nd digit, with ~ 1.5cm abrasion, no puncture wound apparent. No drainage, no erythema or warmth Hand FROM, distally NVI  Neurological:     Mental Status: She is alert and oriented to person, place, and time.  No results found for this or any previous visit (from the past 24 hour(s)).  No results found.   ASSESSMENT and PLAN  1. Left hand pain 2. Dog bite, initial encounter Discussed supportive measures, Td booster and prophylactic abx given. new meds r/se/b and RTC precautions. Patient educational handout given. Other orders - amoxicillin-clavulanate (AUGMENTIN) 875-125 MG tablet; Take 1 tablet by mouth 2 (two) times daily. - Td vaccine greater than or equal to 7yo preservative free IM  Return if symptoms worsen or fail to improve.    Rutherford Guys, MD [facility] at [facility], [place] [address] Ph.  [phone] Fax [phone]

## 2019-11-24 NOTE — Progress Notes (Signed)
See previous notes regarding temazepam.  Intermittent use.  Due for refill.  Here to see my colleague for unrelated issues today.  Order placed

## 2019-11-25 ENCOUNTER — Encounter: Payer: Self-pay | Admitting: Family Medicine

## 2019-11-30 DIAGNOSIS — Z20822 Contact with and (suspected) exposure to covid-19: Secondary | ICD-10-CM | POA: Diagnosis not present

## 2019-12-05 NOTE — Progress Notes (Signed)
69 y.o. G38P0102 Married White or Caucasian female here for breast and pelvic exam.  I am also following her for her osteopenia and managing her anti-depressant and have written temazpam for her due to issues with insomnia.  Last BMD was 12/2018.  Mild osteopenia present.  Her mother had hx of osteoporosis and fractures.  I also manage her anti-depressant.    She is currently on citalopram.  Feels good.  This has helped in the past year.  Does not want to make any changes.  Recently was on antibiotic due to recent dog bite.  Asked for antifungal when given antibiotics because has issues with yeast vaginitis was antibiotics.  Was told to call that office.  Had upcoming appt here and has just waited.  Having vaginal itching and burning with discharge.  Lastly, she had some issues with getting the temazepam filled.  Discussed is a controlled substance.  Not recommended >65 due to risk of falls and all reviewed.  Has rx from Dr. Carlota Raspberry.  Denies vaginal bleeding.  Patient's last menstrual period was 04/11/2003 (approximate).          Sexually active: No.  H/O STD:  no  Health Maintenance: PCP: Merri Ray.  Last wellness appt was 6/21.  Did blood work at that appt: yes  Vaccines are up to date:  yes Colonoscopy:  08-09-14 f/u 24yrs.  Checked with Dr. Collene Mares this year.  States she was advised guidelines have changed and she is not due yet.   MMG:  Bilateral 11-05-2018 & left breast 11-12-2018 category c density birads 1:neg BMD:  12-23-2018 mild osteopenia f/u 40yrs Last pap smear:  04-17-16 neg HPV HR neg, 10-03-2018 neg H/o abnormal pap smear:  no    reports that she has never smoked. She has never used smokeless tobacco. She reports current alcohol use of about 2.0 standard drinks of alcohol per week. She reports that she does not use drugs.  Past Medical History:  Diagnosis Date  . Allergy    uses Flonase daily as needed and takes Loratadine daily   . Anemia    only after birth of 2nd child  .  Arthritis   . Asthma    uses Albuterol daily as needed;exercise induced  . Cancer ([place]) 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, right cheek  . Depression    takes Celexa daily  . GERD (gastroesophageal reflux disease)    occ  . History of colon polyps   . History of shingles    5-6 yrs  . Hyperlipidemia   . Hypothyroid   . Insomnia    takes Restoril nightly as needed  . Interstitial cystitis   . Osteoarthritis   . Sjogren's syndrome ([place])   . Spinal headache    after 1st C/S, o2sat low after shoulder surgery after went to room(51%),hard to urinate)  . Thyroid disease    Phreesia 09/21/2019    Past Surgical History:  Procedure Laterality Date  . CARPAL TUNNEL RELEASE Right 05/2007  . CESAREAN SECTION  1981/1984   x 2  . COLONOSCOPY    . JOINT REPLACEMENT N/A    Phreesia 09/21/2019  . TONSILLECTOMY AND ADENOIDECTOMY  age 46  . TOTAL SHOULDER ARTHROPLASTY Right 07/04/2013   DR NORRIS  . TOTAL SHOULDER ARTHROPLASTY Right 07/04/2013   Procedure: RIGHT TOTAL SHOULDER ARTHROPLASTY;  Surgeon: Augustin Schooling, MD;  Location: [facility];  Service: Orthopedics;  Laterality: Right;  . TOTAL SHOULDER ARTHROPLASTY Left 03/12/2015   Procedure: LEFT SHOULDER TOTAL SHOULDER  ARTHROPLASTY;  Surgeon: Netta Cedars, MD;  Location: [facility];  Service: Orthopedics;  Laterality: Left;    Current Outpatient Medications  Medication Sig Dispense Refill  . albuterol (PROAIR HFA) 108 (90 Base) MCG/ACT inhaler INHALE 2 PUFFS INTO THE LUNGS EVERY 6 (SIX) HOURS AS NEEDED FOR SHORTNESS OF BREATH. 18 g 0  . azelastine (ASTELIN) 0.1 % nasal spray Place 2 sprays into both nostrils 2 (two) times daily. Use in each nostril as directed 30 mL 12  . cholecalciferol (VITAMIN D) 1000 UNITS tablet Take 1,000 Units by mouth daily.    . citalopram (CELEXA) 40 MG tablet Take 1 tablet (40 mg total) by mouth daily. 90 tablet 4  . docusate sodium (COLACE) 100 MG capsule Take 400 mg by mouth at bedtime.     . fluticasone (FLONASE) 50 MCG/ACT nasal  spray USE 1-2 SPRAYS INTO EACH NOSTRIL ONCE A DAY AS NEEDED 48 mL 1  . levothyroxine (SYNTHROID) 75 MCG tablet Take 1 tablet (75 mcg total) by mouth daily. 90 tablet 3  . loratadine (CLARITIN) 10 MG tablet Take 10 mg by mouth daily as needed for allergies.     Marland Kitchen temazepam (RESTORIL) 30 MG capsule TAKE 1 CAPSULE BY MOUTH EVERY DAY AT BEDTIME AS NEEDED 30 capsule 0  . vitamin B-12 (CYANOCOBALAMIN) 1000 MCG tablet Take 1,000 mcg by mouth every other day.      No current facility-administered medications for this visit.    Family History  Problem Relation Age of Onset  . Breast cancer Sister 17       stage 4, chemo and radiation  . Esophageal cancer Sister   . Diabetes Brother   . Epilepsy Daughter   . Hypertension Mother   . Osteoporosis Mother   . Diabetes Father   . Hypertension Father   . Lung cancer Paternal Aunt     Review of Systems  Constitutional: Negative.   HENT: Negative.   Eyes: Negative.   Respiratory: Negative.   Cardiovascular: Negative.   Gastrointestinal: Negative.   Endocrine: Negative.   Genitourinary:       Vaginal itching  Musculoskeletal: Negative.   Skin: Negative.   Allergic/Immunologic: Negative.   Neurological: Negative.   Hematological: Negative.   Psychiatric/Behavioral: Negative.     Exam:   Ht 5\' 3"  (1.6 m)   Wt 163 lb (73.9 kg)   LMP 04/11/2003 (Approximate)   BMI 28.87 kg/m   Height: 5\' 3"  (160 cm)  General appearance: alert, cooperative and appears stated age Breasts: normal appearance, no masses or tenderness Abdomen: soft, non-tender; bowel sounds normal; no masses,  no organomegaly Lymph nodes: Cervical, supraclavicular, and axillary nodes normal.  No abnormal inguinal nodes palpated Neurologic: Grossly normal  Pelvic: External genitalia:  no lesions, inner labia majora erythema              Urethra:  normal appearing urethra with no masses, tenderness or lesions              Bartholins and Skenes: normal                  Vagina: normal appearing vagina with mild erythema and whitish discharge noted, no lesions              Cervix: no lesions              Pap taken: No. Bimanual Exam:  Uterus:  normal size, contour, position, consistency, mobility, non-tender  Adnexa: normal adnexa and no mass, fullness, tenderness               Rectovaginal: Confirms               Anus:  normal sphincter tone, no lesions  Chaperone, Olene Floss, CMA, was present for exam.  A:  Breast and Pelvic exam PMP, no HRT H/o depression yeast vaginitis Family hx of breast cancer in sister Elevated lipids, Sjogren's syndrome, Hypothyroidism Family hx of osteoporosis in mother Osteopenia H/o insomnia Yeast vaginitis  P:   Mammogram guidelines reviewed.  Doing yearly. pap smear neg 2002.  Not indicated today. rx for terazol one applicator nightly x 7 nights to pharmacy.  Pt is allergic to oral fluconazole but can take terazol Lab work done in June Colonoscopy up to date rx for citalopram 40mg  daily.  #90/4RF Repeat BMD 2 years Pt will return in 2 years for breast/pelvic and sooner for any new issues/concerns.  33 minutes spent with pt in addition to performing the breast and pelvic exam

## 2019-12-09 ENCOUNTER — Other Ambulatory Visit: Payer: Self-pay

## 2019-12-09 ENCOUNTER — Ambulatory Visit (INDEPENDENT_AMBULATORY_CARE_PROVIDER_SITE_OTHER): Payer: Medicare HMO | Admitting: Obstetrics & Gynecology

## 2019-12-09 ENCOUNTER — Encounter: Payer: Self-pay | Admitting: Obstetrics & Gynecology

## 2019-12-09 VITALS — BP 112/70 | HR 68 | Resp 16 | Ht 63.0 in | Wt 163.0 lb

## 2019-12-09 DIAGNOSIS — Z01419 Encounter for gynecological examination (general) (routine) without abnormal findings: Secondary | ICD-10-CM | POA: Diagnosis not present

## 2019-12-09 DIAGNOSIS — M8580 Other specified disorders of bone density and structure, unspecified site: Secondary | ICD-10-CM

## 2019-12-09 DIAGNOSIS — Z8262 Family history of osteoporosis: Secondary | ICD-10-CM | POA: Diagnosis not present

## 2019-12-09 DIAGNOSIS — Z8659 Personal history of other mental and behavioral disorders: Secondary | ICD-10-CM | POA: Diagnosis not present

## 2019-12-09 DIAGNOSIS — B3731 Acute candidiasis of vulva and vagina: Secondary | ICD-10-CM

## 2019-12-09 DIAGNOSIS — B373 Candidiasis of vulva and vagina: Secondary | ICD-10-CM

## 2019-12-09 DIAGNOSIS — R69 Illness, unspecified: Secondary | ICD-10-CM | POA: Diagnosis not present

## 2019-12-09 MED ORDER — TERCONAZOLE 0.4 % VA CREA: 1.0000 | TOPICAL_CREAM | Freq: Every day | VAGINAL | 0 refills | Status: DC

## 2019-12-09 MED ORDER — CITALOPRAM HYDROBROMIDE 40 MG PO TABS: 40.0000 mg | ORAL_TABLET | Freq: Every day | ORAL | 4 refills | Status: DC

## 2019-12-10 ENCOUNTER — Other Ambulatory Visit: Payer: Self-pay | Admitting: Obstetrics & Gynecology

## 2019-12-10 DIAGNOSIS — Z1231 Encounter for screening mammogram for malignant neoplasm of breast: Secondary | ICD-10-CM

## 2019-12-11 ENCOUNTER — Telehealth: Payer: Self-pay | Admitting: *Deleted

## 2019-12-11 DIAGNOSIS — H11123 Conjunctival concretions, bilateral: Secondary | ICD-10-CM | POA: Diagnosis not present

## 2019-12-11 DIAGNOSIS — H02831 Dermatochalasis of right upper eyelid: Secondary | ICD-10-CM | POA: Diagnosis not present

## 2019-12-11 DIAGNOSIS — H40013 Open angle with borderline findings, low risk, bilateral: Secondary | ICD-10-CM | POA: Diagnosis not present

## 2019-12-11 DIAGNOSIS — H1045 Other chronic allergic conjunctivitis: Secondary | ICD-10-CM | POA: Diagnosis not present

## 2019-12-11 DIAGNOSIS — H04123 Dry eye syndrome of bilateral lacrimal glands: Secondary | ICD-10-CM | POA: Diagnosis not present

## 2019-12-11 DIAGNOSIS — H02834 Dermatochalasis of left upper eyelid: Secondary | ICD-10-CM | POA: Diagnosis not present

## 2019-12-11 DIAGNOSIS — H2513 Age-related nuclear cataract, bilateral: Secondary | ICD-10-CM | POA: Diagnosis not present

## 2019-12-11 NOTE — Telephone Encounter (Signed)
Dr. Carlota Raspberry  Insurance is denying the Temazepam,  They want to know if there is any reason the patient cannot take Doxepin 3 mg or 6mg  instead (NON-HRM)

## 2019-12-12 NOTE — Telephone Encounter (Signed)
will send patient message.

## 2019-12-16 ENCOUNTER — Ambulatory Visit: Payer: Medicare HMO | Attending: Internal Medicine

## 2019-12-16 DIAGNOSIS — Z23 Encounter for immunization: Secondary | ICD-10-CM

## 2019-12-16 NOTE — Progress Notes (Signed)
   Covid-19 Vaccination Clinic  Name:  Kaytlan Behrman    MRN: [id number] DOB: 01/21/1951  12/16/2019  Ms. Chalk was observed post Covid-19 immunization for 30 minutes based on pre-vaccination screening without incident. She was provided with Vaccine Information Sheet and instruction to access the V-Safe system.   Ms. Goodell was instructed to call [phone] with any severe reactions post vaccine: Marland Kitchen Difficulty breathing  . Swelling of face and throat  . A fast heartbeat  . A bad rash all over body  . Dizziness and weakness

## 2020-01-18 DIAGNOSIS — R69 Illness, unspecified: Secondary | ICD-10-CM | POA: Diagnosis not present

## 2020-02-02 ENCOUNTER — Other Ambulatory Visit: Payer: Self-pay

## 2020-02-02 ENCOUNTER — Ambulatory Visit
Admission: RE | Admit: 2020-02-02 | Discharge: 2020-02-02 | Disposition: A | Discharge status: Home / Self Care | Payer: Medicare HMO | Source: Ambulatory Visit | Attending: Obstetrics & Gynecology | Admitting: Obstetrics & Gynecology

## 2020-02-02 DIAGNOSIS — Z1231 Encounter for screening mammogram for malignant neoplasm of breast: Secondary | ICD-10-CM | POA: Diagnosis not present

## 2020-03-25 ENCOUNTER — Encounter: Payer: Medicare HMO | Admitting: Family Medicine

## 2020-04-08 ENCOUNTER — Ambulatory Visit: Payer: Medicare HMO | Admitting: Family Medicine

## 2020-06-02 ENCOUNTER — Telehealth (INDEPENDENT_AMBULATORY_CARE_PROVIDER_SITE_OTHER): Payer: Medicare HMO | Admitting: Family Medicine

## 2020-06-02 ENCOUNTER — Other Ambulatory Visit: Payer: Self-pay

## 2020-06-02 ENCOUNTER — Encounter: Payer: Self-pay | Admitting: Family Medicine

## 2020-06-02 VITALS — Ht 63.0 in | Wt 159.0 lb

## 2020-06-02 DIAGNOSIS — J0110 Acute frontal sinusitis, unspecified: Secondary | ICD-10-CM | POA: Diagnosis not present

## 2020-06-02 MED ORDER — AMOXICILLIN-POT CLAVULANATE 875-125 MG PO TABS: 1.0000 | ORAL_TABLET | Freq: Two times a day (BID) | ORAL | 0 refills | Status: AC

## 2020-06-02 NOTE — Progress Notes (Signed)
Virtual Visit Note  I connected with patient on 06/02/20 at 1617 by telephone due to unable to work Epic video visit and verified that I am speaking with the correct person using two identifiers. Christine Valdez is currently located at home and no family members are currently with them during visit. The provider, Laurita Quint Keela Rubert, FNP is located in their office at time of visit.  I discussed the limitations, risks, security and privacy concerns of performing an evaluation and management service by telephone and the availability of in person appointments. I also discussed with the patient that there may be a patient responsible charge related to this service. The patient expressed understanding and agreed to proceed.   I provided 20 minutes of non-face-to-face time during this encounter.  Chief Complaint  Patient presents with  . Cough  . Headache  . Sinus Problem    Been going on a week and took lots of OTC in the past week.     HPI  Has had a sinus infection for awhile Once or twice a year gets sinus infections Has frequent issues with allergies Uses decongestant, saline spray, nasal sprays, ibuprofen Extreme sinus pain and headaches Extreme fatigue and feels miserable Takes nightly claritin Her sister had to have surgery for sinus infections    Allergies  Allergen Reactions  . Diflucan [Fluconazole] Hives    unknown  . Thimerosal Hives  . Mercury Detox [Nutritional Supplements] Hives  . Influenza Vaccine Recombinant Rash    Mercury component and Thierosal in flu vaccine  . Levaquin [Levofloxacin] Rash  . Sulfa Antibiotics Rash    Prior to Admission medications   Medication Sig Start Date End Date Taking? Authorizing Provider  albuterol (PROAIR HFA) 108 (90 Base) MCG/ACT inhaler INHALE 2 PUFFS INTO THE LUNGS EVERY 6 (SIX) HOURS AS NEEDED FOR SHORTNESS OF BREATH. 09/24/19  Yes Wendie Agreste, MD  azelastine (ASTELIN) 0.1 % nasal spray Place 2 sprays into both  nostrils 2 (two) times daily. Use in each nostril as directed 08/20/19  Yes Maximiano Coss, NP  cholecalciferol (VITAMIN D) 1000 UNITS tablet Take 1,000 Units by mouth daily.   Yes [provider]  citalopram (CELEXA) 40 MG tablet Take 1 tablet (40 mg total) by mouth daily. 12/09/19  Yes Megan Salon, MD  docusate sodium (COLACE) 100 MG capsule Take 400 mg by mouth at bedtime.    Yes [provider]  fluticasone (FLONASE) 50 MCG/ACT nasal spray USE 1-2 SPRAYS INTO EACH NOSTRIL ONCE A DAY AS NEEDED 06/05/19  Yes Delia Chimes A, MD  levothyroxine (SYNTHROID) 75 MCG tablet Take 1 tablet (75 mcg total) by mouth daily. 09/24/19  Yes Wendie Agreste, MD  loratadine (CLARITIN) 10 MG tablet Take 10 mg by mouth daily as needed for allergies.    Yes [provider]  vitamin B-12 (CYANOCOBALAMIN) 1000 MCG tablet Take 1,000 mcg by mouth every other day.    Yes [provider]  temazepam (RESTORIL) 30 MG capsule TAKE 1 CAPSULE BY MOUTH EVERY DAY AT BEDTIME AS NEEDED Patient not taking: Reported on 06/02/2020 11/24/19   Wendie Agreste, MD    Past Medical History:  Diagnosis Date  . Allergy    uses Flonase daily as needed and takes Loratadine daily   . Anemia    only after birth of 2nd child  . Arthritis   . Asthma    uses Albuterol daily as needed;exercise induced  . 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 Depression    takes Celexa daily  . GERD (gastroesophageal reflux disease)    occ  . History of colon polyps   . History of shingles    5-6 yrs  . Hyperlipidemia   . Hypothyroid   . Insomnia    takes Restoril nightly as needed  . Interstitial cystitis   . Osteoarthritis   . Sjogren's syndrome ([place])   . Spinal headache    after 1st C/S, o2sat low after shoulder surgery after went to room(51%),hard to urinate)  . Thyroid disease    Phreesia 09/21/2019    Past Surgical History:  Procedure Laterality Date  . CARPAL TUNNEL RELEASE Right  05/2007  . CESAREAN SECTION  1981/1984   x 2  . COLONOSCOPY    . JOINT REPLACEMENT N/A    [employer]reesia 09/21/2019  . TONSILLECTOMY AND ADENOIDECTOMY  age 74  . TOTAL SHOULDER ARTHROPLASTY Right 07/04/2013   DR NORRIS  . TOTAL SHOULDER ARTHROPLASTY Right 07/04/2013   Procedure: RIGHT TOTAL SHOULDER ARTHROPLASTY;  Surgeon: Augustin Schooling, MD;  Location: [facility];  Service: Orthopedics;  Laterality: Right;  . TOTAL SHOULDER ARTHROPLASTY Left 03/12/2015   Procedure: LEFT SHOULDER TOTAL SHOULDER ARTHROPLASTY;  Surgeon: Netta Cedars, MD;  Location: [facility];  Service: Orthopedics;  Laterality: Left;    Social History   Tobacco Use  . Smoking status: Never Smoker  . Smokeless tobacco: Never Used  Substance Use Topics  . Alcohol use: Yes    Alcohol/week: 2.0 standard drinks    Types: 2 Glasses of wine per week    Family History  Problem Relation Age of Onset  . Breast cancer Sister 23       stage 4, chemo and radiation  . Esophageal cancer Sister   . Diabetes Brother   . Epilepsy Daughter   . Hypertension Mother   . Osteoporosis Mother   . Diabetes Father   . Hypertension Father   . Lung cancer Paternal Aunt     Review of Systems  Constitutional: Positive for malaise/fatigue. Negative for chills and fever.  HENT: Positive for sinus pain. Negative for ear discharge, ear pain, hearing loss, sore throat and tinnitus.   Respiratory: Positive for cough. Negative for sputum production and shortness of breath.   Cardiovascular: Negative for chest pain and palpitations.  Gastrointestinal: Negative for abdominal pain, constipation, diarrhea, heartburn, nausea and vomiting.  Musculoskeletal: Negative for myalgias.  Neurological: Positive for headaches. Negative for dizziness, tingling and sensory change.    Objective  Constitutional:      General: Not in acute distress.    Appearance: Normal appearance. Not ill-appearing.   Pulmonary:     Effort: Pulmonary effort is normal. No respiratory  distress.  Neurological:     Mental Status: Alert and oriented to person, place, and time.  Psychiatric:        Mood and Affect: Mood normal.        Behavior: Behavior normal.     ASSESSMENT and PLAN  Problem List Items Addressed This Visit   None   Visit Diagnoses    Acute non-recurrent frontal sinusitis    -  Primary   Relevant Medications   amoxicillin-clavulanate (AUGMENTIN) 875-125 MG tablet      Plan . Augmentin bid for 5 days . RTC/ED precuations discussed . If continues, needs to be seen in person . Continue conservative and OTC treatments.   Return if symptoms worsen or fail to improve.    The above  assessment and management plan was discussed with the patient. The patient verbalized understanding of and has agreed to the management plan. Patient is aware to call the clinic if symptoms persist or worsen. Patient is aware when to return to the clinic for a follow-up visit. Patient educated on when it is appropriate to go to the emergency department.     Huston Foley Akiva Brassfield, FNP-BC [facility] [place], [place] [address] Ph.  [phone] Fax [phone]

## 2020-06-02 NOTE — Patient Instructions (Addendum)
  Sinusitis, Adult Sinusitis is soreness and swelling (inflammation) of your sinuses. Sinuses are hollow spaces in the bones around your face. They are located:  Around your eyes.  In the middle of your forehead.  Behind your nose.  In your cheekbones. Your sinuses and nasal passages are lined with a fluid called mucus. Mucus drains out of your sinuses. Swelling can trap mucus in your sinuses. This lets germs (bacteria, virus, or fungus) grow, which leads to infection. Most of the time, this condition is caused by a virus. What are the causes? This condition is caused by:  Allergies.  Asthma.  Germs.  Things that block your nose or sinuses.  Growths in the nose (nasal polyps).  Chemicals or irritants in the air.  Fungus (rare). What increases the risk? You are more likely to develop this condition if:  You have a weak body defense system (immune system).  You do a lot of swimming or diving.  You use nasal sprays too much.  You smoke. What are the signs or symptoms? The main symptoms of this condition are pain and a feeling of pressure around the sinuses. Other symptoms include:  Stuffy nose (congestion).  Runny nose (drainage).  Swelling and warmth in the sinuses.  Headache.  Toothache.  A cough that may get worse at night.  Mucus that collects in the throat or the back of the nose (postnasal drip).  Being unable to smell and taste.  Being very tired (fatigue).  A fever.  Sore throat.  Bad breath. How is this diagnosed? This condition is diagnosed based on:  Your symptoms.  Your medical history.  A physical exam.  Tests to find out if your condition is short-term (acute) or long-term (chronic). Your doctor may: ? Check your nose for growths (polyps). ? Check your sinuses using a tool that has a light (endoscope). ? Check for allergies or germs. ? Do imaging tests, such as an MRI or CT scan. How is this treated? Treatment for this  condition depends on the cause and whether it is short-term or long-term.  If caused by a virus, your symptoms should go away on their own within 10 days. You may be given medicines to relieve symptoms. They include: ? Medicines that shrink swollen tissue in the nose. ? Medicines that treat allergies (antihistamines). ? A spray that treats swelling of the nostrils. ? Rinses that help get rid of thick mucus in your nose (nasal saline washes).  If caused by bacteria, your doctor may wait to see if you will get better without treatment. You may be given antibiotic medicine if you have: ? A very bad infection. ? A weak body defense system.  If caused by growths in the nose, you may need to have surgery. Follow these instructions at home: Medicines  Take, use, or apply over-the-counter and prescription medicines only as told by your doctor. These may include nasal sprays.  If you were prescribed an antibiotic medicine, take it as told by your doctor. Do not stop taking the antibiotic even if you start to feel better. Hydrate and humidify  Drink enough water to keep your pee (urine) pale yellow.  Use a cool mist humidifier to keep the humidity level in your home above 50%.  Breathe in steam for 10-15 minutes, 3-4 times a day, or as told by your doctor. You can do this in the bathroom while a hot shower is running.  Try not to spend time in cool or dry   air.   Rest  Rest as much as you can.  Sleep with your head raised (elevated).  Make sure you get enough sleep each night. General instructions  Put a warm, moist washcloth on your face 3-4 times a day, or as often as told by your doctor. This will help with discomfort.  Wash your hands often with soap and water. If there is no soap and water, use hand sanitizer.  Do not smoke. Avoid being around people who are smoking (secondhand smoke).  Keep all follow-up visits as told by your doctor. This is important.   Contact a doctor  if:  You have a fever.  Your symptoms get worse.  Your symptoms do not get better within 10 days. Get help right away if:  You have a very bad headache.  You cannot stop throwing up (vomiting).  You have very bad pain or swelling around your face or eyes.  You have trouble seeing.  You feel confused.  Your neck is stiff.  You have trouble breathing. Summary  Sinusitis is swelling of your sinuses. Sinuses are hollow spaces in the bones around your face.  This condition is caused by tissues in your nose that become inflamed or swollen. This traps germs. These can lead to infection.  If you were prescribed an antibiotic medicine, take it as told by your doctor. Do not stop taking it even if you start to feel better.  Keep all follow-up visits as told by your doctor. This is important. This information is not intended to replace advice given to you by your health care provider. Make sure you discuss any questions you have with your health care provider. Document Revised: 08/27/2017 Document Reviewed: 08/27/2017 Elsevier Patient Education  2021 [employer]   If you have lab work done today you will be contacted with your lab results within the next 2 weeks.  If you have not heard from us then please contact us. The fastest way to get your results is to register for My Chart.   IF you received an x-ray today, you will receive an invoice from [facility]. Please contact [facility] at [phone] with questions or concerns regarding your invoice.   IF you received labwork today, you will receive an invoice from [facility]. Please contact [facility] at [phone] with questions or concerns regarding your invoice.   Our billing staff will not be able to assist you with questions regarding bills from these companies.  You will be contacted with the lab results as soon as they are available. The fastest way to get your results is to activate your My Chart  account. Instructions are located on the last page of this paperwork. If you have not heard from us regarding the results in 2 weeks, please contact this office.      

## 2020-06-25 ENCOUNTER — Other Ambulatory Visit: Payer: Self-pay

## 2020-06-25 ENCOUNTER — Ambulatory Visit (INDEPENDENT_AMBULATORY_CARE_PROVIDER_SITE_OTHER): Payer: Medicare HMO | Admitting: Family Medicine

## 2020-06-25 DIAGNOSIS — Z0000 Encounter for general adult medical examination without abnormal findings: Secondary | ICD-10-CM | POA: Diagnosis not present

## 2020-06-25 LAB — LIPID PANEL
Chol/HDL Ratio: 3.5 ratio (ref 0.0–4.4)
Cholesterol, Total: 204 mg/dL — ABNORMAL HIGH (ref 100–199)
HDL: 58 mg/dL (ref >39)
LDL Chol Calc (NIH): 127 mg/dL — ABNORMAL HIGH (ref 0–99)
Triglycerides: 106 mg/dL (ref 0–149)
VLDL Cholesterol Cal: 19 mg/dL (ref 5–40)

## 2020-06-25 LAB — CMP14+EGFR
ALT: 12 IU/L (ref 0–32)
AST: 19 IU/L (ref 0–40)
Albumin/Globulin Ratio: 2 (ref 1.2–2.2)
Albumin: 4.2 g/dL (ref 3.8–4.8)
Alkaline Phosphatase: 53 IU/L (ref 44–121)
BUN/Creatinine Ratio: 22 (ref 12–28)
BUN: 20 mg/dL (ref 8–27)
Bilirubin Total: <0.2 mg/dL (ref 0.0–1.2)
CO2: 22 mmol/L (ref 20–29)
Calcium: 9.4 mg/dL (ref 8.7–10.3)
Chloride: 101 mmol/L (ref 96–106)
Creatinine, Ser: 0.9 mg/dL (ref 0.57–1.00)
Globulin, Total: 2.1 g/dL (ref 1.5–4.5)
Glucose: 110 mg/dL — ABNORMAL HIGH (ref 65–99)
Potassium: 4.5 mmol/L (ref 3.5–5.2)
Sodium: 136 mmol/L (ref 134–144)
Total Protein: 6.3 g/dL (ref 6.0–8.5)
eGFR: 70 mL/min/{1.73_m2} (ref >59)

## 2020-06-25 LAB — HEMOGLOBIN A1C
Est. average glucose Bld gHb Est-mCnc: 137 mg/dL
Hgb A1c MFr Bld: 6.4 % — ABNORMAL HIGH (ref 4.8–5.6)

## 2020-07-02 ENCOUNTER — Ambulatory Visit (INDEPENDENT_AMBULATORY_CARE_PROVIDER_SITE_OTHER): Payer: Medicare HMO | Admitting: Family Medicine

## 2020-07-02 ENCOUNTER — Encounter: Payer: Self-pay | Admitting: Family Medicine

## 2020-07-02 ENCOUNTER — Other Ambulatory Visit: Payer: Self-pay

## 2020-07-02 VITALS — BP 136/86 | HR 94 | Temp 98.2°F | Ht 63.5 in | Wt 163.0 lb

## 2020-07-02 DIAGNOSIS — J4520 Mild intermittent asthma, uncomplicated: Secondary | ICD-10-CM

## 2020-07-02 DIAGNOSIS — Z0001 Encounter for general adult medical examination with abnormal findings: Secondary | ICD-10-CM

## 2020-07-02 DIAGNOSIS — R7303 Prediabetes: Secondary | ICD-10-CM

## 2020-07-02 DIAGNOSIS — E538 Deficiency of other specified B group vitamins: Secondary | ICD-10-CM | POA: Diagnosis not present

## 2020-07-02 DIAGNOSIS — G4700 Insomnia, unspecified: Secondary | ICD-10-CM

## 2020-07-02 DIAGNOSIS — E039 Hypothyroidism, unspecified: Secondary | ICD-10-CM

## 2020-07-02 DIAGNOSIS — Z0000 Encounter for general adult medical examination without abnormal findings: Secondary | ICD-10-CM

## 2020-07-02 DIAGNOSIS — F339 Major depressive disorder, recurrent, unspecified: Secondary | ICD-10-CM

## 2020-07-02 DIAGNOSIS — R69 Illness, unspecified: Secondary | ICD-10-CM | POA: Diagnosis not present

## 2020-07-02 DIAGNOSIS — E785 Hyperlipidemia, unspecified: Secondary | ICD-10-CM

## 2020-07-02 MED ORDER — LEVOTHYROXINE SODIUM 75 MCG PO TABS: 75.0000 ug | ORAL_TABLET | Freq: Every day | ORAL | 3 refills | Status: DC

## 2020-07-02 MED ORDER — TEMAZEPAM 15 MG PO CAPS: ORAL_CAPSULE | ORAL | 0 refills | Status: DC

## 2020-07-02 NOTE — Progress Notes (Signed)
Subjective:  Patient ID: Christine Valdez, female    DOB: 1950/10/02  Age: 70 y.o. MRN: [id number]  CC:  Chief Complaint  Patient presents with  . Medicare Wellness    Pt reports she feel well.    HPI Christine Valdez presents for   Annual wellness exam. Life is going well overall.   Care team Primary care provider, me Ophthalmology Dr. Katy Fitch Gynecology Dr. Sabra Heck GI: Dr. Collene Mares  Hypothyroidism: Lab Results  Component Value Date   TSH 1.470 09/24/2019  Synthroid 75 mcg daily - same dose for awhile. Plan on labs next visit.  Taking medication daily.  No new hot or cold intolerance. No new hair or skin changes, heart palpitations or new fatigue. No new weight changes. Trying to exercise.   Mild intermittent asthma: Albuterol as needed, not needed recently. Ragweed trigger in fall but doing well.   B12 deficiency, On B complex - every other day.  Lab Results  Component Value Date   VITAMINB12 1,943 (H) 09/24/2019   Allergic rhinitis Flonase, Astelin nasal spray as needed, Claritin 10 mg qd started  recently with pollen - usually as needed as well.   Prediabetes: Plans increased exercise and diet changes.  Lab Results  Component Value Date   HGBA1C 6.4 (H) 06/25/2020   Wt Readings from Last 3 Encounters:  07/02/20 163 lb (73.9 kg)  06/02/20 159 lb (72.1 kg)  12/09/19 163 lb (73.9 kg)   Hyperlipidemia: Plans diet approach. No current meds.  Lab Results  Component Value Date   CHOL 204 (H) 06/25/2020   HDL 58 06/25/2020   LDLCALC 127 (H) 06/25/2020   TRIG 106 06/25/2020   CHOLHDL 3.5 06/25/2020   Lab Results  Component Value Date   ALT 12 06/25/2020   AST 19 06/25/2020   ALKPHOS 53 06/25/2020   BILITOT <0.2 06/25/2020      Depression: celexa 40mg  qd for some time. Doing well.  Would like to stay at same dose. Insomnia in past. Restoril 30 mg as needed in past. Trouble getting covered with insurance. Uses once or twice per week as needed.  Saw therapist in past.   Depression screen [facility] 2/9 07/02/2020 06/02/2020 09/24/2019 08/20/2019 11/13/2018  Decreased Interest 0 1 0 0 0  Down, Depressed, Hopeless 0 0 0 0 0  PHQ - 2 Score 0 1 0 0 0   Fall Risk  07/02/2020 06/02/2020 11/24/2019 09/24/2019 08/20/2019  Falls in the past year? 0 0 0 0 0  Number falls in past yr: - 0 0 - 0  Injury with Fall? - 0 0 - 0  Follow up Falls evaluation completed Falls evaluation completed Falls evaluation completed Falls evaluation completed Falls evaluation completed  Adequate lighting in home.  No loose rugs.  No stairs  No grab bars in bathroom.   Cancer screening: Colonoscopy 2016 - repeat 5 years. She will call to schedule.  Mammogram 01/2020. Prior dermatologist. No recent visit - plans to schedule. Prior squamous cell removed?  Immunization History  Administered Date(s) Administered  . Fluad Quad(high Dose 65+) 01/03/2019  . PFIZER(Purple Top)SARS-COV-2 Vaccination 04/29/2019, 05/18/2019, 12/16/2019  . Pneumococcal Conjugate-13 02/02/2018  . Pneumococcal Polysaccharide-23 04/21/2014  . Td 11/24/2019  . Tdap 06/24/2013  . Zoster Recombinat (Shingrix) 07/23/2017, 09/21/2017   Functional Status Survey: Is the patient deaf or have difficulty hearing?: No Does the patient have difficulty seeing, even when wearing glasses/contacts?: No Does the patient have difficulty concentrating, remembering, or making decisions?: No Does  the patient have difficulty walking or climbing stairs?: No Does the patient have difficulty dressing or bathing?: No Does the patient have difficulty doing errands alone such as visiting a doctor's office or shopping?: No   6CIT Screen 07/02/2020  What Year? 0 points  What month? 0 points  What time? 0 points  Count back from 20 0 points  Months in reverse 0 points  Repeat phrase 0 points  Total Score 0    [place] Office Visit from 07/02/2020 in Primary Care at [facility]  AUDIT-C Score 1      No exam data  present Ongoing follow up with DR. Groat.   Dental: every 6 months usually - plans to schedule.   Exercise: walking 2-3 days per week.   Advanced directives:  Has living will and HCPOA.   History Patient Active Problem List   Diagnosis Date Noted  . Influenza with respiratory manifestation other than pneumonia 05/05/2018  . Recurrent major depressive disorder, in partial remission ([place]) 09/28/2016  . Hypothyroidism 09/28/2016  . B12 deficiency 09/28/2016  . Hyperglycemia 09/28/2016  . Pruritus ani 06/25/2015  . 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 Anxiety 02/04/2015  . Major depression, recurrent ([place]) 02/04/2015  . Mild intermittent asthma without complication 15/08/6977  . Osteoarthrosis, unspecified whether generalized or localized, shoulder region 07/04/2013   Past Medical History:  Diagnosis Date  . Allergy    uses Flonase daily as needed and takes Loratadine daily   . Anemia    only after birth of 2nd child  . Arthritis   . Asthma    uses Albuterol daily as needed;exercise induced  . 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 Depression    takes Celexa daily  . Depression    Phreesia 07/01/2020  . GERD (gastroesophageal reflux disease)    occ  . History of colon polyps   . History of shingles    5-6 yrs  . Hyperlipidemia   . Hypothyroid   . Insomnia    takes Restoril nightly as needed  . Interstitial cystitis   . Osteoarthritis   . Sjogren's syndrome ([place])   . Spinal headache    after 1st C/S, o2sat low after shoulder surgery after went to room(51%),hard to urinate)  . Thyroid disease    Phreesia 09/21/2019   Past Surgical History:  Procedure Laterality Date  . CARPAL TUNNEL RELEASE Right 05/2007  . CESAREAN SECTION  1981/1984   x 2  . COLONOSCOPY    . JOINT REPLACEMENT N/A    Phreesia 09/21/2019  . TONSILLECTOMY AND ADENOIDECTOMY  age 33  . TOTAL SHOULDER ARTHROPLASTY Right 07/04/2013   DR NORRIS  . TOTAL SHOULDER ARTHROPLASTY Right  07/04/2013   Procedure: RIGHT TOTAL SHOULDER ARTHROPLASTY;  Surgeon: Augustin Schooling, MD;  Location: [facility];  Service: Orthopedics;  Laterality: Right;  . TOTAL SHOULDER ARTHROPLASTY Left 03/12/2015   Procedure: LEFT SHOULDER TOTAL SHOULDER ARTHROPLASTY;  Surgeon: Netta Cedars, MD;  Location: [facility];  Service: Orthopedics;  Laterality: Left;   Allergies  Allergen Reactions  . Diflucan [Fluconazole] Hives    unknown  . Thimerosal Hives  . Mercury Detox [Nutritional Supplements] Hives  . Influenza Vaccine Recombinant Rash    Mercury component and Thierosal in flu vaccine  . Levaquin [Levofloxacin] Rash  . Sulfa Antibiotics Rash   Prior to Admission medications   Medication Sig Start Date End Date Taking? Authorizing Provider  albuterol (PROAIR HFA) 108 (90 Base) MCG/ACT inhaler INHALE 2 PUFFS  INTO THE LUNGS EVERY 6 (SIX) HOURS AS NEEDED FOR SHORTNESS OF BREATH. 09/24/19   Wendie Agreste, MD  azelastine (ASTELIN) 0.1 % nasal spray Place 2 sprays into both nostrils 2 (two) times daily. Use in each nostril as directed 08/20/19   Maximiano Coss, NP  cholecalciferol (VITAMIN D) 1000 UNITS tablet Take 1,000 Units by mouth daily.    [provider]  citalopram (CELEXA) 40 MG tablet Take 1 tablet (40 mg total) by mouth daily. 12/09/19   Megan Salon, MD  docusate sodium (COLACE) 100 MG capsule Take 400 mg by mouth at bedtime.     [provider]  fluticasone (FLONASE) 50 MCG/ACT nasal spray USE 1-2 SPRAYS INTO EACH NOSTRIL ONCE A DAY AS NEEDED 06/05/19   Forrest Moron, MD  levothyroxine (SYNTHROID) 75 MCG tablet Take 1 tablet (75 mcg total) by mouth daily. 09/24/19   Wendie Agreste, MD  loratadine (CLARITIN) 10 MG tablet Take 10 mg by mouth daily as needed for allergies.     [provider]  temazepam (RESTORIL) 30 MG capsule TAKE 1 CAPSULE BY MOUTH EVERY DAY AT BEDTIME AS NEEDED Patient not taking: Reported on 06/02/2020 11/24/19   Wendie Agreste, MD  vitamin B-12  (CYANOCOBALAMIN) 1000 MCG tablet Take 1,000 mcg by mouth every other day.     [provider]   Social History   Socioeconomic History  . Marital status: Married    Spouse name: Not on file  . Number of children: 2  . Years of education: Not on file  . Highest education level: Not on file  Occupational History  . Not on file  Tobacco Use  . Smoking status: Never Smoker  . Smokeless tobacco: Never Used  Vaping Use  . Vaping Use: Never used  Substance and Sexual Activity  . Alcohol use: Yes    Alcohol/week: 2.0 standard drinks    Types: 2 Glasses of wine per week  . Drug use: No  . Sexual activity: Not Currently    Partners: Male    Birth control/protection: Post-menopausal  Other Topics Concern  . Not on file  Social History Narrative   Work or School: nanny for multiple families      Home Situation: son a Theme park manager      Spiritual Beliefs: Christian      Lifestyle: no regular exercise, diet not great   Social Determinants of Health   Financial Resource Strain: Not on file  Food Insecurity: Not on file  Transportation Needs: Not on file  Physical Activity: Not on file  Stress: Not on file  Social Connections: Not on file  Intimate Partner Violence: Not on file    Review of Systems 13 point review of systems per patient health survey noted.  Negative other than as indicated above or in HPI.    Objective:   Vitals:   07/02/20 1434  BP: (!) 147/89  Pulse: 94  Temp: 98.2 F (36.8 C)  TempSrc: Temporal  SpO2: 97%  Weight: 163 lb (73.9 kg)  Height: 5' 3.5" (1.613 m)     Physical Exam Vitals reviewed.  Constitutional:      Appearance: She is well-developed.  HENT:     Head: Normocephalic and atraumatic.     Right Ear: External ear normal.     Left Ear: External ear normal.  Eyes:     Conjunctiva/sclera: Conjunctivae normal.     Pupils: Pupils are equal, round, and reactive to light.  Neck:  Thyroid: No thyromegaly.  Cardiovascular:      Rate and Rhythm: Normal rate and regular rhythm.     Heart sounds: Normal heart sounds. No murmur heard.   Pulmonary:     Effort: Pulmonary effort is normal. No respiratory distress.     Breath sounds: Normal breath sounds. No wheezing.  Abdominal:     General: Bowel sounds are normal.     Palpations: Abdomen is soft.     Tenderness: There is no abdominal tenderness.  Musculoskeletal:        General: No tenderness. Normal range of motion.     Cervical back: Normal range of motion and neck supple.  Lymphadenopathy:     Cervical: No cervical adenopathy.  Skin:    General: Skin is warm and dry.     Findings: No rash.  Neurological:     General: No focal deficit present.     Mental Status: She is alert and oriented to person, place, and time.  Psychiatric:        Mood and Affect: Mood normal.        Behavior: Behavior normal.        Thought Content: Thought content normal.        Assessment & Plan:  Christine Valdez is a 70 y.o. female . Medicare annual wellness visit, subsequent  Mild intermittent asthma without complication  Hypothyroidism, unspecified type - Plan: levothyroxine (SYNTHROID) 75 MCG tablet  B12 deficiency  Dyslipidemia  Insomnia, unspecified type - Plan: temazepam (RESTORIL) 15 MG capsule  Recurrent major depressive disorder, remission status unspecified ([place])  Prediabetes  - anticipatory guidance as below in AVS, screening labs if needed. Health maintenance items as above in HPI discussed/recommended as applicable.  - no concerning responses on depression, fall, or functional status screening. Any positive responses noted as above. Advanced directives discussed as in CHL.   -Plans diet/exercise approach for prediabetes, hyperlipidemia treatment initially.  -Option of lower dose Restoril for sleep if needed, may need to look at other options if not covered.  -Option of lower dose of citalopram if well controlled depression, but will continue  same dose for now.  39-month follow-up  Meds ordered this encounter  Medications  . temazepam (RESTORIL) 15 MG capsule    Sig: TAKE 1 CAPSULE BY MOUTH EVERY DAY AT BEDTIME AS NEEDED    Dispense:  30 capsule    Refill:  0    This request is for a new prescription for a controlled substance as required by Federal/State law.  . levothyroxine (SYNTHROID) 75 MCG tablet    Sig: Take 1 tablet (75 mcg total) by mouth daily.    Dispense:  90 tablet    Refill:  3   Patient Instructions   My new practice: [facility] Address: [address], [place], [place] [address] Phone: [phone]  Try lower dose of restoril if needed for sleep. If not covered, let me know and we can discuss alternatives.   Let me know if other refills needed.   We have the option to decrease citalopram to 20mg  per day if you are doing well. Also ok to remain on 40mg  per day.   Call dermatology for appointment and gastroenterology for updated colonoscopy.   Pneumovax may be able to be given at your pharmacy.   Continue walking, watch diet, recheck labs in 6 months.   Return to the clinic or go to the nearest emergency room if any of your symptoms worsen or  new symptoms occur.    Diabetes Care, 44(Suppl 1), V76-H60. https://doi.org/https://doi.org/10.2337/dc21-S003">  Prediabetes Prediabetes is when your blood sugar (blood glucose) level is higher than normal but not high enough for you to be diagnosed with type 2 diabetes. Having prediabetes puts you at risk for developing type 2 diabetes (type 2 diabetes mellitus). With certain lifestyle changes, you may be able to prevent or delay the onset of type 2 diabetes. This is important because type 2 diabetes can lead to serious complications, such as:  Heart disease.  Stroke.  Blindness.  Kidney disease.  Depression.  Poor circulation in the feet and legs. In severe cases, this could lead to surgical removal of a leg (amputation). What are  the causes? The exact cause of prediabetes is not known. It may result from insulin resistance. Insulin resistance develops when cells in the body do not respond properly to insulin that the body makes. This can cause excess glucose to build up in the blood. High blood glucose (hyperglycemia) can develop. What increases the risk? The following factors may make you more likely to develop this condition:  You have a family member with type 2 diabetes.  You are older than 45 years.  You had a temporary form of diabetes during a pregnancy (gestational diabetes).  You had polycystic ovary syndrome (PCOS).  You are overweight or obese.  You are inactive (sedentary).  You have a history of heart disease, including problems with cholesterol levels, high levels of blood fats, or high blood pressure. What are the signs or symptoms? You may have no symptoms. If you do have symptoms, they may include:  Increased hunger.  Increased thirst.  Increased urination.  Vision changes, such as blurry vision.  Tiredness (fatigue). How is this diagnosed? This condition can be diagnosed with blood tests. Your blood glucose may be checked with one or more of the following tests:  A fasting blood glucose (FBG) test. You will not be allowed to eat (you will fast) for at least 8 hours before a blood sample is taken.  An A1C blood test (hemoglobin A1C). This test provides information about blood glucose levels over the previous 2?3 months.  An oral glucose tolerance test (OGTT). This test measures your blood glucose at two points in time: ? After fasting. This is your baseline level. ? Two hours after you drink a beverage that contains glucose. You may be diagnosed with prediabetes if:  Your FBG is 100?125 mg/dL (5.6-6.9 mmol/L).  Your A1C level is 5.7?6.4% (39-46 mmol/mol).  Your OGTT result is 140?199 mg/dL (7.8-11 mmol/L). These blood tests may be repeated to confirm your diagnosis.   How is  this treated? Treatment may include dietary and lifestyle changes to help lower your blood glucose and prevent type 2 diabetes from developing. In some cases, medicine may be prescribed to help lower the risk of type 2 diabetes. Follow these instructions at home: Nutrition  Follow a healthy meal plan. This includes eating lean proteins, whole grains, legumes, fresh fruits and vegetables, low-fat dairy products, and healthy fats.  Follow instructions from your health care provider about eating or drinking restrictions.  Meet with a dietitian to create a healthy eating plan that is right for you.   Lifestyle  Do moderate-intensity exercise for at least 30 minutes a day on 5 or more days each week, or as told by your health care provider. A mix of activities may be best, such as: ? Brisk walking, swimming, biking, and weight lifting.  Lose weight as told by your health care provider. Losing 5-7% of your body weight can reverse insulin resistance.  Do not drink alcohol if: ? Your health care provider tells you not to drink. ? You are pregnant, may be pregnant, or are planning to become pregnant.  If you drink alcohol: ? Limit how much you use to:  0-1 drink a day for women.  0-2 drinks a day for men. ? Be aware of how much alcohol is in your drink. In the [place] one drink equals one 12 oz bottle of beer (355 mL), one 5 oz glass of wine (148 mL), or one 1 oz glass of hard liquor (44 mL). General instructions  Take over-the-counter and prescription medicines only as told by your health care provider. You may be prescribed medicines that help lower the risk of type 2 diabetes.  Do not use any products that contain nicotine or tobacco, such as cigarettes, e-cigarettes, and chewing tobacco. If you need help quitting, ask your health care provider.  Keep all follow-up visits. This is important. Where to find more information  American Diabetes Association: www.diabetes.org  Academy of  Nutrition and Dietetics: www.eatright.org  American Heart Association: www.heart.org Contact a health care provider if:  You have any of these symptoms: ? Increased hunger. ? Increased urination. ? Increased thirst. ? Fatigue. ? Vision changes, such as blurry vision. Get help right away if you:  Have shortness of breath.  Feel confused.  Vomit or feel like you may vomit. Summary  Prediabetes is when your blood sugar (blood glucose)level is higher than normal but not high enough for you to be diagnosed with type 2 diabetes.  Having prediabetes puts you at risk for developing type 2 diabetes (type 2 diabetes mellitus).  Make lifestyle changes such as eating a healthy diet and exercising regularly to help prevent diabetes. Lose weight as told by your health care provider. This information is not intended to replace advice given to you by your health care provider. Make sure you discuss any questions you have with your health care provider. Document Revised: 06/26/2019 Document Reviewed: 06/26/2019 Elsevier Patient Education  [place] Maintenance After Age 56 After age 53, you are at a higher risk for certain long-term diseases and infections as well as injuries from falls. Falls are a major cause of broken bones and head injuries in people who are older than age 30. Getting regular preventive care can help to keep you healthy and well. Preventive care includes getting regular testing and making lifestyle changes as recommended by your health care provider. Talk with your health care provider about:  Which screenings and tests you should have. A screening is a test that checks for a disease when you have no symptoms.  A diet and exercise plan that is right for you. What should I know about screenings and tests to prevent falls? Screening and testing are the best ways to find a health problem early. Early diagnosis and treatment give you the best chance of managing  medical conditions that are common after age 80. Certain conditions and lifestyle choices may make you more likely to have a fall. Your health care provider may recommend:  Regular vision checks. Poor vision and conditions such as cataracts can make you more likely to have a fall. If you wear glasses, make sure to get your prescription updated if your vision changes.  Medicine review. Work with your health care provider to regularly review all of  the medicines you are taking, including over-the-counter medicines. Ask your health care provider about any side effects that may make you more likely to have a fall. Tell your health care provider if any medicines that you take make you feel dizzy or sleepy.  Osteoporosis screening. Osteoporosis is a condition that causes the bones to get weaker. This can make the bones weak and cause them to break more easily.  Blood pressure screening. Blood pressure changes and medicines to control blood pressure can make you feel dizzy.  Strength and balance checks. Your health care provider may recommend certain tests to check your strength and balance while standing, walking, or changing positions.  Foot health exam. Foot pain and numbness, as well as not wearing proper footwear, can make you more likely to have a fall.  Depression screening. You may be more likely to have a fall if you have a fear of falling, feel emotionally low, or feel unable to do activities that you used to do.  Alcohol use screening. Using too much alcohol can affect your balance and may make you more likely to have a fall. What actions can I take to lower my risk of falls? General instructions  Talk with your health care provider about your risks for falling. Tell your health care provider if: ? You fall. Be sure to tell your health care provider about all falls, even ones that seem minor. ? You feel dizzy, sleepy, or off-balance.  Take over-the-counter and prescription medicines only  as told by your health care provider. These include any supplements.  Eat a healthy diet and maintain a healthy weight. A healthy diet includes low-fat dairy products, low-fat (lean) meats, and fiber from whole grains, beans, and lots of fruits and vegetables. Home safety  Remove any tripping hazards, such as rugs, cords, and clutter.  Install safety equipment such as grab bars in bathrooms and safety rails on stairs.  Keep rooms and walkways well-lit. Activity  Follow a regular exercise program to stay fit. This will help you maintain your balance. Ask your health care provider what types of exercise are appropriate for you.  If you need a cane or walker, use it as recommended by your health care provider.  Wear supportive shoes that have nonskid soles.   Lifestyle  Do not drink alcohol if your health care provider tells you not to drink.  If you drink alcohol, limit how much you have: ? 0-1 drink a day for women. ? 0-2 drinks a day for men.  Be aware of how much alcohol is in your drink. In the [place].S., one drink equals one typical bottle of beer (12 oz), one-half glass of wine (5 oz), or one shot of hard liquor (1 oz).  Do not use any products that contain nicotine or tobacco, such as cigarettes and e-cigarettes. If you need help quitting, ask your health care provider. Summary  Having a healthy lifestyle and getting preventive care can help to protect your health and wellness after age 44.  Screening and testing are the best way to find a health problem early and help you avoid having a fall. Early diagnosis and treatment give you the best chance for managing medical conditions that are more common for people who are older than age 74.  Falls are a major cause of broken bones and head injuries in people who are older than age 23. Take precautions to prevent a fall at home.  Work with your health care provider to learn  what changes you can make to improve your health and wellness  and to prevent falls. This information is not intended to replace advice given to you by your health care provider. Make sure you discuss any questions you have with your health care provider. Document Revised: 07/18/2018 Document Reviewed: 02/07/2017 Elsevier Patient Education  2021 [employer].    If you have lab work done today you will be contacted with your lab results within the next 2 weeks.  If you have not heard from [place] then please contact us. The fastest way to get your results is to register for My Chart.   IF you received an x-ray today, you will receive an invoice from [facility]. Please contact [facility] at [phone] with questions or concerns regarding your invoice.   IF you received labwork today, you will receive an invoice from [facility]. Please contact [facility] at [phone] with questions or concerns regarding your invoice.   Our billing staff will not be able to assist you with questions regarding bills from these companies.  You will be contacted with the lab results as soon as they are available. The fastest way to get your results is to activate your My Chart account. Instructions are located on the last page of this paperwork. If you have not heard from [place] regarding the results in 2 weeks, please contact this office.         Signed, Merri Ray, MD [facility]

## 2020-07-02 NOTE — Patient Instructions (Addendum)
My new practice: [facility] Address: [address], [place], [place] [address] Phone: [phone]  Try lower dose of restoril if needed for sleep. If not covered, let me know and we can discuss alternatives.   Let me know if other refills needed.   We have the option to decrease citalopram to 20mg  per day if you are doing well. Also ok to remain on 40mg  per day.   Call dermatology for appointment and gastroenterology for updated colonoscopy.   Pneumovax may be able to be given at your pharmacy.   Continue walking, watch diet, recheck labs in 6 months.   Return to the clinic or go to the nearest emergency room if any of your symptoms worsen or new symptoms occur.    Diabetes Care, 44(Suppl 1), V42-V95. https://doi.org/https://doi.org/10.2337/dc21-S003">  Prediabetes Prediabetes is when your blood sugar (blood glucose) level is higher than normal but not high enough for you to be diagnosed with type 2 diabetes. Having prediabetes puts you at risk for developing type 2 diabetes (type 2 diabetes mellitus). With certain lifestyle changes, you may be able to prevent or delay the onset of type 2 diabetes. This is important because type 2 diabetes can lead to serious complications, such as:  Heart disease.  Stroke.  Blindness.  Kidney disease.  Depression.  Poor circulation in the feet and legs. In severe cases, this could lead to surgical removal of a leg (amputation). What are the causes? The exact cause of prediabetes is not known. It may result from insulin resistance. Insulin resistance develops when cells in the body do not respond properly to insulin that the body makes. This can cause excess glucose to build up in the blood. High blood glucose (hyperglycemia) can develop. What increases the risk? The following factors may make you more likely to develop this condition:  You have a family member with type 2 diabetes.  You are older than 45 years.  You  had a temporary form of diabetes during a pregnancy (gestational diabetes).  You had polycystic ovary syndrome (PCOS).  You are overweight or obese.  You are inactive (sedentary).  You have a history of heart disease, including problems with cholesterol levels, high levels of blood fats, or high blood pressure. What are the signs or symptoms? You may have no symptoms. If you do have symptoms, they may include:  Increased hunger.  Increased thirst.  Increased urination.  Vision changes, such as blurry vision.  Tiredness (fatigue). How is this diagnosed? This condition can be diagnosed with blood tests. Your blood glucose may be checked with one or more of the following tests:  A fasting blood glucose (FBG) test. You will not be allowed to eat (you will fast) for at least 8 hours before a blood sample is taken.  An A1C blood test (hemoglobin A1C). This test provides information about blood glucose levels over the previous 2?3 months.  An oral glucose tolerance test (OGTT). This test measures your blood glucose at two points in time: ? After fasting. This is your baseline level. ? Two hours after you drink a beverage that contains glucose. You may be diagnosed with prediabetes if:  Your FBG is 100?125 mg/dL (5.6-6.9 mmol/L).  Your A1C level is 5.7?6.4% (39-46 mmol/mol).  Your OGTT result is 140?199 mg/dL (7.8-11 mmol/L). These blood tests may be repeated to confirm your diagnosis.   How is this treated? Treatment may include dietary and lifestyle changes to help lower your blood glucose and prevent  type 2 diabetes from developing. In some cases, medicine may be prescribed to help lower the risk of type 2 diabetes. Follow these instructions at home: Nutrition  Follow a healthy meal plan. This includes eating lean proteins, whole grains, legumes, fresh fruits and vegetables, low-fat dairy products, and healthy fats.  Follow instructions from your health care provider about  eating or drinking restrictions.  Meet with a dietitian to create a healthy eating plan that is right for you.   Lifestyle  Do moderate-intensity exercise for at least 30 minutes a day on 5 or more days each week, or as told by your health care provider. A mix of activities may be best, such as: ? Brisk walking, swimming, biking, and weight lifting.  Lose weight as told by your health care provider. Losing 5-7% of your body weight can reverse insulin resistance.  Do not drink alcohol if: ? Your health care provider tells you not to drink. ? You are pregnant, may be pregnant, or are planning to become pregnant.  If you drink alcohol: ? Limit how much you use to:  0-1 drink a day for women.  0-2 drinks a day for men. ? Be aware of how much alcohol is in your drink. In the [place]., one drink equals one 12 oz bottle of beer (355 mL), one 5 oz glass of wine (148 mL), or one 1 oz glass of hard liquor (44 mL). General instructions  Take over-the-counter and prescription medicines only as told by your health care provider. You may be prescribed medicines that help lower the risk of type 2 diabetes.  Do not use any products that contain nicotine or tobacco, such as cigarettes, e-cigarettes, and chewing tobacco. If you need help quitting, ask your health care provider.  Keep all follow-up visits. This is important. Where to find more information  American Diabetes Association: www.diabetes.org  Academy of Nutrition and Dietetics: www.eatright.org  American Heart Association: www.heart.org Contact a health care provider if:  You have any of these symptoms: ? Increased hunger. ? Increased urination. ? Increased thirst. ? Fatigue. ? Vision changes, such as blurry vision. Get help right away if you:  Have shortness of breath.  Feel confused.  Vomit or feel like you may vomit. Summary  Prediabetes is when your blood sugar (blood glucose)level is higher than normal but not high  enough for you to be diagnosed with type 2 diabetes.  Having prediabetes puts you at risk for developing type 2 diabetes (type 2 diabetes mellitus).  Make lifestyle changes such as eating a healthy diet and exercising regularly to help prevent diabetes. Lose weight as told by your health care provider. This information is not intended to replace advice given to you by your health care provider. Make sure you discuss any questions you have with your health care provider. Document Revised: 06/26/2019 Document Reviewed: 06/26/2019 Elsevier Patient Education  [place] Maintenance After Age 34 After age 74, you are at a higher risk for certain long-term diseases and infections as well as injuries from falls. Falls are a major cause of broken bones and head injuries in people who are older than age 47. Getting regular preventive care can help to keep you healthy and well. Preventive care includes getting regular testing and making lifestyle changes as recommended by your health care provider. Talk with your health care provider about:  Which screenings and tests you should have. A screening is a test that checks for a disease when  you have no symptoms.  A diet and exercise plan that is right for you. What should I know about screenings and tests to prevent falls? Screening and testing are the best ways to find a health problem early. Early diagnosis and treatment give you the best chance of managing medical conditions that are common after age 5. Certain conditions and lifestyle choices may make you more likely to have a fall. Your health care provider may recommend:  Regular vision checks. Poor vision and conditions such as cataracts can make you more likely to have a fall. If you wear glasses, make sure to get your prescription updated if your vision changes.  Medicine review. Work with your health care provider to regularly review all of the medicines you are taking, including  over-the-counter medicines. Ask your health care provider about any side effects that may make you more likely to have a fall. Tell your health care provider if any medicines that you take make you feel dizzy or sleepy.  Osteoporosis screening. Osteoporosis is a condition that causes the bones to get weaker. This can make the bones weak and cause them to break more easily.  Blood pressure screening. Blood pressure changes and medicines to control blood pressure can make you feel dizzy.  Strength and balance checks. Your health care provider may recommend certain tests to check your strength and balance while standing, walking, or changing positions.  Foot health exam. Foot pain and numbness, as well as not wearing proper footwear, can make you more likely to have a fall.  Depression screening. You may be more likely to have a fall if you have a fear of falling, feel emotionally low, or feel unable to do activities that you used to do.  Alcohol use screening. Using too much alcohol can affect your balance and may make you more likely to have a fall. What actions can I take to lower my risk of falls? General instructions  Talk with your health care provider about your risks for falling. Tell your health care provider if: ? You fall. Be sure to tell your health care provider about all falls, even ones that seem minor. ? You feel dizzy, sleepy, or off-balance.  Take over-the-counter and prescription medicines only as told by your health care provider. These include any supplements.  Eat a healthy diet and maintain a healthy weight. A healthy diet includes low-fat dairy products, low-fat (lean) meats, and fiber from whole grains, beans, and lots of fruits and vegetables. Home safety  Remove any tripping hazards, such as rugs, cords, and clutter.  Install safety equipment such as grab bars in bathrooms and safety rails on stairs.  Keep rooms and walkways well-lit. Activity  Follow a regular  exercise program to stay fit. This will help you maintain your balance. Ask your health care provider what types of exercise are appropriate for you.  If you need a cane or walker, use it as recommended by your health care provider.  Wear supportive shoes that have nonskid soles.   Lifestyle  Do not drink alcohol if your health care provider tells you not to drink.  If you drink alcohol, limit how much you have: ? 0-1 drink a day for women. ? 0-2 drinks a day for men.  Be aware of how much alcohol is in your drink. In the U.S., one drink equals one typical bottle of beer (12 oz), one-half glass of wine (5 oz), or one shot of hard liquor (1 oz).  Do  not use any products that contain nicotine or tobacco, such as cigarettes and e-cigarettes. If you need help quitting, ask your health care provider. Summary  Having a healthy lifestyle and getting preventive care can help to protect your health and wellness after age 35.  Screening and testing are the best way to find a health problem early and help you avoid having a fall. Early diagnosis and treatment give you the best chance for managing medical conditions that are more common for people who are older than age 72.  Falls are a major cause of broken bones and head injuries in people who are older than age 17. Take precautions to prevent a fall at home.  Work with your health care provider to learn what changes you can make to improve your health and wellness and to prevent falls. This information is not intended to replace advice given to you by your health care provider. Make sure you discuss any questions you have with your health care provider. Document Revised: 07/18/2018 Document Reviewed: 02/07/2017 Elsevier Patient Education  2021 [employer].    If you have lab work done today you will be contacted with your lab results within the next 2 weeks.  If you have not heard from [place] then please contact us. The fastest way to get your  results is to register for My Chart.   IF you received an x-ray today, you will receive an invoice from [facility]. Please contact [facility] at [phone] with questions or concerns regarding your invoice.   IF you received labwork today, you will receive an invoice from [place]. Please contact [facility] at [phone] with questions or concerns regarding your invoice.   Our billing staff will not be able to assist you with questions regarding bills from these companies.  You will be contacted with the lab results as soon as they are available. The fastest way to get your results is to activate your My Chart account. Instructions are located on the last page of this paperwork. If you have not heard from [place] regarding the results in 2 weeks, please contact this office.

## 2020-07-03 ENCOUNTER — Encounter: Payer: Self-pay | Admitting: Family Medicine

## 2020-08-03 ENCOUNTER — Telehealth: Payer: Self-pay | Admitting: Family Medicine

## 2020-08-03 NOTE — Telephone Encounter (Signed)
It may be best to discuss these palpitations further. As long as they have not been recent, then virtual visit may be ok initially.

## 2020-08-03 NOTE — Telephone Encounter (Signed)
Pt called in stating that after getting the 2nd covid shot and the boost she had want she described at heart flutters. She wanted to know if Dr.Greene thought she should get the 2nd booster.   Please advise   She said that she is fine now

## 2020-08-05 NOTE — Telephone Encounter (Signed)
Pt needs an appointment per Christine Valdez. If palps are not recent then Vritual  "It may be best to discuss these palpitations further. As long as they have not been recent, then virtual visit may be ok initially."

## 2020-08-06 NOTE — Telephone Encounter (Signed)
I have LM asking pt to back to schedule an appt when ready...ELEA

## 2020-08-09 ENCOUNTER — Encounter: Payer: Self-pay | Admitting: Family Medicine

## 2020-08-09 ENCOUNTER — Telehealth (INDEPENDENT_AMBULATORY_CARE_PROVIDER_SITE_OTHER): Payer: Medicare HMO | Admitting: Family Medicine

## 2020-08-09 DIAGNOSIS — R002 Palpitations: Secondary | ICD-10-CM | POA: Diagnosis not present

## 2020-08-09 NOTE — Progress Notes (Signed)
Virtual Visit via audio Note (failed connection x 3 on video)  I connected with [name] on 08/09/20 at 1:18 PM by a video enabled telemedicine application and verified that I am speaking with the correct person using two identifiers.  Patient location: home - dtr's house.  My location: office - [place]    I discussed the limitations, risks, security and privacy concerns of performing an evaluation and management service by telephone and the availability of in person appointments. I also discussed with the patient that there may be a patient responsible charge related to this service. The patient expressed understanding and agreed to proceed, consent obtained  Chief complaint:  Chief Complaint  Patient presents with  . Palpitations    Pt reports has had Palpitations in the past, pt is seeking advice after 2nd COVID vaccine and after her booster dose noted she got fluttering feeling in her chest. Notes this was a marked side effect of COVID vaccine. Pt notes these are gone no concerns about it, pt main concern is that she would like to know if the 4th dose (2nd booster) is appropriate for her to get.      History of Present Illness: Christine Valdez is a 70 y.o. female  Palpitations: Noted after second covid vaccine and booster dose. [employer], doses on 04/29/19, 05/18/19, 12/16/19). Noticed day after injection. Heart fluttering/quivering feeling. Less than a minute, off and on throughout the day, only lasted a day or two.  No syncope/nearsyncopal symptoms.  No chest pain.  No prior heart palpitations, and not since a day or so after last booster.   Lab Results  Component Value Date   WBC 3.7 (A) 04/30/2017   HGB 12.8 04/30/2017   HCT 37.8 04/30/2017   MCV 87.6 04/30/2017   PLT 179.0 05/22/2016   Lab Results  Component Value Date   TSH 1.470 09/24/2019     Patient Active Problem List   Diagnosis Date Noted  . Influenza with respiratory manifestation other  than pneumonia 05/05/2018  . Recurrent major depressive disorder, in partial remission ([place]) 09/28/2016  . Hypothyroidism 09/28/2016  . B12 deficiency 09/28/2016  . Hyperglycemia 09/28/2016  . Pruritus ani 06/25/2015  . 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 Anxiety 02/04/2015  . Major depression, recurrent ([place]) 02/04/2015  . Mild intermittent asthma without complication 35/57/3220  . Osteoarthrosis, unspecified whether generalized or localized, shoulder region 07/04/2013   Past Medical History:  Diagnosis Date  . Allergy    uses Flonase daily as needed and takes Loratadine daily   . Anemia    only after birth of 2nd child  . Arthritis   . Asthma    uses Albuterol daily as needed;exercise induced  . 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 Depression    takes Celexa daily  . Depression    Phreesia 07/01/2020  . GERD (gastroesophageal reflux disease)    occ  . History of colon polyps   . History of shingles    5-6 yrs  . Hyperlipidemia   . Hypothyroid   . Insomnia    takes Restoril nightly as needed  . Interstitial cystitis   . Osteoarthritis   . Sjogren's syndrome ([place])   . Spinal headache    after 1st C/S, o2sat low after shoulder surgery after went to room(51%),hard to urinate)  . Thyroid disease    Phreesia 09/21/2019   Past Surgical History:  Procedure Laterality Date  . CARPAL TUNNEL RELEASE Right 05/2007  .  CESAREAN SECTION  1981/1984   x 2  . COLONOSCOPY    . JOINT REPLACEMENT N/A    Phreesia 09/21/2019  . TONSILLECTOMY AND ADENOIDECTOMY  age 38  . TOTAL SHOULDER ARTHROPLASTY Right 07/04/2013   DR NORRIS  . TOTAL SHOULDER ARTHROPLASTY Right 07/04/2013   Procedure: RIGHT TOTAL SHOULDER ARTHROPLASTY;  Surgeon: Augustin Schooling, MD;  Location: [place];  Service: Orthopedics;  Laterality: Right;  . TOTAL SHOULDER ARTHROPLASTY Left 03/12/2015   Procedure: LEFT SHOULDER TOTAL SHOULDER ARTHROPLASTY;  Surgeon: Netta Cedars, MD;  Location: [facility];  Service:  Orthopedics;  Laterality: Left;   Allergies  Allergen Reactions  . Diflucan [Fluconazole] Hives    unknown  . Thimerosal Hives  . Mercury Detox [Nutritional Supplements] Hives  . Influenza Vaccine Recombinant Rash    Mercury component and Thierosal in flu vaccine  . Levaquin [Levofloxacin] Rash  . Sulfa Antibiotics Rash   Prior to Admission medications   Medication Sig Start Date End Date Taking? Authorizing Provider  albuterol (PROAIR HFA) 108 (90 Base) MCG/ACT inhaler INHALE 2 PUFFS INTO THE LUNGS EVERY 6 (SIX) HOURS AS NEEDED FOR SHORTNESS OF BREATH. 09/24/19  Yes Wendie Agreste, MD  azelastine (ASTELIN) 0.1 % nasal spray Place 2 sprays into both nostrils 2 (two) times daily. Use in each nostril as directed 08/20/19  Yes Maximiano Coss, NP  cholecalciferol (VITAMIN D) 1000 UNITS tablet Take 1,000 Units by mouth daily.   Yes [provider]  citalopram (CELEXA) 40 MG tablet Take 1 tablet (40 mg total) by mouth daily. 12/09/19  Yes Megan Salon, MD  docusate sodium (COLACE) 100 MG capsule Take 400 mg by mouth at bedtime.    Yes [provider]  fluticasone (FLONASE) 50 MCG/ACT nasal spray USE 1-2 SPRAYS INTO EACH NOSTRIL ONCE A DAY AS NEEDED 06/05/19  Yes Delia Chimes A, MD  levothyroxine (SYNTHROID) 75 MCG tablet Take 1 tablet (75 mcg total) by mouth daily. 07/02/20  Yes Wendie Agreste, MD  loratadine (CLARITIN) 10 MG tablet Take 10 mg by mouth daily as needed for allergies.    Yes [provider]  temazepam (RESTORIL) 15 MG capsule TAKE 1 CAPSULE BY MOUTH EVERY DAY AT BEDTIME AS NEEDED 07/02/20  Yes Wendie Agreste, MD  vitamin B-12 (CYANOCOBALAMIN) 1000 MCG tablet Take 1,000 mcg by mouth every other day.    Yes [provider]   Social History   Socioeconomic History  . Marital status: Married    Spouse name: Not on file  . Number of children: 2  . Years of education: Not on file  . Highest education level: Not on file  Occupational  History  . Not on file  Tobacco Use  . Smoking status: Never Smoker  . Smokeless tobacco: Never Used  Vaping Use  . Vaping Use: Never used  Substance and Sexual Activity  . Alcohol use: Yes    Alcohol/week: 2.0 standard drinks    Types: 2 Glasses of wine per week  . Drug use: No  . Sexual activity: Not Currently    Partners: Male    Birth control/protection: Post-menopausal  Other Topics Concern  . Not on file  Social History Narrative   Work or School: nanny for multiple families      Home Situation: son a Theme park manager      Spiritual Beliefs: Christian      Lifestyle: no regular exercise, diet not great   Social Determinants of Health   Financial Resource Strain: Not  on file  Food Insecurity: Not on file  Transportation Needs: Not on file  Physical Activity: Not on file  Stress: Not on file  Social Connections: Not on file  Intimate Partner Violence: Not on file    Observations/Objective: There were no vitals filed for this visit. Speaking in full sentences, no distress, normal respiratory effort.  Denies current palpitations.  All questions were answered with understanding of plan expressed.  Assessment and Plan: Palpitation Noted only after second and third COVID vaccines, temporary symptoms lasting only approximately 1 day.   Intermittent symptoms when she had them.  No associated near syncope, chest pain, or other symptoms.  She does have hypothyroidism but that has been controlled for many years.  Denies recurrence of palpitations after 1 to 2 days from last booster.  Discussed it would likely be okay for her to get another booster as minimal symptoms that resolved fairly quickly.  I will check into other recommendations and let her know if I find something different.  Follow Up Instructions: As needed.   I discussed the assessment and treatment plan with the patient. The patient was provided an opportunity to ask questions and all were answered. The patient agreed  with the plan and demonstrated an understanding of the instructions.   The patient was advised to call back or seek an in-person evaluation if the symptoms worsen or if the condition fails to improve as anticipated.  I provided 12 minutes of non-face-to-face time during this encounter.   Wendie Agreste, MD

## 2020-08-09 NOTE — Telephone Encounter (Signed)
I'm returning this message back to your work queue to avoid miscommunication at your current office. This landed in [place]'s basket.  Thank you!

## 2020-08-11 IMAGING — [facility] DIGITAL DIAGNOSTIC UNILATERAL LEFT MAMMOGRAM WITH TOMO AND CAD
4 series · 4 of 12 positions shown · non-contrast
Comparison: November 05, 2018 and earlier priors

CLINICAL DATA: 68-year-old patient recalled from recent screening
mammogram for evaluation of a possible left breast mass.

EXAM:
DIGITAL DIAGNOSTIC UNILATERAL LEFT MAMMOGRAM WITH CAD AND TOMO

[L MLO synth-2D]
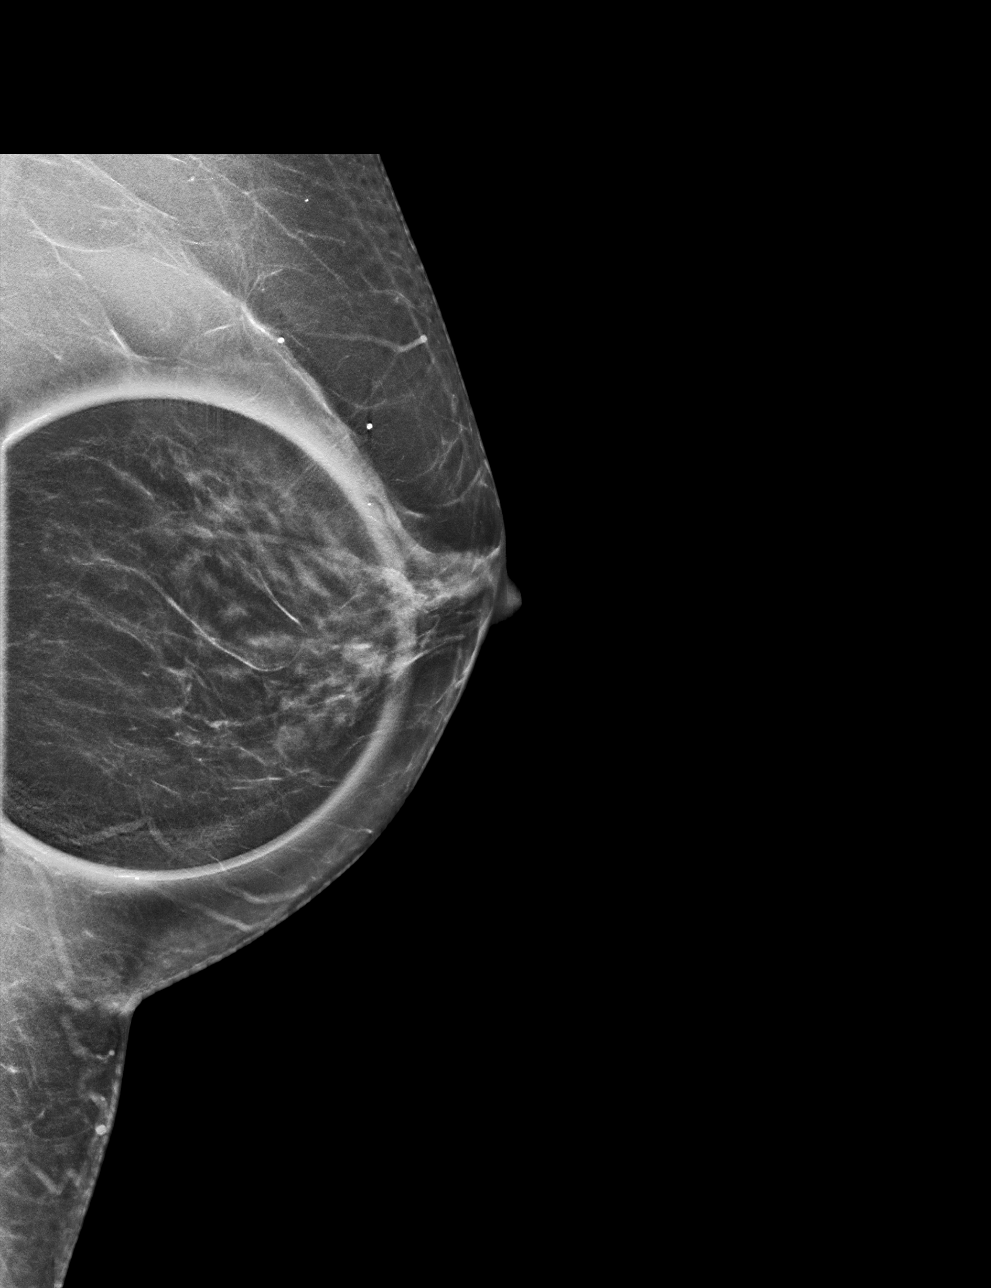

[L CC synth-2D]
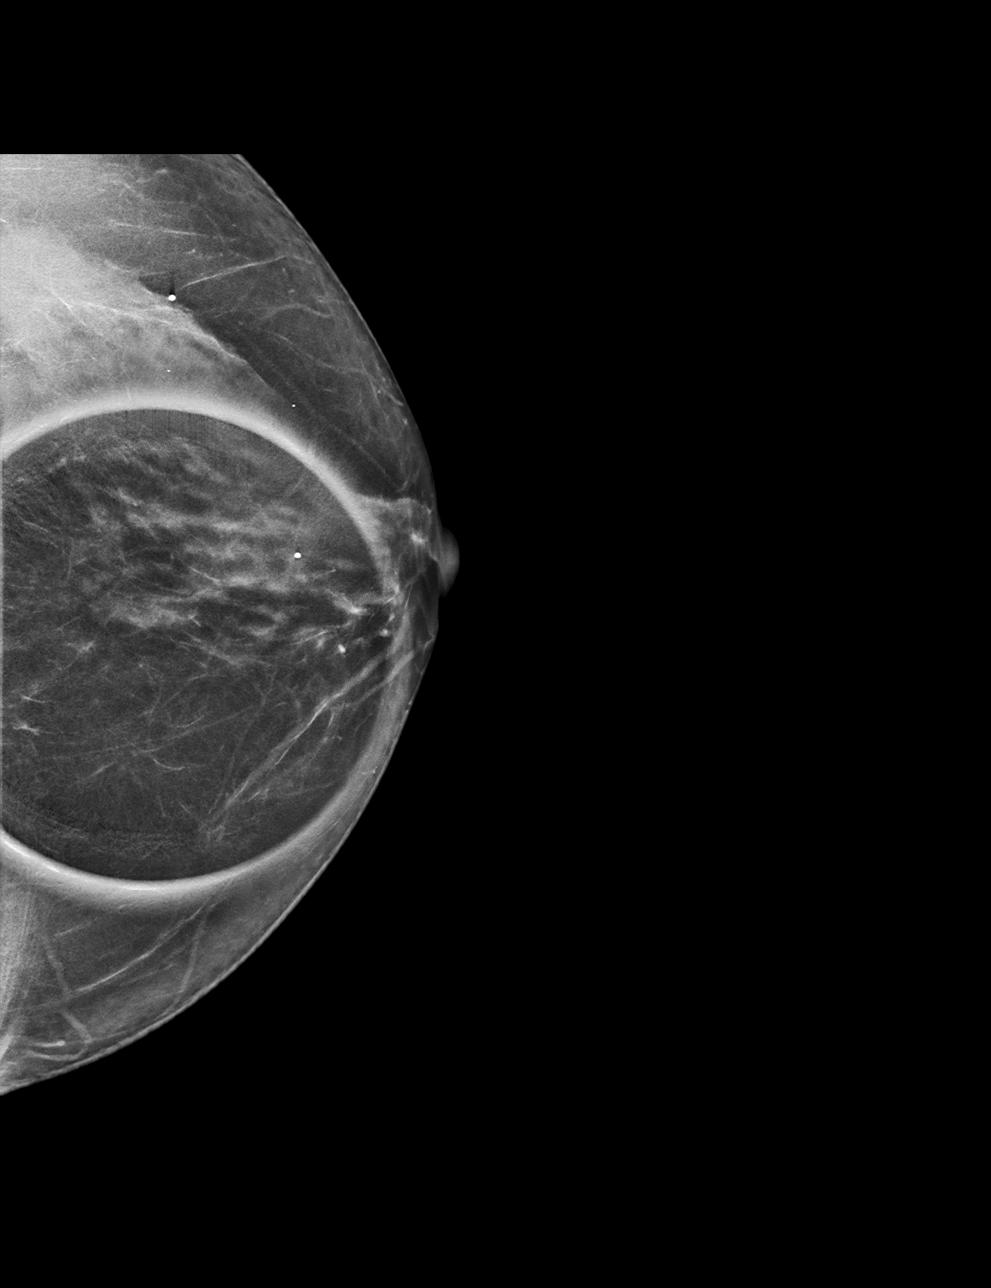

[L MLO tomo · tomo slice 29/57.0]
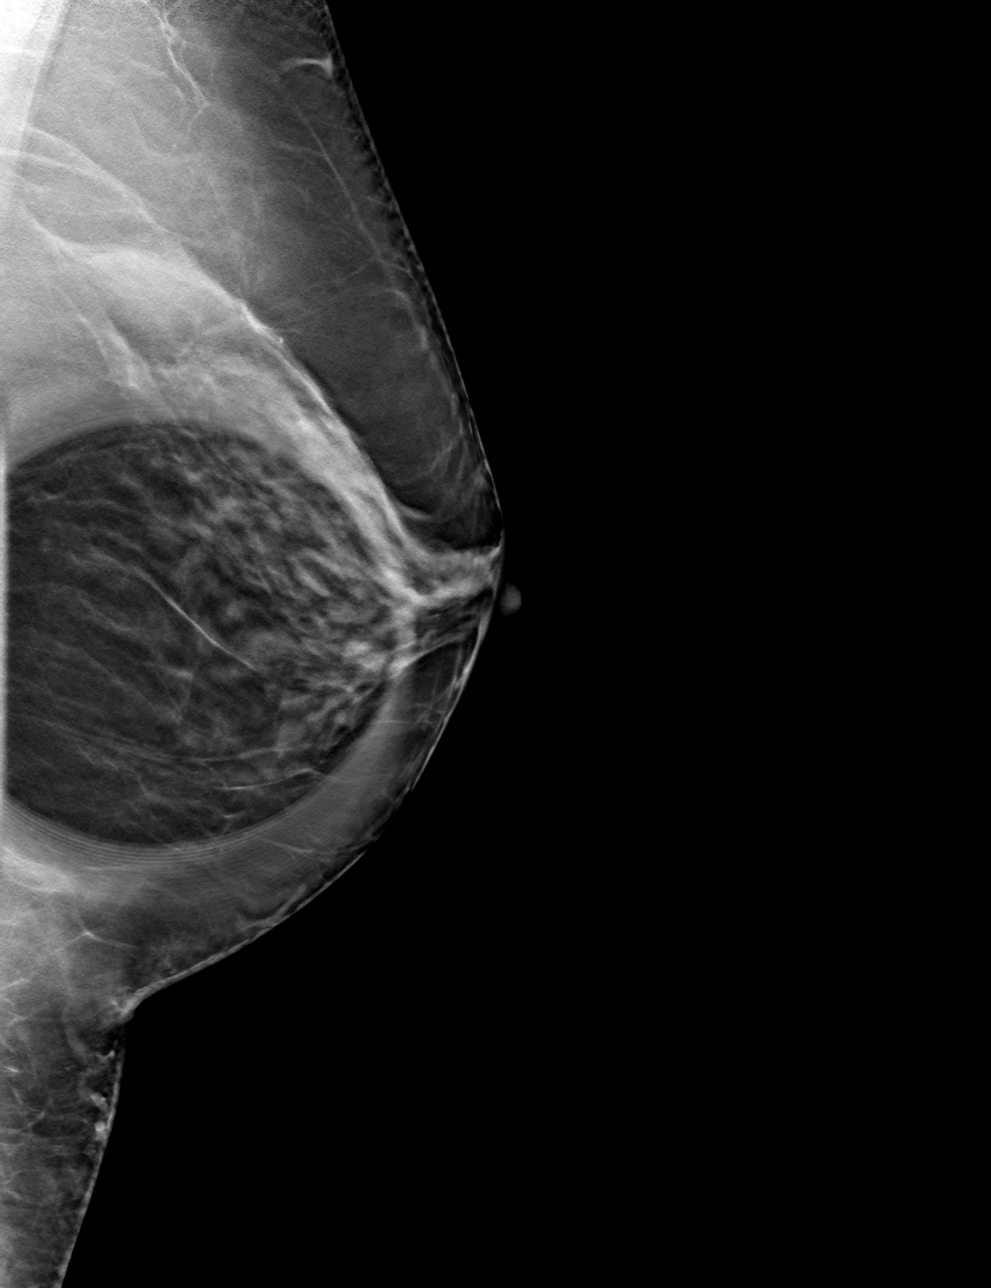

[L CC tomo · tomo slice 29/57.0]
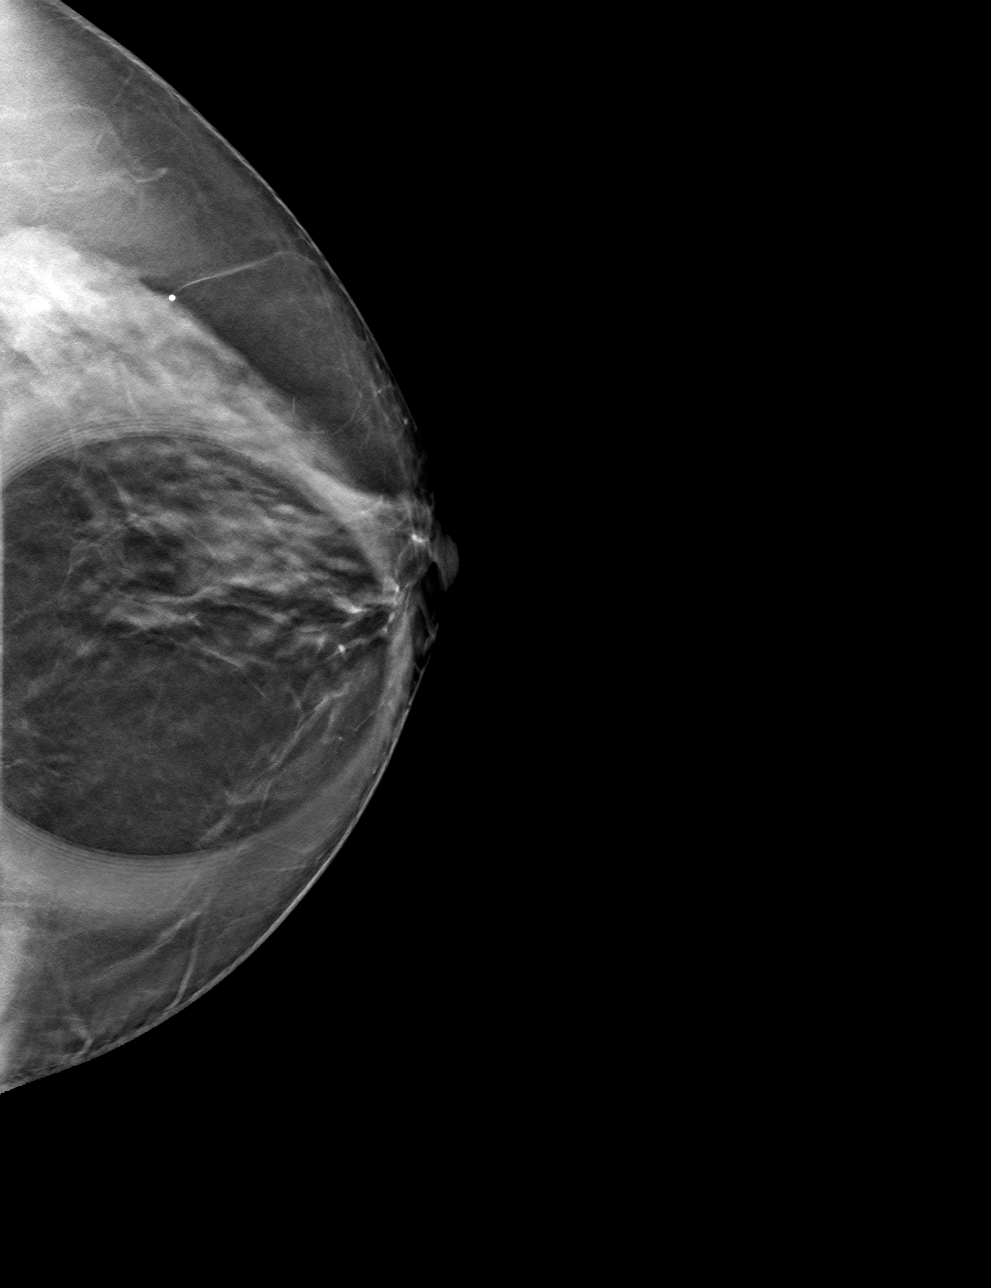

[4 of 12 positions shown; findings below may reference images not displayed]

ACR Breast Density Category c: The breast tissue is heterogeneously
dense, which may obscure small masses.
FINDINGS: Spot compression views with tomography of the left breast in the CC
and MLO projection show dispersion of fibroglandular tissue without
a mass or distortion. The parenchymal pattern appears stable
compared to prior mammograms. No suspicious findings.

Mammographic images were processed with CAD.
IMPRESSION: No evidence of malignancy in the left breast.

RECOMMENDATION:
Screening mammogram in one year.(Code:DL-[id number])

I have discussed the findings and recommendations with the patient.
Results were also provided in writing at the conclusion of the
visit. If applicable, a reminder letter will be sent to the patient
regarding the next appointment.

BI-RADS CATEGORY  1: Negative.

## 2020-08-19 ENCOUNTER — Telehealth: Payer: Self-pay | Admitting: Family Medicine

## 2020-08-19 NOTE — Telephone Encounter (Signed)
Pt called in to f/up from visit with Carlota Raspberry on 08/09/20 to see if he found out and information about the covid booster for her   Per office note  I will check into other recommendations and let her know if I find something different.  Please advise and pt can be reached at the home #

## 2020-09-30 ENCOUNTER — Telehealth: Payer: Self-pay

## 2020-09-30 ENCOUNTER — Telehealth (INDEPENDENT_AMBULATORY_CARE_PROVIDER_SITE_OTHER): Payer: Medicare HMO | Admitting: Registered Nurse

## 2020-09-30 ENCOUNTER — Other Ambulatory Visit: Payer: Self-pay

## 2020-09-30 ENCOUNTER — Encounter: Payer: Self-pay | Admitting: Registered Nurse

## 2020-09-30 DIAGNOSIS — Z1389 Encounter for screening for other disorder: Secondary | ICD-10-CM

## 2020-09-30 DIAGNOSIS — J321 Chronic frontal sinusitis: Secondary | ICD-10-CM

## 2020-09-30 DIAGNOSIS — R0981 Nasal congestion: Secondary | ICD-10-CM | POA: Diagnosis not present

## 2020-09-30 DIAGNOSIS — Z1211 Encounter for screening for malignant neoplasm of colon: Secondary | ICD-10-CM

## 2020-09-30 MED ORDER — AZELASTINE HCL 0.1 % NA SOLN: 1.0000 | Freq: Two times a day (BID) | NASAL | 12 refills | Status: DC

## 2020-09-30 MED ORDER — AMOXICILLIN-POT CLAVULANATE 875-125 MG PO TABS: 1.0000 | ORAL_TABLET | Freq: Two times a day (BID) | ORAL | 0 refills | Status: DC

## 2020-09-30 NOTE — Patient Instructions (Signed)
° ° ° °  If you have lab work done today you will be contacted with your lab results within the next 2 weeks.  If you have not heard from us then please contact us. The fastest way to get your results is to register for My Chart. ° ° °IF you received an x-ray today, you will receive an invoice from [facility]. Please contact  Radiology at [phone] with questions or concerns regarding your invoice.  ° °IF you received labwork today, you will receive an invoice from [facility]. Please contact [facility] at [phone] with questions or concerns regarding your invoice.  ° °Our billing staff will not be able to assist you with questions regarding bills from these companies. ° °You will be contacted with the lab results as soon as they are available. The fastest way to get your results is to activate your My Chart account. Instructions are located on the last page of this paperwork. If you have not heard from us regarding the results in 2 weeks, please contact this office. °  ° ° ° °

## 2020-09-30 NOTE — Telephone Encounter (Signed)
Patient states that Dr. Carlota Raspberry has advised her to have her routine colonoscopy with Dr. Collene Mares and a routine follow up with Dr. Renda Rolls [facility].  Patient states she has reached out to her insurance company and they have advised for Dr. Carlota Raspberry to still enter a referral to each office even though each visit is routine.    Patient is requesting referrals to be entered.

## 2020-09-30 NOTE — Progress Notes (Signed)
Telemedicine Encounter- SOAP NOTE Established Patient  This telephone encounter was conducted with the patient's (or proxy's) verbal consent via audio telecommunications: yes/no: Yes Patient was instructed to have this encounter in a suitably private space; and to only have persons present to whom they give permission to participate. In addition, patient identity was confirmed by use of name plus two identifiers (DOB and address).  I discussed the limitations, risks, security and privacy concerns of performing an evaluation and management service by telephone and the availability of in person appointments. I also discussed with the patient that there may be a patient responsible charge related to this service. The patient expressed understanding and agreed to proceed.  I spent a total of TIME; 0 MIN TO 60 MIN: 15 minutes talking with the patient or their proxy.  Patient at home Provider in office  Participants: Kathrin Ruddy, NP and [name]  Chief Complaint  Patient presents with   Sinusitis    Patient states she has been having a really bad sinus infection. Patient states she has been having some ear pain, face pain, eyes feel like they are going to pop out, fatigue and no energy she has took covid test but it was negative.She has been taking otc medications with no relief.    Subjective   Christine Valdez is a 70 y.o. established patient. Telephone visit today for sinus pressure  HPI Onset around 7-10 days ago Sinus pressure, ear pressure, frontal headache Now having some fatigue, malaise Some mild cough from PND  No lower respiratory symptoms Denies shob, doe, chest pain, lightheadedness  Has taken sudafed, saline rinse, flonase Ibuprofen for discomfort Rest Symptoms progressing despite this  Has been vaccinated and boosted COVID test negative around 1 week ago  Patient Active Problem List   Diagnosis Date Noted   Influenza with respiratory  manifestation other than pneumonia 05/05/2018   Recurrent major depressive disorder, in partial remission ([place]) 09/28/2016   Hypothyroidism 09/28/2016   B12 deficiency 09/28/2016   Hyperglycemia 09/28/2016   Pruritus ani 06/25/2015   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   Anxiety 02/04/2015   Major depression, recurrent ([place]) 02/04/2015   Mild intermittent asthma without complication 62/22/9798   Osteoarthrosis, unspecified whether generalized or localized, shoulder region 07/04/2013    Past Medical History:  Diagnosis Date   Allergy    uses Flonase daily as needed and takes Loratadine daily    Anemia    only after birth of 2nd child   Arthritis    Asthma    uses Albuterol daily as needed;exercise induced   Cancer ([place]) 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, right cheek   Depression    takes Celexa daily   Depression    Phreesia 07/01/2020   GERD (gastroesophageal reflux disease)    occ   History of colon polyps    History of shingles    5-6 yrs   Hyperlipidemia    Hypothyroid    Insomnia    takes Restoril nightly as needed   Interstitial cystitis    Osteoarthritis    Sjogren's syndrome ([place])    Spinal headache    after 1st C/S, o2sat low after shoulder surgery after went to room(51%),hard to urinate)   Thyroid disease    Phreesia 09/21/2019    Current Outpatient Medications  Medication Sig Dispense Refill   albuterol (PROAIR HFA) 108 (90 Base) MCG/ACT inhaler INHALE 2 PUFFS INTO THE LUNGS EVERY 6 (SIX) HOURS AS NEEDED FOR SHORTNESS OF BREATH. 18  g 0   azelastine (ASTELIN) 0.1 % nasal spray Place 2 sprays into both nostrils 2 (two) times daily. Use in each nostril as directed 30 mL 12   cholecalciferol (VITAMIN D) 1000 UNITS tablet Take 1,000 Units by mouth daily.     citalopram (CELEXA) 40 MG tablet Take 1 tablet (40 mg total) by mouth daily. 90 tablet 4   docusate sodium (COLACE) 100 MG capsule Take 400 mg by mouth at bedtime.      fluticasone (FLONASE) 50 MCG/ACT nasal spray USE  1-2 SPRAYS INTO EACH NOSTRIL ONCE A DAY AS NEEDED 48 mL 1   levothyroxine (SYNTHROID) 75 MCG tablet Take 1 tablet (75 mcg total) by mouth daily. 90 tablet 3   loratadine (CLARITIN) 10 MG tablet Take 10 mg by mouth daily as needed for allergies.      temazepam (RESTORIL) 15 MG capsule TAKE 1 CAPSULE BY MOUTH EVERY DAY AT BEDTIME AS NEEDED 30 capsule 0   vitamin B-12 (CYANOCOBALAMIN) 1000 MCG tablet Take 1,000 mcg by mouth every other day.      No current facility-administered medications for this visit.    Allergies  Allergen Reactions   Diflucan [Fluconazole] Hives    unknown   Thimerosal Hives   Mercury Detox [Nutritional Supplements] Hives   Influenza Vaccine Recombinant Rash    Mercury component and Thierosal in flu vaccine   Levaquin [Levofloxacin] Rash   Sulfa Antibiotics Rash    Social History   Socioeconomic History   Marital status: Married    Spouse name: Not on file   Number of children: 2   Years of education: Not on file   Highest education level: Not on file  Occupational History   Not on file  Tobacco Use   Smoking status: Never   Smokeless tobacco: Never  Vaping Use   Vaping Use: Never used  Substance and Sexual Activity   Alcohol use: Yes    Alcohol/week: 2.0 standard drinks    Types: 2 Glasses of wine per week   Drug use: No   Sexual activity: Not Currently    Partners: Male    Birth control/protection: Post-menopausal  Other Topics Concern   Not on file  Social History Narrative   Work or School: nanny for multiple families      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   HENT:  Positive for congestion, ear pain and sinus pain. Negative for ear discharge, hearing loss, nosebleeds, sore throat and tinnitus.   Eyes: Negative.   Respiratory:  Positive for cough. Negative for hemoptysis, sputum production, shortness of breath, wheezing and stridor.   Cardiovascular: Negative.   Gastrointestinal: Negative.   Genitourinary: Negative.   Musculoskeletal: Negative.   Skin: Negative.   Neurological: Negative.   Endo/Heme/Allergies: Negative.   Psychiatric/Behavioral: Negative.    All other systems reviewed and are negative.  Objective   Vitals as reported by the patient: There were no vitals filed for this visit.  Lerin was seen today for sinusitis.  Diagnoses and all orders for this  visit:  Sinus congestion -     Ambulatory referral to ENT -     amoxicillin-clavulanate (AUGMENTIN) 875-125 MG tablet; Take 1 tablet by mouth 2 (two) times daily. -     azelastine (ASTELIN) 0.1 % nasal spray; Place 1 spray into both nostrils 2 (two) times daily. Use in each nostril as directed  Chronic frontal sinusitis -     Ambulatory referral to ENT  PLAN Augmentin po bid for 10 days Azelastine twice daily as needed Continue supportive care with mucinex and saline rinses Refer to ENT for chronic sinusitis Return if worsening or failing to improve Patient encouraged to call clinic with any questions, comments, or concerns.  I discussed the assessment and treatment plan with the patient. The patient was provided an opportunity to ask questions and all were answered. The patient agreed with the plan and demonstrated an understanding of the instructions.   The patient was advised to call back or seek an in-person evaluation if the symptoms worsen or if the condition fails to improve as anticipated.  I provided 15 minutes of non-face-to-face time during this encounter.  Maximiano Coss, NP  Primary Care at [facility]

## 2020-10-13 ENCOUNTER — Telehealth: Payer: Self-pay

## 2020-10-13 NOTE — Telephone Encounter (Signed)
Pt is wanting to see if Dr Carlota Raspberry can check Colonoscopy last she had polyps and precancer she was told to do colonoscopy in 5 years and sytem is showing 10?  Pt call back [phone]

## 2020-10-22 ENCOUNTER — Telehealth: Payer: Self-pay

## 2020-10-22 NOTE — Telephone Encounter (Signed)
Dermatology called- the patient is not yet scheduled their office will reach out to the patient to get this scheduled within a week.

## 2020-11-01 DIAGNOSIS — J329 Chronic sinusitis, unspecified: Secondary | ICD-10-CM | POA: Diagnosis not present

## 2020-11-01 DIAGNOSIS — R519 Headache, unspecified: Secondary | ICD-10-CM | POA: Diagnosis not present

## 2020-11-08 ENCOUNTER — Telehealth: Payer: Self-pay | Admitting: Family Medicine

## 2020-11-08 NOTE — Telephone Encounter (Signed)
Patient needs a referral for a dermatologist - she has one now but is not satisfied with them - she is currently with Dr. Merilynn Finland .  Please advise.

## 2020-11-30 ENCOUNTER — Other Ambulatory Visit: Payer: Self-pay | Admitting: Family Medicine

## 2020-11-30 DIAGNOSIS — G4700 Insomnia, unspecified: Secondary | ICD-10-CM

## 2020-12-01 NOTE — Telephone Encounter (Signed)
Medication discussed at her March visit.  Controlled substance database reviewed.  Last filled March 25, no concerning findings.  Refill ordered

## 2020-12-01 NOTE — Telephone Encounter (Signed)
LFD 07/02/20 #30 with no refills LOV 09/30/20 NOV 01/06/21 for cpe

## 2020-12-03 DIAGNOSIS — R519 Headache, unspecified: Secondary | ICD-10-CM | POA: Diagnosis not present

## 2020-12-03 DIAGNOSIS — J329 Chronic sinusitis, unspecified: Secondary | ICD-10-CM | POA: Diagnosis not present

## 2020-12-23 ENCOUNTER — Other Ambulatory Visit: Payer: Self-pay

## 2020-12-23 ENCOUNTER — Ambulatory Visit (INDEPENDENT_AMBULATORY_CARE_PROVIDER_SITE_OTHER): Payer: Medicare HMO | Admitting: Registered Nurse

## 2020-12-23 ENCOUNTER — Encounter: Payer: Self-pay | Admitting: Registered Nurse

## 2020-12-23 VITALS — BP 128/78 | HR 68 | Temp 98.2°F | Resp 18 | Ht 63.5 in | Wt 154.6 lb

## 2020-12-23 DIAGNOSIS — E039 Hypothyroidism, unspecified: Secondary | ICD-10-CM | POA: Diagnosis not present

## 2020-12-23 DIAGNOSIS — Z111 Encounter for screening for respiratory tuberculosis: Secondary | ICD-10-CM

## 2020-12-23 DIAGNOSIS — E785 Hyperlipidemia, unspecified: Secondary | ICD-10-CM

## 2020-12-23 DIAGNOSIS — R7303 Prediabetes: Secondary | ICD-10-CM

## 2020-12-23 LAB — CBC WITH DIFFERENTIAL/PLATELET
Basophils Absolute: 0 10*3/uL (ref 0.0–0.1)
Basophils Relative: 0.8 % (ref 0.0–3.0)
Eosinophils Absolute: 0.1 10*3/uL (ref 0.0–0.7)
Eosinophils Relative: 1.5 % (ref 0.0–5.0)
HCT: 40 % (ref 36.0–46.0)
Hemoglobin: 13.4 g/dL (ref 12.0–15.0)
Lymphocytes Relative: 31.7 % (ref 12.0–46.0)
Lymphs Abs: 1.6 10*3/uL (ref 0.7–4.0)
MCHC: 33.4 g/dL (ref 30.0–36.0)
MCV: 90.1 fl (ref 78.0–100.0)
Monocytes Absolute: 0.4 10*3/uL (ref 0.1–1.0)
Monocytes Relative: 7.5 % (ref 3.0–12.0)
Neutro Abs: 2.9 10*3/uL (ref 1.4–7.7)
Neutrophils Relative %: 58.5 % (ref 43.0–77.0)
Platelets: 171 10*3/uL (ref 150.0–400.0)
RBC: 4.45 Mil/uL (ref 3.87–5.11)
RDW: 12.9 % (ref 11.5–15.5)
WBC: 5 10*3/uL (ref 4.0–10.5)

## 2020-12-23 LAB — COMPREHENSIVE METABOLIC PANEL
ALT: 14 U/L (ref 0–35)
AST: 17 U/L (ref 0–37)
Albumin: 4.3 g/dL (ref 3.5–5.2)
Alkaline Phosphatase: 49 U/L (ref 39–117)
BUN: 20 mg/dL (ref 6–23)
CO2: 26 mEq/L (ref 19–32)
Calcium: 10.2 mg/dL (ref 8.4–10.5)
Chloride: 103 mEq/L (ref 96–112)
Creatinine, Ser: 0.98 mg/dL (ref 0.40–1.20)
GFR: 59.45 mL/min — ABNORMAL LOW (ref >60.00)
Glucose, Bld: 84 mg/dL (ref 70–99)
Potassium: 4 mEq/L (ref 3.5–5.1)
Sodium: 138 mEq/L (ref 135–145)
Total Bilirubin: 0.5 mg/dL (ref 0.2–1.2)
Total Protein: 6.7 g/dL (ref 6.0–8.3)

## 2020-12-23 LAB — LIPID PANEL
Cholesterol: 224 mg/dL — ABNORMAL HIGH (ref 0–200)
HDL: 64 mg/dL (ref >39.00)
LDL Cholesterol: 140 mg/dL — ABNORMAL HIGH (ref 0–99)
NonHDL: 160.49
Total CHOL/HDL Ratio: 4
Triglycerides: 102 mg/dL (ref 0.0–149.0)
VLDL: 20.4 mg/dL (ref 0.0–40.0)

## 2020-12-23 LAB — HEMOGLOBIN A1C: Hgb A1c MFr Bld: 6 % (ref 4.6–6.5)

## 2020-12-23 LAB — TSH: TSH: 1.79 u[IU]/mL (ref 0.35–5.50)

## 2020-12-23 NOTE — Patient Instructions (Addendum)
Ms. Jubran -  Doristine Devoid to see you  Stay limber - stretch daily!  Labs today will be back this evening  I'll call with any urgent concerns.  See you in 6 mo!   Thanks,  Rich     If you have lab work done today you will be contacted with your lab results within the next 2 weeks.  If you have not heard from [place] then please contact us. The fastest way to get your results is to register for My Chart.   IF you received an x-ray today, you will receive an invoice from [facility]. Please contact [facility] at [phone] with questions or concerns regarding your invoice.   IF you received labwork today, you will receive an invoice from [facility]. Please contact [facility] at [phone] with questions or concerns regarding your invoice.   Our billing staff will not be able to assist you with questions regarding bills from these companies.  You will be contacted with the lab results as soon as they are available. The fastest way to get your results is to activate your My Chart account. Instructions are located on the last page of this paperwork. If you have not heard from [place] regarding the results in 2 weeks, please contact this office.

## 2020-12-23 NOTE — Progress Notes (Addendum)
Established Patient Office Visit  Subjective:  Patient ID: Christine Valdez, female    DOB: 02-Mar-1951  Age: 70 y.o. MRN: [id number]  CC:  Chief Complaint  Patient presents with   Medication Refill    Patient states she is here for a medication refill and a form that needs to be filled out .    HPI Christine Valdez presents for med refill and wellness check  Needs form signed off for [employer] Requires TB testing - discussed limitations, will pursue quantiferon gold testing.    Hypertension: Patient Currently taking: no medications. Good effect. No AEs. Denies CV symptoms including: chest pain, shob, doe, headache, visual changes, fatigue, claudication, and dependent edema.   Previous readings and labs: BP Readings from Last 3 Encounters:  12/23/20 128/78  07/02/20 136/86  12/09/19 112/70   Lab Results  Component Value Date   CREATININE 0.90 06/25/2020     Prediabetes Diet and exercise Doing well, lost 9 lbs in past 6 mo N oconcerns, wants to recheck sugars.   MDD Stable No concerns.  Past Medical History:  Diagnosis Date   Allergy    uses Flonase daily as needed and takes Loratadine daily    Anemia    only after birth of 2nd child   Arthritis    Asthma    uses Albuterol daily as needed;exercise induced   Cancer ([place]) 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, right cheek   Depression    takes Celexa daily   Depression    Phreesia 07/01/2020   GERD (gastroesophageal reflux disease)    occ   History of colon polyps    History of shingles    5-6 yrs   Hyperlipidemia    Hypothyroid    Insomnia    takes Restoril nightly as needed   Interstitial cystitis    Osteoarthritis    Sjogren's syndrome ([place])    Spinal headache    after 1st C/S, o2sat low after shoulder surgery after went to room(51%),hard to urinate)   Thyroid disease    Phreesia 09/21/2019    Past Surgical History:  Procedure Laterality Date   CARPAL TUNNEL RELEASE Right 05/2007    CESAREAN SECTION  1981/1984   x 2   COLONOSCOPY     JOINT REPLACEMENT N/A    Phreesia 09/21/2019   TONSILLECTOMY AND ADENOIDECTOMY  age 25   TOTAL SHOULDER ARTHROPLASTY Right 07/04/2013   DR NORRIS   TOTAL SHOULDER ARTHROPLASTY Right 07/04/2013   Procedure: RIGHT TOTAL SHOULDER ARTHROPLASTY;  Surgeon: Augustin Schooling, MD;  Location: [facility];  Service: Orthopedics;  Laterality: Right;   TOTAL SHOULDER ARTHROPLASTY Left 03/12/2015   Procedure: LEFT SHOULDER TOTAL SHOULDER ARTHROPLASTY;  Surgeon: Netta Cedars, MD;  Location: [facility];  Service: Orthopedics;  Laterality: Left;    Family History  Problem Relation Age of Onset   Breast cancer Sister 54       stage 8, chemo and radiation   Esophageal cancer Sister    Diabetes Brother    Epilepsy Daughter    Hypertension Mother    Osteoporosis Mother    Diabetes Father    Hypertension Father    Lung cancer Paternal Aunt     Social History   Socioeconomic History   Marital status: Married    Spouse name: Not on file   Number of children: 2   Years of education: Not on file   Highest education level: Not on file  Occupational History   Not on  file  Tobacco Use   Smoking status: Never   Smokeless tobacco: Never  Vaping Use   Vaping Use: Never used  Substance and Sexual Activity   Alcohol use: Yes    Alcohol/week: 2.0 standard drinks    Types: 2 Glasses of wine per week   Drug use: No   Sexual activity: Not Currently    Partners: Male    Birth control/protection: Post-menopausal  Other Topics Concern   Not on file  Social History Narrative   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 18 g 0   amoxicillin-clavulanate (AUGMENTIN) 875-125 MG tablet Take 1 tablet by mouth 2 (two) times daily. 20 tablet 0   azelastine (ASTELIN) 0.1 % nasal spray Place 2 sprays into both nostrils 2 (two) times daily. Use in each nostril as directed 30 mL 12   azelastine (ASTELIN) 0.1 % nasal spray Place 1 spray into both nostrils 2 (two) times daily. Use in each nostril as directed 30 mL 12   cholecalciferol (VITAMIN D) 1000 UNITS tablet Take 1,000 Units by mouth daily.     citalopram (CELEXA) 40 MG tablet Take 1 tablet (40 mg total) by mouth daily. 90 tablet 4   docusate sodium (COLACE) 100 MG capsule Take 400 mg by mouth at bedtime.      fluticasone (FLONASE) 50 MCG/ACT nasal spray USE 1-2 SPRAYS INTO EACH NOSTRIL ONCE A DAY AS NEEDED 48 mL 1   levothyroxine (SYNTHROID) 75 MCG tablet Take 1 tablet (75 mcg total) by mouth daily. 90 tablet 3   loratadine (CLARITIN) 10 MG tablet Take 10 mg by mouth daily as needed for allergies.      temazepam (RESTORIL) 15 MG capsule TAKE 1 CAPSULE BY MOUTH EVERY DAY AT BEDTIME AS NEEDED 30 capsule 0   vitamin B-12 (CYANOCOBALAMIN) 1000 MCG tablet Take 1,000 mcg by mouth every other day.      No facility-administered medications prior to visit.    Allergies  Allergen Reactions   Diflucan [Fluconazole] Hives    unknown   Thimerosal Hives   Mercury Detox [Nutritional Supplements] Hives   Influenza Vaccine Recombinant Rash    Mercury component and Thierosal in flu vaccine   Levaquin [Levofloxacin] Rash   Sulfa Antibiotics Rash    ROS Review of Systems  Constitutional: Negative.   HENT: Negative.    Eyes: Negative.   Respiratory: Negative.    Cardiovascular: Negative.   Gastrointestinal:  Negative.   Genitourinary: Negative.   Musculoskeletal: Negative.   Skin: Negative.   Neurological: Negative.   Psychiatric/Behavioral: Negative.    All other systems reviewed and are negative.    Objective:    Physical Exam Vitals and nursing note reviewed.  Constitutional:      General: She is not in acute distress.    Appearance:  Normal appearance. She is normal weight. She is not ill-appearing, toxic-appearing or diaphoretic.  Cardiovascular:     Rate and Rhythm: Normal rate and regular rhythm.     Heart sounds: Normal heart sounds. No murmur heard.   No friction rub. No gallop.  Pulmonary:     Effort: Pulmonary effort is normal. No respiratory distress.     Breath sounds: Normal breath sounds. No stridor. No wheezing, rhonchi or rales.  Chest:     Chest wall: No tenderness.  Skin:    General: Skin is warm and dry.  Neurological:     General: No focal deficit present.     Mental Status: She is alert and oriented to person, place, and time. Mental status is at baseline.  Psychiatric:        Mood and Affect: Mood normal.        Behavior: Behavior normal.        Thought Content: Thought content normal.        Judgment: Judgment normal.    BP 128/78 (BP Location: Left Arm, Patient Position: Sitting, Cuff Size: Normal)   Pulse 68   Temp 98.2 F (36.8 C) (Temporal)   Resp 18   Ht 5' 3.5" (1.613 m)   Wt 154 lb 9.6 oz (70.1 kg)   LMP 04/11/2003 (Approximate)   BMI 26.96 kg/m  Wt Readings from Last 3 Encounters:  12/23/20 154 lb 9.6 oz (70.1 kg)  07/02/20 163 lb (73.9 kg)  06/02/20 159 lb (72.1 kg)     There are no preventive care reminders to display for this patient.  There are no preventive care reminders to display for this patient.  Lab Results  Component Value Date   TSH 1.470 09/24/2019   Lab Results  Component Value Date   WBC 3.7 (A) 04/30/2017   HGB 12.8 04/30/2017   HCT 37.8 04/30/2017   MCV 87.6 04/30/2017   PLT 179.0 05/22/2016   Lab  Results  Component Value Date   NA 136 06/25/2020   K 4.5 06/25/2020   CO2 22 06/25/2020   GLUCOSE 110 (H) 06/25/2020   BUN 20 06/25/2020   CREATININE 0.90 06/25/2020   BILITOT <0.2 06/25/2020   ALKPHOS 53 06/25/2020   AST 19 06/25/2020   ALT 12 06/25/2020   PROT 6.3 06/25/2020   ALBUMIN 4.2 06/25/2020   CALCIUM 9.4 06/25/2020   ANIONGAP 9 03/13/2015   EGFR 70 06/25/2020   GFR 72.65 05/22/2016   Lab Results  Component Value Date   CHOL 204 (H) 06/25/2020   Lab Results  Component Value Date   HDL 58 06/25/2020   Lab Results  Component Value Date   LDLCALC 127 (H) 06/25/2020   Lab Results  Component Value Date   TRIG 106 06/25/2020   Lab Results  Component Value Date   CHOLHDL 3.5 06/25/2020   Lab Results  Component Value Date   HGBA1C 6.4 (H) 06/25/2020      Assessment & Plan:   Problem List Items Addressed This Visit       Endocrine   Hypothyroidism   Relevant Orders   CBC with Differential/Platelet   Comprehensive metabolic panel   TSH   Other Visit Diagnoses     Screening examination for pulmonary tuberculosis    -  Primary   Relevant Orders   QuantiFERON-TB Gold Plus   Dyslipidemia       Relevant Orders   CBC with Differential/Platelet   Comprehensive metabolic panel   Lipid panel  Prediabetes       Relevant Orders   CBC with Differential/Platelet   Comprehensive metabolic panel   Hemoglobin A1c       No orders of the defined types were placed in this encounter.   Follow-up: Return in about 6 months (around 06/22/2021) for AWV w Carlota Raspberry.   PLAN Labs collected. Will follow up with the patient as warranted. Refill meds x 6 mo Form filled out Patient encouraged to call clinic with any questions, comments, or concerns.  Maximiano Coss, NP

## 2020-12-24 ENCOUNTER — Other Ambulatory Visit: Payer: Self-pay

## 2020-12-24 ENCOUNTER — Other Ambulatory Visit: Payer: Self-pay | Admitting: Registered Nurse

## 2020-12-24 ENCOUNTER — Other Ambulatory Visit: Payer: Self-pay | Admitting: Family Medicine

## 2020-12-24 DIAGNOSIS — G4700 Insomnia, unspecified: Secondary | ICD-10-CM

## 2020-12-24 DIAGNOSIS — Z1389 Encounter for screening for other disorder: Secondary | ICD-10-CM

## 2020-12-24 MED ORDER — TEMAZEPAM 15 MG PO CAPS: ORAL_CAPSULE | ORAL | 0 refills | Status: DC

## 2020-12-24 NOTE — Telephone Encounter (Signed)
Patient has a question about her blood work - she would like for Richard to call her

## 2020-12-24 NOTE — Telephone Encounter (Signed)
Returned patients call about her labs. Discussed labs with patient and patient understood. Patient also needs a refill on her Temazepam '15mg'$  30 tabs 0 refills. Patient was last seen by Maximiano Coss on 12/23/20

## 2020-12-24 NOTE — Telephone Encounter (Signed)
Patient also wants a referral to dermatology. Patient has seen Dr Renda Rolls in the past but would rather not go back to this particular provider but does prefer a female dermatologist. I will place referral with your approval.

## 2020-12-25 NOTE — Telephone Encounter (Signed)
Referral ordered

## 2020-12-26 LAB — QUANTIFERON-TB GOLD PLUS
Mitogen-NIL: >10 IU/mL
NIL: 0.06 IU/mL
QuantiFERON-TB Gold Plus: NEGATIVE
TB1-NIL: 0 IU/mL
TB2-NIL: 0.01 IU/mL

## 2020-12-30 ENCOUNTER — Telehealth: Payer: Self-pay

## 2020-12-30 NOTE — Telephone Encounter (Signed)
Form for Medicare Prescription Drug Coverage Determination was turned in to be completed placed in Dr Carlota Raspberry bin

## 2020-12-30 NOTE — Telephone Encounter (Signed)
Forms placed in bin to be filled 

## 2021-01-05 ENCOUNTER — Telehealth: Payer: Self-pay

## 2021-01-05 NOTE — Telephone Encounter (Signed)
Attempted to call patient and discuss that she didn't need the Prior Authorization for the Temazepam according to CoverMyMeds .

## 2021-01-06 ENCOUNTER — Encounter: Payer: Medicare HMO | Admitting: Family Medicine

## 2021-01-25 ENCOUNTER — Other Ambulatory Visit: Payer: Self-pay | Admitting: Obstetrics & Gynecology

## 2021-01-25 DIAGNOSIS — Z1231 Encounter for screening mammogram for malignant neoplasm of breast: Secondary | ICD-10-CM

## 2021-02-01 DIAGNOSIS — H02834 Dermatochalasis of left upper eyelid: Secondary | ICD-10-CM | POA: Diagnosis not present

## 2021-02-01 DIAGNOSIS — H40013 Open angle with borderline findings, low risk, bilateral: Secondary | ICD-10-CM | POA: Diagnosis not present

## 2021-02-01 DIAGNOSIS — H04123 Dry eye syndrome of bilateral lacrimal glands: Secondary | ICD-10-CM | POA: Diagnosis not present

## 2021-02-01 DIAGNOSIS — H1045 Other chronic allergic conjunctivitis: Secondary | ICD-10-CM | POA: Diagnosis not present

## 2021-02-01 DIAGNOSIS — H02831 Dermatochalasis of right upper eyelid: Secondary | ICD-10-CM | POA: Diagnosis not present

## 2021-02-01 DIAGNOSIS — H2513 Age-related nuclear cataract, bilateral: Secondary | ICD-10-CM | POA: Diagnosis not present

## 2021-02-01 DIAGNOSIS — H11123 Conjunctival concretions, bilateral: Secondary | ICD-10-CM | POA: Diagnosis not present

## 2021-02-07 DIAGNOSIS — L821 Other seborrheic keratosis: Secondary | ICD-10-CM | POA: Diagnosis not present

## 2021-02-07 DIAGNOSIS — Z1283 Encounter for screening for malignant neoplasm of skin: Secondary | ICD-10-CM | POA: Diagnosis not present

## 2021-02-10 ENCOUNTER — Telehealth (INDEPENDENT_AMBULATORY_CARE_PROVIDER_SITE_OTHER): Payer: Medicare HMO | Admitting: Family Medicine

## 2021-02-10 DIAGNOSIS — R0981 Nasal congestion: Secondary | ICD-10-CM

## 2021-02-10 DIAGNOSIS — R059 Cough, unspecified: Secondary | ICD-10-CM

## 2021-02-10 DIAGNOSIS — R519 Headache, unspecified: Secondary | ICD-10-CM

## 2021-02-10 MED ORDER — DOXYCYCLINE HYCLATE 100 MG PO TABS: 100.0000 mg | ORAL_TABLET | Freq: Two times a day (BID) | ORAL | 0 refills | Status: DC

## 2021-02-10 MED ORDER — BENZONATATE 100 MG PO CAPS: ORAL_CAPSULE | ORAL | 0 refills | Status: DC

## 2021-02-10 NOTE — Progress Notes (Signed)
Virtual Visit via Video Note  I connected with Patti  on 02/10/21 at  1:00 PM EDT by a video enabled telemedicine application and verified that I am speaking with the correct person using two identifiers.  Location patient: home, [place] Location provider:work or home office Persons participating in the virtual visit: patient, provider  I discussed the limitations of evaluation and management by telemedicine and the availability of in person appointments. The patient expressed understanding and agreed to proceed.   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 -Symptoms include: cough, congestion, fever for a few days - now ongoing thick sinus drainage now worsening with sinus discomfort, cough, thick drainage that is discolored -Denies: fevers, CP, SOB, malaise, hemoptysis -Pertinent past medical history: see below -Pertinent medication allergies: Allergies  Allergen Reactions   Diflucan [Fluconazole] Hives    unknown   Thimerosal Hives   Mercury Detox [Nutritional Supplements] Hives   Influenza Vaccine Recombinant Rash    Mercury component and Thierosal in flu vaccine   Levaquin [Levofloxacin] Rash   Sulfa Antibiotics Rash  -COVID-19 vaccine status: Immunization History  Administered Date(s) Administered   Fluad Quad(high Dose 65+) 01/03/2019   PFIZER(Purple Top)SARS-COV-2 Vaccination 04/29/2019, 05/18/2019, 12/16/2019   Pneumococcal Conjugate-13 02/02/2018   Pneumococcal Polysaccharide-23 04/21/2014   Td 11/24/2019   Tdap 06/24/2013   Zoster Recombinat (Shingrix) 07/23/2017, 09/21/2017    ROS: See pertinent positives and negatives per HPI.  Past Medical History:  Diagnosis Date   Allergy    uses Flonase daily as needed and takes Loratadine daily    Anemia    only after birth of 2nd child   Arthritis    Asthma    uses Albuterol daily as needed;exercise induced   Cancer ([place])  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, right cheek   Depression    takes Celexa daily   Depression    Phreesia 07/01/2020   GERD (gastroesophageal reflux disease)    occ   History of colon polyps    History of shingles    5-6 yrs   Hyperlipidemia    Hypothyroid    Insomnia    takes Restoril nightly as needed   Interstitial cystitis    Osteoarthritis    Sjogren's syndrome ([place])    Spinal headache    after 1st C/S, o2sat low after shoulder surgery after went to room(51%),hard to urinate)   Thyroid disease    Phreesia 09/21/2019    Past Surgical History:  Procedure Laterality Date   CARPAL TUNNEL RELEASE Right 05/2007   CESAREAN SECTION  1981/1984   x 2   COLONOSCOPY     JOINT REPLACEMENT N/A    Phreesia 09/21/2019   TONSILLECTOMY AND ADENOIDECTOMY  age 14   TOTAL SHOULDER ARTHROPLASTY Right 07/04/2013   DR NORRIS   TOTAL SHOULDER ARTHROPLASTY Right 07/04/2013   Procedure: RIGHT TOTAL SHOULDER ARTHROPLASTY;  Surgeon: Augustin Schooling, MD;  Location: [facility];  Service: Orthopedics;  Laterality: Right;   TOTAL SHOULDER ARTHROPLASTY Left 03/12/2015   Procedure: LEFT SHOULDER TOTAL SHOULDER ARTHROPLASTY;  Surgeon: Netta Cedars, MD;  Location: [facility];  Service: Orthopedics;  Laterality: Left;     Current Outpatient Medications:    albuterol (PROAIR HFA) 108 (90 Base) MCG/ACT inhaler, INHALE 2 PUFFS INTO THE LUNGS EVERY 6 (SIX) HOURS AS NEEDED FOR SHORTNESS OF BREATH., Disp: 18 g, Rfl: 0   amoxicillin-clavulanate (AUGMENTIN) 875-125 MG tablet, Take  1 tablet by mouth 2 (two) times daily., Disp: 20 tablet, Rfl: 0   azelastine (ASTELIN) 0.1 % nasal spray, Place 2 sprays into both nostrils 2 (two) times daily. Use in each nostril as directed, Disp: 30 mL, Rfl: 12   azelastine (ASTELIN) 0.1 % nasal spray, Place 1 spray into both nostrils 2 (two) times daily. Use in each nostril as directed, Disp: 30 mL, Rfl: 12   cholecalciferol (VITAMIN D) 1000 UNITS tablet, Take 1,000 Units by mouth daily., Disp: , Rfl:     citalopram (CELEXA) 40 MG tablet, Take 1 tablet (40 mg total) by mouth daily., Disp: 90 tablet, Rfl: 4   docusate sodium (COLACE) 100 MG capsule, Take 400 mg by mouth at bedtime. , Disp: , Rfl:    fluticasone (FLONASE) 50 MCG/ACT nasal spray, USE 1-2 SPRAYS INTO EACH NOSTRIL ONCE A DAY AS NEEDED, Disp: 48 mL, Rfl: 1   levothyroxine (SYNTHROID) 75 MCG tablet, Take 1 tablet (75 mcg total) by mouth daily., Disp: 90 tablet, Rfl: 3   loratadine (CLARITIN) 10 MG tablet, Take 10 mg by mouth daily as needed for allergies. , Disp: , Rfl:    temazepam (RESTORIL) 15 MG capsule, TAKE 1 CAPSULE BY MOUTH EVERY DAY AT BEDTIME AS NEEDED, Disp: 30 capsule, Rfl: 0   vitamin B-12 (CYANOCOBALAMIN) 1000 MCG tablet, Take 1,000 mcg by mouth every other day. , Disp: , Rfl:   EXAM:  VITALS per patient if applicable:  GENERAL: alert, oriented, appears well and in no acute distress  HEENT: atraumatic, conjunttiva clear, no obvious abnormalities on inspection of external nose and ears  NECK: normal movements of the head and neck  LUNGS: on inspection no signs of respiratory distress, breathing rate appears normal, no obvious gross SOB, gasping or wheezing  CV: no obvious cyanosis  MS: moves all visible extremities without noticeable abnormality  PSYCH/NEURO: pleasant and cooperative, no obvious depression or anxiety, speech and thought processing grossly intact  ASSESSMENT AND PLAN:  Discussed the following assessment and plan:  Nasal sinus congestion  Facial discomfort  Cough, unspecified type  -we discussed possible serious and likely etiologies, options for evaluation and workup, limitations of telemedicine visit vs in person visit, treatment, treatment risks and precautions. Pt is agreeable to treatment via telemedicine at this moment. She opted for empiric tx with doxy 100mg  bid x 10 days for possible sinusitis and tessalon for cough. Advised to seek prompt in person care if worsening, new symptoms  arise, or if is not improving with treatment. Discussed options for inperson care if PCP office not available. Did let this patient know that I only do telemedicine on Tuesdays and Thursdays for [place]. Advised to schedule follow up visit with PCP or [facility] if any further questions or concerns to avoid delays in care.   I discussed the assessment and treatment plan with the patient. The patient was provided an opportunity to ask questions and all were answered. The patient agreed with the plan and demonstrated an understanding of the instructions.     Lucretia Kern, DO

## 2021-02-10 NOTE — Patient Instructions (Signed)
-  I sent the medication(s) we discussed to your pharmacy: Meds ordered this encounter  Medications   doxycycline (VIBRA-TABS) 100 MG tablet    Sig: Take 1 tablet (100 mg total) by mouth 2 (two) times daily.    Dispense:  20 tablet    Refill:  0   benzonatate (TESSALON PERLES) 100 MG capsule    Sig: 1-2 capsules up to twice daily as needed for cough    Dispense:  20 capsule    Refill:  0     I hope you are feeling better soon!  Seek in person care promptly if your symptoms worsen, new concerns arise or you are not improving with treatment.  It was nice to meet you today. I help [facility] out with telemedicine visits on Tuesdays and Thursdays and am available for visits on those days. If you have any concerns or questions following this visit please schedule a follow up visit with your Primary Care doctor or seek care at a local urgent care clinic to avoid delays in care.

## 2021-02-23 ENCOUNTER — Ambulatory Visit
Admission: RE | Admit: 2021-02-23 | Discharge: 2021-02-23 | Disposition: A | Discharge status: Home / Self Care | Payer: Medicare HMO | Source: Ambulatory Visit | Attending: Obstetrics & Gynecology | Admitting: Obstetrics & Gynecology

## 2021-02-23 DIAGNOSIS — Z1231 Encounter for screening mammogram for malignant neoplasm of breast: Secondary | ICD-10-CM

## 2021-02-25 ENCOUNTER — Other Ambulatory Visit: Payer: Self-pay | Admitting: Obstetrics & Gynecology

## 2021-02-25 DIAGNOSIS — R928 Other abnormal and inconclusive findings on diagnostic imaging of breast: Secondary | ICD-10-CM

## 2021-03-09 ENCOUNTER — Other Ambulatory Visit: Payer: Self-pay | Admitting: Obstetrics & Gynecology

## 2021-03-09 NOTE — Telephone Encounter (Signed)
LMOVM for pt to return call. Needs appt

## 2021-03-15 ENCOUNTER — Encounter (HOSPITAL_BASED_OUTPATIENT_CLINIC_OR_DEPARTMENT_OTHER): Payer: Self-pay | Admitting: Obstetrics & Gynecology

## 2021-03-16 ENCOUNTER — Telehealth (HOSPITAL_BASED_OUTPATIENT_CLINIC_OR_DEPARTMENT_OTHER): Payer: Self-pay | Admitting: *Deleted

## 2021-03-16 NOTE — Telephone Encounter (Signed)
Dr Sabra Heck called the patient today 03/16/21 and left a message to discuss the MyChart message she sent. KW CMA

## 2021-03-28 ENCOUNTER — Ambulatory Visit
Admission: RE | Admit: 2021-03-28 | Discharge: 2021-03-28 | Disposition: A | Discharge status: Home / Self Care | Payer: Medicare HMO | Source: Ambulatory Visit | Attending: Obstetrics & Gynecology | Admitting: Obstetrics & Gynecology

## 2021-03-28 DIAGNOSIS — R928 Other abnormal and inconclusive findings on diagnostic imaging of breast: Secondary | ICD-10-CM

## 2021-03-28 DIAGNOSIS — R922 Inconclusive mammogram: Secondary | ICD-10-CM | POA: Diagnosis not present

## 2021-03-29 ENCOUNTER — Other Ambulatory Visit (HOSPITAL_BASED_OUTPATIENT_CLINIC_OR_DEPARTMENT_OTHER): Payer: Self-pay | Admitting: Obstetrics & Gynecology

## 2021-03-29 MED ORDER — CITALOPRAM HYDROBROMIDE 40 MG PO TABS: 40.0000 mg | ORAL_TABLET | Freq: Every day | ORAL | 3 refills | Status: DC

## 2021-04-18 ENCOUNTER — Telehealth: Payer: Self-pay

## 2021-04-18 ENCOUNTER — Telehealth (HOSPITAL_BASED_OUTPATIENT_CLINIC_OR_DEPARTMENT_OTHER): Payer: Medicare HMO | Admitting: Obstetrics & Gynecology

## 2021-04-19 NOTE — Telephone Encounter (Signed)
Error

## 2021-04-20 ENCOUNTER — Encounter: Payer: Self-pay | Admitting: Family Medicine

## 2021-04-20 ENCOUNTER — Ambulatory Visit (INDEPENDENT_AMBULATORY_CARE_PROVIDER_SITE_OTHER): Payer: Medicare HMO | Admitting: Family Medicine

## 2021-04-20 VITALS — HR 74 | Temp 98.1°F | Resp 15 | Ht 63.5 in | Wt 154.6 lb

## 2021-04-20 DIAGNOSIS — M79672 Pain in left foot: Secondary | ICD-10-CM | POA: Diagnosis not present

## 2021-04-20 DIAGNOSIS — M79675 Pain in left toe(s): Secondary | ICD-10-CM | POA: Diagnosis not present

## 2021-04-20 DIAGNOSIS — Z8249 Family history of ischemic heart disease and other diseases of the circulatory system: Secondary | ICD-10-CM | POA: Diagnosis not present

## 2021-04-20 DIAGNOSIS — R238 Other skin changes: Secondary | ICD-10-CM | POA: Diagnosis not present

## 2021-04-20 NOTE — Progress Notes (Signed)
Subjective:  Patient ID: Christine Valdez, female    DOB: [birth year]  Age: 71 y.o. MRN: [id number]  CC:  Chief Complaint  Patient presents with   Toe Pain    Pt reports started as pain in 2 toes on Lt foot , started about 1-2 months ago, has dissipated to only 1 toe over the last couple weeks. Notes sometimes painful after walking for exercise, notes got new shoes and is still having pain, notes no injury or jamming, notes some possible lower circulation as well in feet     HPI Christine Valdez presents for   L foot pain: Few months ago - 3rd to 4th toes. Increased exercise at [facility]. Walking more. After toe soreness started - bought new tennis shoes. Continued pain even with new shoes.  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 Pain in toes affecting walking.  Noticed few months ago - toes appear to be more white  - past few to 6 months  No wounds, min numbness of toes of left foot. Father had circulation issues - PAD.     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   Anxiety 02/04/2015   Major depression, recurrent ([place]) 02/04/2015   Mild intermittent asthma without complication 19/62/2297   Osteoarthrosis, unspecified whether generalized or localized, shoulder region 07/04/2013   Past Medical History:  Diagnosis Date   Allergy    uses Flonase daily as needed and takes Loratadine daily    Anemia    only after birth of 2nd child   Arthritis    Asthma    uses Albuterol daily as needed;exercise induced   Cancer ([place]) 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, right cheek   Depression    takes Celexa daily   Depression    Phreesia 07/01/2020   GERD  (gastroesophageal reflux disease)    occ   History of colon polyps    History of shingles    5-6 yrs   Hyperlipidemia    Hypothyroid    Insomnia    takes Restoril nightly as needed   Interstitial cystitis    Osteoarthritis    Sjogren's syndrome ([place])    Spinal headache    after 1st C/S, o2sat low after shoulder surgery after went to room(51%),hard to urinate)   Thyroid disease    Phreesia 09/21/2019   Past Surgical History:  Procedure Laterality Date   CARPAL TUNNEL RELEASE Right 05/2007   CESAREAN SECTION  1981/1984   x 2   COLONOSCOPY     JOINT REPLACEMENT N/A    Phreesia 09/21/2019   TONSILLECTOMY AND ADENOIDECTOMY  age 59   TOTAL SHOULDER ARTHROPLASTY Right 07/04/2013   DR NORRIS   TOTAL SHOULDER ARTHROPLASTY Right 07/04/2013   Procedure: RIGHT TOTAL SHOULDER ARTHROPLASTY;  Surgeon: Augustin Schooling, MD;  Location: [facility];  Service: Orthopedics;  Laterality: Right;   TOTAL SHOULDER ARTHROPLASTY Left 03/12/2015   Procedure: LEFT SHOULDER TOTAL SHOULDER ARTHROPLASTY;  Surgeon: Netta Cedars, MD;  Location: [facility];  Service: Orthopedics;  Laterality: Left;   Allergies  Allergen Reactions   Diflucan [Fluconazole] Hives    unknown   Thimerosal Hives   Mercury Detox [Nutritional Supplements] Hives   Influenza Vaccine  Recombinant Rash    Mercury component and Thierosal in flu vaccine   Levaquin [Levofloxacin] Rash   Sulfa Antibiotics Rash   Prior to Admission medications   Medication Sig Start Date End Date Taking? Authorizing Provider  albuterol (PROAIR HFA) 108 (90 Base) MCG/ACT inhaler INHALE 2 PUFFS INTO THE LUNGS EVERY 6 (SIX) HOURS AS NEEDED FOR SHORTNESS OF BREATH. 09/24/19  Yes Wendie Agreste, MD  azelastine (ASTELIN) 0.1 % nasal spray Place 2 sprays into both nostrils 2 (two) times daily. Use in each nostril as directed 08/20/19  Yes Maximiano Coss, NP  azelastine (ASTELIN) 0.1 % nasal spray Place 1 spray into both nostrils 2 (two) times daily. Use in each nostril as  directed 09/30/20  Yes Maximiano Coss, NP  benzonatate (TESSALON PERLES) 100 MG capsule 1-2 capsules up to twice daily as needed for cough 02/10/21  Yes Colin Benton R, DO  cholecalciferol (VITAMIN D) 1000 UNITS tablet Take 1,000 Units by mouth daily.   Yes [provider]  citalopram (CELEXA) 40 MG tablet Take 1 tablet (40 mg total) by mouth daily. 03/29/21  Yes Megan Salon, MD  docusate sodium (COLACE) 100 MG capsule Take 400 mg by mouth at bedtime.    Yes [provider]  doxycycline (VIBRA-TABS) 100 MG tablet Take 1 tablet (100 mg total) by mouth 2 (two) times daily. 02/10/21  Yes Colin Benton R, DO  fluticasone (FLONASE) 50 MCG/ACT nasal spray USE 1-2 SPRAYS INTO EACH NOSTRIL ONCE A DAY AS NEEDED 06/05/19  Yes Delia Chimes A, MD  levothyroxine (SYNTHROID) 75 MCG tablet Take 1 tablet (75 mcg total) by mouth daily. 07/02/20  Yes Wendie Agreste, MD  loratadine (CLARITIN) 10 MG tablet Take 10 mg by mouth daily as needed for allergies.    Yes [provider]  temazepam (RESTORIL) 15 MG capsule TAKE 1 CAPSULE BY MOUTH EVERY DAY AT BEDTIME AS NEEDED 12/24/20  Yes Maximiano Coss, NP  vitamin B-12 (CYANOCOBALAMIN) 1000 MCG tablet Take 1,000 mcg by mouth every other day.    Yes [provider]   Social History   Socioeconomic History   Marital status: Married    Spouse name: Not on file   Number of children: 2   Years of education: Not on file   Highest education level: Not on file  Occupational History   Not on file  Tobacco Use   Smoking status: Never   Smokeless tobacco: Never  Vaping Use   Vaping Use: Never used  Substance and Sexual Activity   Alcohol use: Yes    Alcohol/week: 2.0 standard drinks    Types: 2 Glasses of wine per week   Drug use: No   Sexual activity: Not Currently    Partners: Male    Birth control/protection: Post-menopausal  Other Topics Concern   Not on file  Social History Narrative   Work or School: nanny for multiple  families      Home Situation: son a Theme park manager      Spiritual Beliefs: Christian      Lifestyle: no regular exercise, diet not great   Social Determinants of Health   Financial Resource Strain: Not on file  Food Insecurity: Not on file  Transportation Needs: Not on file  Physical Activity: Not on file  Stress: Not on file  Social Connections: Not on file  Intimate Partner Violence: Not on file    Review of Systems   Objective:   Vitals:   04/20/21 1600  Pulse: 74  Resp: 15  Temp: 98.1 F (36.7 C)  TempSrc: Temporal  SpO2: 97%  Weight: 154 lb 9.6 oz (70.1 kg)  Height: 5' 3.5" (1.613 m)     Physical Exam Constitutional:      General: She is not in acute distress.    Appearance: Normal appearance. She is well-developed.  HENT:     Head: Normocephalic and atraumatic.  Cardiovascular:     Rate and Rhythm: Normal rate.  Pulmonary:     Effort: Pulmonary effort is normal.  Musculoskeletal:     Comments: Left foot, DP pulse 2+, cap refill less than 1 second toes.  She does note that the white/pale appearance is not currently noted on her toes at this time. Tender to palpation over the distal greater than mid aspect of 4th toe on the left, some discomfort with IP flexion.  Minimal discomfort over mid aspect of third toe at IP joints.  No soft tissue swelling of toes, skin intact, no erythema or appreciable warmth.  No paronychia or skin changes noted.  Neurological:     Mental Status: She is alert and oriented to person, place, and time.  Psychiatric:        Mood and Affect: Mood normal.       Assessment & Plan:  Christine Valdez is a 71 y.o. female . Left foot pain - Plan: Ambulatory referral to Podiatry Toe pain, left - Plan: Ambulatory referral to Podiatry, DG Toe 3rd Left, DG Toe 4th Left  - ?overuse vs compresion with footwear. No sign of infection. No sign of critical limb/toe ischemia.  -Check xray, refer to podiatry, with footwear to be evaluated at  podiatry visit. Rtc precautions.   Change of skin color - Plan: Ambulatory referral to Vascular Surgery Family history of peripheral arterial disease - Plan: Ambulatory referral to Vascular Surgery  - appears to be neurovasc intact distally on exam, question decreased DP pulse slightly on left vs right. Consider ABI, but will refer to vascular to discuss. Rtc precautions.   No orders of the defined types were placed in this encounter.  Patient Instructions  Make sure footwear has sufficient room for toes at the end of the shoe.  I will order x-ray of your toes to be completed at the [facility] location.  I have also referred you to podiatry and to vascular specialist to evaluate for the circulation concern in the toes.  If any concerns on x-ray I will let you know.  Thank you for coming in today.    Signed,   Merri Ray, MD [place] 04/20/21 9:36 PM

## 2021-04-20 NOTE — Patient Instructions (Signed)
Make sure footwear has sufficient room for toes at the end of the shoe.  I will order x-ray of your toes to be completed at the [facility] location.  I have also referred you to podiatry and to vascular specialist to evaluate for the circulation concern in the toes.  If any concerns on x-ray I will let you know.  Thank you for coming in today.

## 2021-04-21 ENCOUNTER — Ambulatory Visit (INDEPENDENT_AMBULATORY_CARE_PROVIDER_SITE_OTHER)
Admission: RE | Admit: 2021-04-21 | Discharge: 2021-04-21 | Disposition: A | Discharge status: Home / Self Care | Payer: Medicare HMO | Source: Ambulatory Visit | Attending: Family Medicine | Admitting: Family Medicine

## 2021-04-21 ENCOUNTER — Other Ambulatory Visit: Payer: Self-pay

## 2021-04-21 DIAGNOSIS — M79675 Pain in left toe(s): Secondary | ICD-10-CM

## 2021-05-01 ENCOUNTER — Other Ambulatory Visit: Payer: Self-pay

## 2021-05-01 DIAGNOSIS — I739 Peripheral vascular disease, unspecified: Secondary | ICD-10-CM

## 2021-05-02 ENCOUNTER — Telehealth: Payer: Self-pay | Admitting: Family Medicine

## 2021-05-02 MED ORDER — TRAMADOL HCL 50 MG PO TABS: 50.0000 mg | ORAL_TABLET | Freq: Four times a day (QID) | ORAL | 0 refills | Status: DC | PRN

## 2021-05-02 NOTE — Telephone Encounter (Signed)
I called patient & she saw you for toe pain of 2nd & 3rd toes. She does have appropriate appointments lined up with vein & vascular as well as podiatry. She just is in so much pain that she an barely walk. She was hoping that you could call something in until she can be seen. Please advise?

## 2021-05-02 NOTE — Telephone Encounter (Signed)
Sorry to hear that pain has worsened. Try to avoid weightbearing until seen by podiatry. Tramadol sent in temporarily.   If any redness or swelling should be seen right away.

## 2021-05-02 NOTE — Telephone Encounter (Signed)
Pt called in stating that the pain in her toe is getting worse. She wanted to know if something can be called in for her? She is going to [facility] on 05/04/21.  Please advise pt uses [facility] on [address] in [place]

## 2021-05-03 NOTE — Telephone Encounter (Signed)
Pt is aware of this and expresses her understanding

## 2021-05-03 NOTE — Telephone Encounter (Signed)
Called lm for pt to call back to discuss directions from Dr Carlota Raspberry.

## 2021-05-04 ENCOUNTER — Other Ambulatory Visit: Payer: Self-pay

## 2021-05-04 ENCOUNTER — Ambulatory Visit: Payer: Medicare HMO | Admitting: Podiatry

## 2021-05-04 ENCOUNTER — Ambulatory Visit (INDEPENDENT_AMBULATORY_CARE_PROVIDER_SITE_OTHER): Payer: Medicare HMO

## 2021-05-04 DIAGNOSIS — M7742 Metatarsalgia, left foot: Secondary | ICD-10-CM

## 2021-05-04 DIAGNOSIS — M7752 Other enthesopathy of left foot: Secondary | ICD-10-CM | POA: Diagnosis not present

## 2021-05-04 MED ORDER — METHYLPREDNISOLONE 4 MG PO TBPK: ORAL_TABLET | ORAL | 0 refills | Status: DC

## 2021-05-04 MED ORDER — MELOXICAM 15 MG PO TABS: 15.0000 mg | ORAL_TABLET | Freq: Every day | ORAL | 0 refills | Status: DC

## 2021-05-05 ENCOUNTER — Other Ambulatory Visit: Payer: Self-pay | Admitting: Podiatry

## 2021-05-05 DIAGNOSIS — M7742 Metatarsalgia, left foot: Secondary | ICD-10-CM

## 2021-05-06 ENCOUNTER — Encounter: Payer: Self-pay | Admitting: Podiatry

## 2021-05-09 NOTE — Telephone Encounter (Signed)
Please advise 

## 2021-05-10 DIAGNOSIS — M7752 Other enthesopathy of left foot: Secondary | ICD-10-CM | POA: Diagnosis not present

## 2021-05-10 MED ORDER — BETAMETHASONE SOD PHOS & ACET 6 (3-3) MG/ML IJ SUSP
3.0000 mg | Freq: Once | INTRAMUSCULAR | Status: AC
  Administered 2021-05-10: 3 mg via INTRA_ARTICULAR

## 2021-05-10 NOTE — Progress Notes (Signed)
HPI: 71 y.o. female presenting today as a new patient for evaluation of pain and tenderness to the third and fourth digits of the left foot this been present for few months now.  Gradual onset.  She denies a history of injury.  Aggravated by walking and applying pressure.  She is tried different shoes with no significant improvement.  She actively exercises and is a member of the [employer] program  Past Medical History:  Diagnosis Date   Allergy    uses Flonase daily as needed and takes Loratadine daily    Anemia    only after birth of 2nd child   Arthritis    Asthma    uses Albuterol daily as needed;exercise induced   Cancer ([place]) 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, right cheek   Depression    takes Celexa daily   Depression    Phreesia 07/01/2020   GERD (gastroesophageal reflux disease)    occ   History of colon polyps    History of shingles    5-6 yrs   Hyperlipidemia    Hypothyroid    Insomnia    takes Restoril nightly as needed   Interstitial cystitis    Osteoarthritis    Sjogren's syndrome ([place])    Spinal headache    after 1st C/S, o2sat low after shoulder surgery after went to room(51%),hard to urinate)   Thyroid disease    Phreesia 09/21/2019    Past Surgical History:  Procedure Laterality Date   CARPAL TUNNEL RELEASE Right 05/2007   CESAREAN SECTION  1981/1984   x 2   COLONOSCOPY     JOINT REPLACEMENT N/A    Phreesia 09/21/2019   TONSILLECTOMY AND ADENOIDECTOMY  age 7   TOTAL SHOULDER ARTHROPLASTY Right 07/04/2013   DR NORRIS   TOTAL SHOULDER ARTHROPLASTY Right 07/04/2013   Procedure: RIGHT TOTAL SHOULDER ARTHROPLASTY;  Surgeon: Augustin Schooling, MD;  Location: [facility];  Service: Orthopedics;  Laterality: Right;   TOTAL SHOULDER ARTHROPLASTY Left 03/12/2015   Procedure: LEFT SHOULDER TOTAL SHOULDER ARTHROPLASTY;  Surgeon: Netta Cedars, MD;  Location: [facility];  Service: Orthopedics;  Laterality: Left;    Allergies  Allergen Reactions   Diflucan [Fluconazole] Hives     unknown   Thimerosal Hives   Mercury Detox [Nutritional Supplements] Hives   Influenza Vaccine Recombinant Rash    Mercury component and Thierosal in flu vaccine   Levaquin [Levofloxacin] Rash   Sulfa Antibiotics Rash     Physical Exam: General: The patient is alert and oriented x3 in no acute distress.  Dermatology: Skin is warm, dry and supple bilateral lower extremities. Negative for open lesions or macerations.  Vascular: Palpable pedal pulses bilaterally. Capillary refill within normal limits.  Negative for any significant edema or erythema  Neurological: Light touch and protective threshold grossly intact  Musculoskeletal Exam: No pedal deformities noted.  There is some pain on palpation or range of motion of the third and fourth MTP joints of the left foot.  There is no pain with lateral compression of the metatarsal heads and there is no pain to the intermetatarsal space between the metatarsal heads that would be concerning for a Morton's neuroma  Radiographic Exam:  Normal osseous mineralization. Joint spaces preserved. No fracture/dislocation/boney destruction.    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 Patient evaluated. X-Rays reviewed.  2.  Injection of 0.5 cc Celestone Soluspan injected around the third and fourth MTP joints of the left foot  3.  Prescription for Medrol Dosepak 4. Prescription for Meloxicam 15mg  daily after completion of the dosepak 5. Advise against treadmill walking and exercise that creates repetitive injury to the forefoot 6.  Continue wearing good supportive shoes and sneakers.  Advised against going barefoot 7.  Return to clinic in 4 weeks  *[employer] sneakers     Edrick Kins, DPM [facility]  Dr. Edrick Kins, DPM    [address], [place] [address]                Office [phone]  Fax [phone]

## 2021-05-11 ENCOUNTER — Encounter: Payer: Self-pay | Admitting: Vascular Surgery

## 2021-05-11 ENCOUNTER — Other Ambulatory Visit: Payer: Self-pay

## 2021-05-11 ENCOUNTER — Ambulatory Visit (HOSPITAL_COMMUNITY)
Admission: RE | Admit: 2021-05-11 | Discharge: 2021-05-11 | Disposition: A | Discharge status: Home / Self Care | Payer: Medicare HMO | Source: Ambulatory Visit | Attending: Vascular Surgery | Admitting: Vascular Surgery

## 2021-05-11 ENCOUNTER — Ambulatory Visit: Payer: Medicare HMO | Admitting: Vascular Surgery

## 2021-05-11 VITALS — BP 122/84 | HR 90 | Temp 98.0°F | Resp 20 | Ht 63.5 in | Wt 151.0 lb

## 2021-05-11 DIAGNOSIS — I7300 Raynaud's syndrome without gangrene: Secondary | ICD-10-CM | POA: Diagnosis not present

## 2021-05-11 DIAGNOSIS — I739 Peripheral vascular disease, unspecified: Secondary | ICD-10-CM

## 2021-05-11 NOTE — Progress Notes (Signed)
Patient ID: Christine Valdez, female   DOB: March 17, 1951, 71 y.o.   MRN: [id number]  Reason for Consult: New Patient (Initial Visit)   Referred by Wendie Agreste, MD  Subjective:     HPI:  Christine Valdez is a 71 y.o. female history of Sjogren syndrome.  She does not have any history of vascular disease.  Her father did have vascular disease but is a lifelong smoker.  Recently she has noted white discoloration of her toes which is quite painful.  She has treated this with standing in the warm bath water and with wearing thick socks which does help.  She has no tissue loss or ulceration.  She has no issues with her hands.  No personal or family history of aneurysm disease she denies stroke, TIA or amaurosis.  Past Medical History:  Diagnosis Date   Allergy    uses Flonase daily as needed and takes Loratadine daily    Anemia    only after birth of 2nd child   Arthritis    Asthma    uses Albuterol daily as needed;exercise induced   Cancer ([place]) 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, right cheek   Depression    takes Celexa daily   Depression    Phreesia 07/01/2020   GERD (gastroesophageal reflux disease)    occ   History of colon polyps    History of shingles    5-6 yrs   Hyperlipidemia    Hypothyroid    Insomnia    takes Restoril nightly as needed   Interstitial cystitis    Osteoarthritis    Sjogren's syndrome ([place])    Spinal headache    after 1st C/S, o2sat low after shoulder surgery after went to room(51%),hard to urinate)   Thyroid disease    Phreesia 09/21/2019   Family History  Problem Relation Age of Onset   Breast cancer Sister 28       stage 73, chemo and radiation   Esophageal cancer Sister    Diabetes Brother    Epilepsy Daughter    Hypertension Mother    Osteoporosis Mother    Diabetes Father    Hypertension Father    Lung cancer Paternal Aunt    Past Surgical History:  Procedure Laterality Date   CARPAL TUNNEL RELEASE Right 05/2007   CESAREAN SECTION   1981/1984   x 2   COLONOSCOPY     JOINT REPLACEMENT N/A    Phreesia 09/21/2019   TONSILLECTOMY AND ADENOIDECTOMY  age 14   TOTAL SHOULDER ARTHROPLASTY Right 07/04/2013   DR NORRIS   TOTAL SHOULDER ARTHROPLASTY Right 07/04/2013   Procedure: RIGHT TOTAL SHOULDER ARTHROPLASTY;  Surgeon: Augustin Schooling, MD;  Location: [facility];  Service: Orthopedics;  Laterality: Right;   TOTAL SHOULDER ARTHROPLASTY Left 03/12/2015   Procedure: LEFT SHOULDER TOTAL SHOULDER ARTHROPLASTY;  Surgeon: Netta Cedars, MD;  Location: [facility];  Service: Orthopedics;  Laterality: Left;    Short Social History:  Social History   Tobacco Use   Smoking status: Never   Smokeless tobacco: Never  Substance Use Topics   Alcohol use: Yes    Alcohol/week: 2.0 standard drinks    Types: 2 Glasses of wine per week    Allergies  Allergen Reactions   Diflucan [Fluconazole] Hives    unknown   Thimerosal Hives   Mercury Detox [Nutritional Supplements] Hives   Influenza Vaccine Recombinant Rash    Mercury component and Thierosal in flu vaccine   Levaquin [Levofloxacin] Rash  Sulfa Antibiotics Rash    Current Outpatient Medications  Medication Sig Dispense Refill   albuterol (PROAIR HFA) 108 (90 Base) MCG/ACT inhaler INHALE 2 PUFFS INTO THE LUNGS EVERY 6 (SIX) HOURS AS NEEDED FOR SHORTNESS OF BREATH. 18 g 0   cholecalciferol (VITAMIN D) 1000 UNITS tablet Take 1,000 Units by mouth daily.     citalopram (CELEXA) 40 MG tablet Take 1 tablet (40 mg total) by mouth daily. 90 tablet 3   docusate sodium (COLACE) 100 MG capsule Take 400 mg by mouth at bedtime.      fluticasone (FLONASE) 50 MCG/ACT nasal spray USE 1-2 SPRAYS INTO EACH NOSTRIL ONCE A DAY AS NEEDED 48 mL 1   levothyroxine (SYNTHROID) 75 MCG tablet Take 1 tablet (75 mcg total) by mouth daily. 90 tablet 3   loratadine (CLARITIN) 10 MG tablet Take 10 mg by mouth daily as needed for allergies.      meloxicam (MOBIC) 15 MG tablet Take 1 tablet (15 mg total) by mouth daily.  30 tablet 0   temazepam (RESTORIL) 15 MG capsule TAKE 1 CAPSULE BY MOUTH EVERY DAY AT BEDTIME AS NEEDED 30 capsule 0   traMADol (ULTRAM) 50 MG tablet Take 1 tablet (50 mg total) by mouth every 6 (six) hours as needed. 20 tablet 0   vitamin B-12 (CYANOCOBALAMIN) 1000 MCG tablet Take 1,000 mcg by mouth every other day.      No current facility-administered medications for this visit.    Review of Systems  Constitutional:  Constitutional negative. HENT: HENT negative.  Eyes: Eyes negative.  Respiratory: Respiratory negative.  Cardiovascular: Cardiovascular negative.  GI: Gastrointestinal negative.  Musculoskeletal:       White discoloration of toes Skin: Skin negative.  Neurological: Neurological negative. Hematologic: Hematologic/lymphatic negative.  Psychiatric: Psychiatric negative.       Objective:  Objective  Vitals:   05/11/21 1545  BP: 122/84  Pulse: 90  Resp: 20  Temp: 98 F (36.7 C)  SpO2: 95%     Physical Exam HENT:     Head: Normocephalic.     Nose:     Comments: Wearing a mask Eyes:     Pupils: Pupils are equal, round, and reactive to light.  Cardiovascular:     Rate and Rhythm: Normal rate.     Pulses: Normal pulses.  Pulmonary:     Effort: Pulmonary effort is normal.  Abdominal:     General: Abdomen is flat.     Palpations: Abdomen is soft.  Musculoskeletal:        General: Normal range of motion.     Cervical back: Normal range of motion and neck supple.     Right lower leg: No edema.     Left lower leg: No edema.  Skin:    General: Skin is warm and dry.     Capillary Refill: Capillary refill takes less than 2 seconds.  Neurological:     General: No focal deficit present.     Mental Status: She is alert.  Psychiatric:        Mood and Affect: Mood normal.        Behavior: Behavior normal.        Thought Content: Thought content normal.    Data: ABI Findings:  +---------+------------------+-----+---------+--------+   Right     Rt  Pressure (mmHg) Index Waveform  Comment    +---------+------------------+-----+---------+--------+   Brachial  142                                           +---------+------------------+-----+---------+--------+  PTA       165                1.16  triphasic            +---------+------------------+-----+---------+--------+   DP        176                1.24  triphasic            +---------+------------------+-----+---------+--------+   Great Toe 136                0.96                       +---------+------------------+-----+---------+--------+   +---------+------------------+-----+---------+-------+   Left      Lt Pressure (mmHg) Index Waveform  Comment   +---------+------------------+-----+---------+-------+   Brachial  141                                          +---------+------------------+-----+---------+-------+   PTA       171                1.20  triphasic           +---------+------------------+-----+---------+-------+   DP        183                1.29  triphasic           +---------+------------------+-----+---------+-------+   Great Toe 113                0.80                      +---------+------------------+-----+---------+-------+   +-------+-----------+-----------+------------+------------+   ABI/TBI Today's ABI Today's TBI Previous ABI Previous TBI   +-------+-----------+-----------+------------+------------+   Right   1.24        0.96                                    +-------+-----------+-----------+------------+------------+   Left    1.29        0.80                                    +-------+-----------+-----------+------------+------------+    Summary:  Right: Resting right ankle-brachial index is within normal range. No  evidence of significant right lower extremity arterial disease. The right  toe-brachial index is normal.   Left: Resting left ankle-brachial index is within normal range. No  evidence of significant left lower extremity arterial  disease. The left  toe-brachial index is normal.      Assessment/Plan:    71 year old female with history of Sjogren syndrome.  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  We discussed keeping her toes warm which she has done with symptom relief.  No need for vascular invention.  She can follow-up with me on an as-needed basis     Waynetta Sandy MD [facility]

## 2021-05-18 ENCOUNTER — Other Ambulatory Visit: Payer: Self-pay

## 2021-05-18 ENCOUNTER — Encounter: Payer: Self-pay | Admitting: Registered Nurse

## 2021-05-18 ENCOUNTER — Ambulatory Visit (INDEPENDENT_AMBULATORY_CARE_PROVIDER_SITE_OTHER): Payer: Medicare HMO | Admitting: Registered Nurse

## 2021-05-18 VITALS — BP 136/78 | HR 84 | Temp 98.0°F | Resp 18 | Ht 63.5 in | Wt 153.2 lb

## 2021-05-18 DIAGNOSIS — J019 Acute sinusitis, unspecified: Secondary | ICD-10-CM

## 2021-05-18 DIAGNOSIS — B9689 Other specified bacterial agents as the cause of diseases classified elsewhere: Secondary | ICD-10-CM | POA: Diagnosis not present

## 2021-05-18 MED ORDER — AMOXICILLIN-POT CLAVULANATE 875-125 MG PO TABS: 1.0000 | ORAL_TABLET | Freq: Two times a day (BID) | ORAL | 0 refills | Status: DC

## 2021-05-18 MED ORDER — PREDNISONE 10 MG (21) PO TBPK: ORAL_TABLET | ORAL | 0 refills | Status: DC

## 2021-05-18 NOTE — Patient Instructions (Addendum)
Christine Valdez -   Doristine Devoid to see you! Sorry it's when you're not feeling well.  Take Augmentin as prescribed. Finish the entire course even if you're feeling better Same for prednisone Continue mucinex, flonase, neti pot, etc.  Call Monday if not improving  Thanks  Rich     If you have lab work done today you will be contacted with your lab results within the next 2 weeks.  If you have not heard from [place] then please contact us. The fastest way to get your results is to register for My Chart.   IF you received an x-ray today, you will receive an invoice from [facility]. Please contact [facility] at [phone] with questions or concerns regarding your invoice.   IF you received labwork today, you will receive an invoice from [facility]. Please contact [facility] at [phone] with questions or concerns regarding your invoice.   Our billing staff will not be able to assist you with questions regarding bills from these companies.  You will be contacted with the lab results as soon as they are available. The fastest way to get your results is to activate your My Chart account. Instructions are located on the last page of this paperwork. If you have not heard from [place] regarding the results in 2 weeks, please contact this office.

## 2021-05-18 NOTE — Progress Notes (Signed)
Established Patient Office Visit  Subjective:  Patient ID: Milli Woolridge, female    DOB: 02/24/1951  Age: 71 y.o. MRN: [id number]  CC:  Chief Complaint  Patient presents with   Headache    Patient states she is experiencing a cough , headache ,and some head congestion. Patient states she had a cold for the last 2 weeks and these symptoms are lingering and now thinks she has a sinus infection.     HPI Hebe Merriwether presents for Headache  Cough, sinus congestion Onset two weeks ago Originally onset as a bad cold - tested negative for COVID at home. Got a little better, then deteriorated  Sinus pressure, eye pressure, pnd  Has taken OTC mucinex, flonase, etc. Limited relief  Past Medical History:  Diagnosis Date   Allergy    uses Flonase daily as needed and takes Loratadine daily    Anemia    only after birth of 2nd child   Arthritis    Asthma    uses Albuterol daily as needed;exercise induced   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, right cheek   Depression    takes Celexa daily   Depression    Phreesia 07/01/2020   GERD (gastroesophageal reflux disease)    occ   History of colon polyps    History of shingles    5-6 yrs   Hyperlipidemia    Hypothyroid    Insomnia    takes Restoril nightly as needed   Interstitial cystitis    Osteoarthritis    Sjogren's syndrome ([facility])    Spinal headache    after 1st C/S, o2sat low after shoulder surgery after went to room(51%),hard to urinate)   Thyroid disease    Phreesia 09/21/2019    Past Surgical History:  Procedure Laterality Date   CARPAL TUNNEL RELEASE Right 05/2007   CESAREAN SECTION  1981/1984   x 2   COLONOSCOPY     JOINT REPLACEMENT N/A    Phreesia 09/21/2019   TONSILLECTOMY AND ADENOIDECTOMY  age 21   TOTAL SHOULDER ARTHROPLASTY Right 07/04/2013   DR NORRIS   TOTAL SHOULDER ARTHROPLASTY Right 07/04/2013   Procedure: RIGHT TOTAL SHOULDER ARTHROPLASTY;  Surgeon: Verlee Rossetti, MD;  Location:  [facility] OR;  Service: Orthopedics;  Laterality: Right;   TOTAL SHOULDER ARTHROPLASTY Left 03/12/2015   Procedure: LEFT SHOULDER TOTAL SHOULDER ARTHROPLASTY;  Surgeon: Beverely Low, MD;  Location: [facility] OR;  Service: Orthopedics;  Laterality: Left;    Family History  Problem Relation Age of Onset   Breast cancer Sister 69       stage 4, chemo and radiation   Esophageal cancer Sister    Diabetes Brother    Epilepsy Daughter    Hypertension Mother    Osteoporosis Mother    Diabetes Father    Hypertension Father    Lung cancer Paternal Aunt     Social History   Socioeconomic History   Marital status: Married    Spouse name: Not on file   Number of children: 2   Years of education: Not on file   Highest education level: Not on file  Occupational History   Not on file  Tobacco Use   Smoking status: Never   Smokeless tobacco: Never  Vaping Use   Vaping Use: Never used  Substance and Sexual Activity   Alcohol use: Yes    Alcohol/week: 2.0 standard drinks    Types: 2 Glasses of wine per week   Drug use: No  Sexual activity: Not Currently    Partners: Male    Birth control/protection: Post-menopausal  Other Topics Concern   Not on file  Social History Narrative   Work or School: nanny for multiple families      Home Situation: son a Theme park manager      Spiritual Beliefs: Christian      Lifestyle: no regular exercise, diet not great   Social Determinants of Health   Financial Resource Strain: Not on file  Food Insecurity: Not on file  Transportation Needs: Not on file  Physical Activity: Not on file  Stress: Not on file  Social Connections: Not on file  Intimate Partner Violence: Not on file    Outpatient Medications Prior to Visit  Medication Sig Dispense Refill   albuterol (PROAIR HFA) 108 (90 Base) MCG/ACT inhaler INHALE 2 PUFFS INTO THE LUNGS EVERY 6 (SIX) HOURS AS NEEDED FOR SHORTNESS OF BREATH. 18 g 0   cholecalciferol (VITAMIN D) 1000 UNITS tablet Take 1,000 Units by  mouth daily.     citalopram (CELEXA) 40 MG tablet Take 1 tablet (40 mg total) by mouth daily. 90 tablet 3   docusate sodium (COLACE) 100 MG capsule Take 400 mg by mouth at bedtime.      fluticasone (FLONASE) 50 MCG/ACT nasal spray USE 1-2 SPRAYS INTO EACH NOSTRIL ONCE A DAY AS NEEDED 48 mL 1   levothyroxine (SYNTHROID) 75 MCG tablet Take 1 tablet (75 mcg total) by mouth daily. 90 tablet 3   loratadine (CLARITIN) 10 MG tablet Take 10 mg by mouth daily as needed for allergies.      meloxicam (MOBIC) 15 MG tablet Take 1 tablet (15 mg total) by mouth daily. 30 tablet 0   temazepam (RESTORIL) 15 MG capsule TAKE 1 CAPSULE BY MOUTH EVERY DAY AT BEDTIME AS NEEDED 30 capsule 0   traMADol (ULTRAM) 50 MG tablet Take 1 tablet (50 mg total) by mouth every 6 (six) hours as needed. 20 tablet 0   vitamin B-12 (CYANOCOBALAMIN) 1000 MCG tablet Take 1,000 mcg by mouth every other day.      No facility-administered medications prior to visit.    Allergies  Allergen Reactions   Diflucan [Fluconazole] Hives    unknown   Thimerosal Hives   Mercury Detox [Nutritional Supplements] Hives   Influenza Vaccine Recombinant Rash    Mercury component and Thierosal in flu vaccine   Levaquin [Levofloxacin] Rash   Sulfa Antibiotics Rash    ROS Review of Systems  Constitutional: Negative.   HENT: Negative.    Eyes: Negative.   Respiratory: Negative.    Cardiovascular: Negative.   Gastrointestinal: Negative.   Genitourinary: Negative.   Musculoskeletal: Negative.   Skin: Negative.   Neurological: Negative.   Psychiatric/Behavioral: Negative.    All other systems reviewed and are negative.    Objective:    Physical Exam Vitals and nursing note reviewed.  Constitutional:      General: She is not in acute distress.    Appearance: Normal appearance. She is normal weight. She is not ill-appearing, toxic-appearing or diaphoretic.  HENT:     Head: Normocephalic and atraumatic.     Mouth/Throat:     Mouth:  Mucous membranes are moist.     Pharynx: Oropharynx is clear.  Eyes:     Extraocular Movements: Extraocular movements intact.     Pupils: Pupils are equal, round, and reactive to light.  Neck:     Meningeal: Brudzinski's sign and Kernig's sign absent.  Cardiovascular:  Rate and Rhythm: Normal rate and regular rhythm.     Heart sounds: Normal heart sounds. No murmur heard.   No friction rub. No gallop.  Pulmonary:     Effort: Pulmonary effort is normal. No respiratory distress.     Breath sounds: Normal breath sounds. No stridor. No wheezing, rhonchi or rales.  Chest:     Chest wall: No tenderness.  Musculoskeletal:     Cervical back: Normal range of motion. No rigidity.  Lymphadenopathy:     Cervical: No cervical adenopathy.  Skin:    General: Skin is warm and dry.  Neurological:     General: No focal deficit present.     Mental Status: She is alert and oriented to person, place, and time. Mental status is at baseline.  Psychiatric:        Mood and Affect: Mood normal.        Behavior: Behavior normal.        Thought Content: Thought content normal.        Judgment: Judgment normal.    BP 136/78    Pulse 84    Temp 98 F (36.7 C) (Temporal)    Resp 18    Ht 5' 3.5" (1.613 m)    Wt 153 lb 3.2 oz (69.5 kg)    LMP 04/11/2003 (Approximate)    SpO2 99%    BMI 26.71 kg/m  Wt Readings from Last 3 Encounters:  05/18/21 153 lb 3.2 oz (69.5 kg)  05/11/21 151 lb (68.5 kg)  04/20/21 154 lb 9.6 oz (70.1 kg)     Health Maintenance Due  Topic Date Due   Pneumonia Vaccine 62+ Years old (3) 04/22/2019   COVID-19 Vaccine (4 - Booster for Pfizer series) 02/10/2020    There are no preventive care reminders to display for this patient.  Lab Results  Component Value Date   TSH 1.79 12/23/2020   Lab Results  Component Value Date   WBC 5.0 12/23/2020   HGB 13.4 12/23/2020   HCT 40.0 12/23/2020   MCV 90.1 12/23/2020   PLT 171.0 12/23/2020   Lab Results  Component Value Date    NA 138 12/23/2020   K 4.0 12/23/2020   CO2 26 12/23/2020   GLUCOSE 84 12/23/2020   BUN 20 12/23/2020   CREATININE 0.98 12/23/2020   BILITOT 0.5 12/23/2020   ALKPHOS 49 12/23/2020   AST 17 12/23/2020   ALT 14 12/23/2020   PROT 6.7 12/23/2020   ALBUMIN 4.3 12/23/2020   CALCIUM 10.2 12/23/2020   ANIONGAP 9 03/13/2015   EGFR 70 06/25/2020   GFR 59.45 (L) 12/23/2020   Lab Results  Component Value Date   CHOL 224 (H) 12/23/2020   Lab Results  Component Value Date   HDL 64.00 12/23/2020   Lab Results  Component Value Date   LDLCALC 140 (H) 12/23/2020   Lab Results  Component Value Date   TRIG 102.0 12/23/2020   Lab Results  Component Value Date   CHOLHDL 4 12/23/2020   Lab Results  Component Value Date   HGBA1C 6.0 12/23/2020      Assessment & Plan:   Problem List Items Addressed This Visit   None Visit Diagnoses     Acute bacterial rhinosinusitis    -  Primary   Relevant Medications   amoxicillin-clavulanate (AUGMENTIN) 875-125 MG tablet   predniSONE (STERAPRED UNI-PAK 21 TAB) 10 MG (21) TBPK tablet       Meds ordered this encounter  Medications   amoxicillin-clavulanate (  AUGMENTIN) 875-125 MG tablet    Sig: Take 1 tablet by mouth 2 (two) times daily.    Dispense:  20 tablet    Refill:  0    Order Specific Question:   Supervising Provider    Answer:   Carlota Raspberry, JEFFREY R [[id number]]   predniSONE (STERAPRED UNI-PAK 21 TAB) 10 MG (21) TBPK tablet    Sig: Take per package instructions. Do not skip doses. Finish entire supply.    Dispense:  1 each    Refill:  0    Order Specific Question:   Supervising Provider    Answer:   Carlota Raspberry, JEFFREY R [[id number]]    Follow-up: Return if symptoms worsen or fail to improve.   PLAN ABRS. Tx with augmentin po bid x 10 days, prednisone taper Return if worsening or failing to improve Continue OTC supportive care, rest, hydrate Patient encouraged to call clinic with any questions, comments, or concerns.  Maximiano Coss,  NP

## 2021-05-31 ENCOUNTER — Other Ambulatory Visit: Payer: Self-pay | Admitting: Podiatry

## 2021-06-01 ENCOUNTER — Ambulatory Visit: Payer: Medicare HMO | Admitting: Podiatry

## 2021-06-01 ENCOUNTER — Other Ambulatory Visit: Payer: Self-pay

## 2021-06-01 DIAGNOSIS — M7752 Other enthesopathy of left foot: Secondary | ICD-10-CM

## 2021-06-01 NOTE — Progress Notes (Signed)
HPI: 71 y.o. female presenting today for follow-up evaluation of third and fourth MTPJ capsulitis to the left foot.  Patient states that she is doing much better.  She is very satisfied and she has been active and exercising with minimal to no pain.  She presents for further treatment and evaluation  Past Medical History:  Diagnosis Date   Allergy    uses Flonase daily as needed and takes Loratadine daily    Anemia    only after birth of 2nd child   Arthritis    Asthma    uses Albuterol daily as needed;exercise induced   Cancer ([place]) 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, right cheek   Depression    takes Celexa daily   Depression    Phreesia 07/01/2020   GERD (gastroesophageal reflux disease)    occ   History of colon polyps    History of shingles    5-6 yrs   Hyperlipidemia    Hypothyroid    Insomnia    takes Restoril nightly as needed   Interstitial cystitis    Osteoarthritis    Sjogren's syndrome ([place])    Spinal headache    after 1st C/S, o2sat low after shoulder surgery after went to room(51%),hard to urinate)   Thyroid disease    Phreesia 09/21/2019    Past Surgical History:  Procedure Laterality Date   CARPAL TUNNEL RELEASE Right 05/2007   CESAREAN SECTION  1981/1984   x 2   COLONOSCOPY     JOINT REPLACEMENT N/A    Phreesia 09/21/2019   TONSILLECTOMY AND ADENOIDECTOMY  age 48   TOTAL SHOULDER ARTHROPLASTY Right 07/04/2013   DR NORRIS   TOTAL SHOULDER ARTHROPLASTY Right 07/04/2013   Procedure: RIGHT TOTAL SHOULDER ARTHROPLASTY;  Surgeon: Augustin Schooling, MD;  Location: [facility];  Service: Orthopedics;  Laterality: Right;   TOTAL SHOULDER ARTHROPLASTY Left 03/12/2015   Procedure: LEFT SHOULDER TOTAL SHOULDER ARTHROPLASTY;  Surgeon: Netta Cedars, MD;  Location: [facility];  Service: Orthopedics;  Laterality: Left;    Allergies  Allergen Reactions   Diflucan [Fluconazole] Hives    unknown   Thimerosal Hives   Mercury Detox [Nutritional Supplements] Hives   Influenza Vaccine  Recombinant Rash    Mercury component and Thierosal in flu vaccine   Levaquin [Levofloxacin] Rash   Sulfa Antibiotics Rash     Physical Exam: General: The patient is alert and oriented x3 in no acute distress.  Dermatology: Skin is warm, dry and supple bilateral lower extremities. Negative for open lesions or macerations.  Vascular: Palpable pedal pulses bilaterally. Capillary refill within normal limits.  Negative for any significant edema or erythema  Neurological: Light touch and protective threshold grossly intact  Musculoskeletal Exam: No pedal deformities noted today there is minimal pain on palpation to the third and fourth MTPJ of the foot.  Overall she is doing much better  Assessment: 1.  Third and fourth MTP capsulitis left   Plan of Care:  1. Patient evaluated.  2.  Patient states that she is significantly improved.  She is very satisfied with the improvement over the past few weeks. 3.  Patient may slowly increase to full activity no restrictions.  Recommend that she does not do too much too soon 4.  Advised against the treadmill and recommend elliptical or bicycle for cardio 5.  Continue meloxicam 15 mg daily as needed 6.  Return to clinic as needed  *Silver sneakers      Edrick Kins, DPM [facility]  Dr. Edrick Kins, DPM    [address], [place] [address]                Office [phone]  Fax [phone]

## 2021-06-27 ENCOUNTER — Encounter: Payer: Self-pay | Admitting: Family Medicine

## 2021-06-27 NOTE — Telephone Encounter (Signed)
FYI for Wednesday appt  ?

## 2021-06-29 ENCOUNTER — Ambulatory Visit (INDEPENDENT_AMBULATORY_CARE_PROVIDER_SITE_OTHER): Payer: Medicare HMO | Admitting: Family Medicine

## 2021-06-29 ENCOUNTER — Encounter: Payer: Self-pay | Admitting: Family Medicine

## 2021-06-29 VITALS — BP 122/82 | HR 83 | Temp 98.0°F | Ht 63.5 in | Wt 156.4 lb

## 2021-06-29 DIAGNOSIS — R69 Illness, unspecified: Secondary | ICD-10-CM | POA: Diagnosis not present

## 2021-06-29 DIAGNOSIS — F418 Other specified anxiety disorders: Secondary | ICD-10-CM | POA: Diagnosis not present

## 2021-06-29 DIAGNOSIS — G4700 Insomnia, unspecified: Secondary | ICD-10-CM

## 2021-06-29 DIAGNOSIS — L680 Hirsutism: Secondary | ICD-10-CM

## 2021-06-29 DIAGNOSIS — R5383 Other fatigue: Secondary | ICD-10-CM | POA: Diagnosis not present

## 2021-06-29 MED ORDER — HYDROXYZINE HCL 10 MG PO TABS: 10.0000 mg | ORAL_TABLET | Freq: Three times a day (TID) | ORAL | 0 refills | Status: DC | PRN

## 2021-06-29 NOTE — Patient Instructions (Addendum)
We can check thyroid and other labs with physical this Monday. ?No change in celexa for now until labs back, then can discuss next step.  ?I would consider meeting with therapist, here are a few numbers.  ?Discuss the increased hair growth with gynecology if possible, but will check thyroid test next week.  ?Hydroxyzine at bedtime for now.  ? ?Here are a few options for counseling: ? ?[facility]: ? ?[phone] ? ?[facility] ?[phone] ? ?Insomnia ?Insomnia is a sleep disorder that makes it difficult to fall asleep or stay asleep. Insomnia can cause fatigue, low energy, difficulty concentrating, mood swings, and poor performance at work or school. ?There are three different ways to classify insomnia: ?Difficulty falling asleep. ?Difficulty staying asleep. ?Waking up too early in the morning. ?Any type of insomnia can be long-term (chronic) or short-term (acute). Both are common. Short-term insomnia usually lasts for three months or less. Chronic insomnia occurs at least three times a week for longer than three months. ?What are the causes? ?Insomnia may be caused by another condition, situation, or substance, such as: ?Anxiety. ?Certain medicines. ?Gastroesophageal reflux disease (GERD) or other gastrointestinal conditions. ?Asthma or other breathing conditions. ?Restless legs syndrome, sleep apnea, or other sleep disorders. ?Chronic pain. ?Menopause. ?Stroke. ?Abuse of alcohol, tobacco, or illegal drugs. ?Mental health conditions, such as depression. ?Caffeine. ?Neurological disorders, such as Alzheimer's disease. ?An overactive thyroid (hyperthyroidism). ?Sometimes, the cause of insomnia may not be known. ?What increases the risk? ?Risk factors for insomnia include: ?Gender. Women are affected more often than men. ?Age. Insomnia is more common as you get older. ?Stress. ?Lack of exercise. ?Irregular work schedule or working night shifts. ?Traveling between different  time zones. ?Certain medical and mental health conditions. ?What are the signs or symptoms? ?If you have insomnia, the main symptom is having trouble falling asleep or having trouble staying asleep. This may lead to other symptoms, such as: ?Feeling fatigued or having low energy. ?Feeling nervous about going to sleep. ?Not feeling rested in the morning. ?Having trouble concentrating. ?Feeling irritable, anxious, or depressed. ?How is this diagnosed? ?This condition may be diagnosed based on: ?Your symptoms and medical history. Your health care provider may ask about: ?Your sleep habits. ?Any medical conditions you have. ?Your mental health. ?A physical exam. ?How is this treated? ?Treatment for insomnia depends on the cause. Treatment may focus on treating an underlying condition that is causing insomnia. Treatment may also include: ?Medicines to help you sleep. ?Counseling or therapy. ?Lifestyle adjustments to help you sleep better. ?Follow these instructions at home: ?Eating and drinking ? ?Limit or avoid alcohol, caffeinated beverages, and cigarettes, especially close to bedtime. These can disrupt your sleep. ?Do not eat a large meal or eat spicy foods right before bedtime. This can lead to digestive discomfort that can make it hard for you to sleep. ?Sleep habits ? ?Keep a sleep diary to help you and your health care provider figure out what could be causing your insomnia. Write down: ?When you sleep. ?When you wake up during the night. ?How well you sleep. ?How rested you feel the next day. ?Any side effects of medicines you are taking. ?What you eat and drink. ?Make your bedroom a dark, comfortable place where it is easy to fall asleep. ?Put up shades or blackout curtains to block light from outside. ?Use a white noise machine to block noise. ?Keep the temperature cool. ?Limit screen use before bedtime. This includes: ?Watching TV. ?Using your smartphone, tablet, or computer. ?  Stick to a routine that includes  going to bed and waking up at the same times every day and night. This can help you fall asleep faster. Consider making a quiet activity, such as reading, part of your nighttime routine. ?Try to avoid taking naps during the day so that you sleep better at night. ?Get out of bed if you are still awake after 15 minutes of trying to sleep. Keep the lights down, but try reading or doing a quiet activity. When you feel sleepy, go back to bed. ?General instructions ?Take over-the-counter and prescription medicines only as told by your health care provider. ?Exercise regularly, as told by your health care provider. Avoid exercise starting several hours before bedtime. ?Use relaxation techniques to manage stress. Ask your health care provider to suggest some techniques that may work well for you. These may include: ?Breathing exercises. ?Routines to release muscle tension. ?Visualizing peaceful scenes. ?Make sure that you drive carefully. Avoid driving if you feel very sleepy. ?Keep all follow-up visits as told by your health care provider. This is important. ?Contact a health care provider if: ?You are tired throughout the day. ?You have trouble in your daily routine due to sleepiness. ?You continue to have sleep problems, or your sleep problems get worse. ?Get help right away if: ?You have serious thoughts about hurting yourself or someone else. ?If you ever feel like you may hurt yourself or others, or have thoughts about taking your own life, get help right away. You can go to your nearest emergency department or call: ?Your local emergency services (911 in the [place] ?A suicide crisis helpline, such as the [place] at 838-583-4919 or 988 in the [place]. This is open 24 hours a day. ?Summary ?Insomnia is a sleep disorder that makes it difficult to fall asleep or stay asleep. ?Insomnia can be long-term (chronic) or short-term (acute). ?Treatment for insomnia depends on the cause. Treatment may  focus on treating an underlying condition that is causing insomnia. ?Keep a sleep diary to help you and your health care provider figure out what could be causing your insomnia. ?This information is not intended to replace advice given to you by your health care provider. Make sure you discuss any questions you have with your health care provider. ?Document Revised: 10/20/2020 Document Reviewed: 02/05/2020 ?Elsevier Patient Education ? 2022 [place]. ? ? ? ?Managing Depression, Adult ?Depression is a mental health condition that affects your thoughts, feelings, and actions. Being diagnosed with depression can bring you relief if you did not know why you have felt or behaved a certain way. It could also leave you feeling overwhelmed with uncertainty about your future. Preparing yourself to manage your symptoms can help you feel more positive about your future. ?How to manage lifestyle changes ?Managing stress ?Stress is your body's reaction to life changes and events, both good and bad. Stress can add to your feelings of depression. Learning to manage your stress can help lessen your feelings of depression. ?Try some of the following approaches to reducing your stress (stress reduction techniques): ?Listen to music that you enjoy and that inspires you. ?Try using a meditation app or take a meditation class. ?Develop a practice that helps you connect with your spiritual self. Walk in nature, pray, or go to a place of worship. ?Do some deep breathing. To do this, inhale slowly through your nose. Pause at the top of your inhale for a few seconds and then exhale slowly, letting your muscles relax. ?  Practice yoga to help relax and work your muscles. ?Choose a stress reduction technique that suits your lifestyle and personality. These techniques take time and practice to develop. Set aside 5-15 minutes a day to do them. Therapists can offer training in these techniques. Other things you can do to manage stress  include: ?Keeping a stress diary. ?Knowing your limits and saying no when you think something is too much. ?Paying attention to how you react to certain situations. You may not be able to control everything, bu

## 2021-06-29 NOTE — Progress Notes (Signed)
Subjective:  Patient ID: Christine Valdez, female    DOB: [birth year]  Age: 71 y.o. MRN: [id number]  CC:  Chief Complaint  Patient presents with   Hirsutism    X1 year   Depression    X6 months worse past 2-3 months    HPI [name] presents for   Hirsutism: Chin, facial hair has increased past few years, more past 6 months.  Picking at skin or hair all the time. Anxious at times. Trouble sleeping as picking at face.  Has not discussed hair growth with gyn.   Depression: History of major depressive disorder, treated with Celexa 40 mg daily. Same dose for past 20 years. Initial sx's prior to menopause. Started by Gyn Dr. Hyacinth Meeker.  Had been stable, then past 6 months more depressed symptoms, decreased interest in activities, increased sleep, in a funk and trouble getting out of it.  Tried increasing exercise to no avail. Eating well. Fatigue al the time.  Grandaughter more vocal about divorce of parents (pt's daughter), this has been more stressful, and some stress with getting older.  Depression runs in family. No SI/HI.  Rare glass of wine, no recent increase.  No marijuana/cbd/IDU.  Physical planned Monday.  Prior therapist - during divorce of dtr and son in law in 2018.        06/29/2021    4:45 PM 05/18/2021    3:21 PM 04/20/2021    4:03 PM 12/23/2020   11:30 AM 09/30/2020   12:06 PM  Depression screen PHQ 2/9  Decreased Interest 3 0 0 0 0  Down, Depressed, Hopeless 3 0 0 0 0  PHQ - 2 Score 6 0 0 0 0  Altered sleeping 3 0  0   Tired, decreased energy 3 0  0   Change in appetite 3 0  0   Feeling bad or failure about yourself  0 0  0   Trouble concentrating 0 0  0   Moving slowly or fidgety/restless 0 0  0   Suicidal thoughts 0 0  0   PHQ-9 Score 15 0  0   Difficult doing work/chores Extremely dIfficult Not difficult at all        History Patient Active Problem List   Diagnosis Date Noted   Influenza with respiratory manifestation other than  pneumonia 05/05/2018   Recurrent major depressive disorder, in partial remission (HCC) 09/28/2016   Hypothyroidism 09/28/2016   B12 deficiency 09/28/2016   Hyperglycemia 09/28/2016   Pruritus ani 06/25/2015   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   Anxiety 02/04/2015   Major depression, recurrent (HCC) 02/04/2015   Mild intermittent asthma without complication 04/21/2014   Osteoarthrosis, unspecified whether generalized or localized, shoulder region 07/04/2013   Past Medical History:  Diagnosis Date   Allergy    uses Flonase daily as needed and takes Loratadine daily    Anemia    only after birth of 2nd child   Arthritis    Asthma    uses Albuterol daily as needed;exercise induced   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, right cheek   Depression    takes Celexa daily   Depression    Phreesia 07/01/2020   GERD (gastroesophageal reflux disease)    occ   History of colon polyps    History of shingles    5-6 yrs   Hyperlipidemia    Hypothyroid    Insomnia    takes Restoril nightly as needed   Interstitial cystitis  Osteoarthritis    Sjogren's syndrome (HCC)    Spinal headache    after 1st C/S, o2sat low after shoulder surgery after went to room(51%),hard to urinate)   Thyroid disease    Phreesia 09/21/2019   Past Surgical History:  Procedure Laterality Date   CARPAL TUNNEL RELEASE Right 05/2007   CESAREAN SECTION  1981/1984   x 2   COLONOSCOPY     JOINT REPLACEMENT N/A    Phreesia 09/21/2019   TONSILLECTOMY AND ADENOIDECTOMY  age 69   TOTAL SHOULDER ARTHROPLASTY Right 07/04/2013   DR NORRIS   TOTAL SHOULDER ARTHROPLASTY Right 07/04/2013   Procedure: RIGHT TOTAL SHOULDER ARTHROPLASTY;  Surgeon: Verlee Rossetti, MD;  Location: [facility];  Service: Orthopedics;  Laterality: Right;   TOTAL SHOULDER ARTHROPLASTY Left 03/12/2015   Procedure: LEFT SHOULDER TOTAL SHOULDER ARTHROPLASTY;  Surgeon: Beverely Low, MD;  Location: [facility] OR;  Service: Orthopedics;  Laterality: Left;    Allergies  Allergen Reactions   Diflucan [Fluconazole] Hives    unknown   Thimerosal Hives   Mercury Detox [Nutritional Supplements] Hives   Influenza Vaccine Recombinant Rash    Mercury component and Thierosal in flu vaccine   Levaquin [Levofloxacin] Rash   Sulfa Antibiotics Rash   Prior to Admission medications   Medication Sig Start Date End Date Taking? Authorizing Provider  albuterol (PROAIR HFA) 108 (90 Base) MCG/ACT inhaler INHALE 2 PUFFS INTO THE LUNGS EVERY 6 (SIX) HOURS AS NEEDED FOR SHORTNESS OF BREATH. 09/24/19  Yes Shade Flood, MD  cholecalciferol (VITAMIN D) 1000 UNITS tablet Take 1,000 Units by mouth daily.   Yes [provider]  citalopram (CELEXA) 40 MG tablet Take 1 tablet (40 mg total) by mouth daily. 03/29/21  Yes Jerene Bears, MD  docusate sodium (COLACE) 100 MG capsule Take 400 mg by mouth at bedtime.    Yes [provider]  fluticasone (FLONASE) 50 MCG/ACT nasal spray USE 1-2 SPRAYS INTO EACH NOSTRIL ONCE A DAY AS NEEDED 06/05/19  Yes Collie Siad A, MD  levothyroxine (SYNTHROID) 75 MCG tablet Take 1 tablet (75 mcg total) by mouth daily. 07/02/20  Yes Shade Flood, MD  loratadine (CLARITIN) 10 MG tablet Take 10 mg by mouth daily as needed for allergies.    Yes [provider]  meloxicam (MOBIC) 15 MG tablet TAKE 1 TABLET (15 MG TOTAL) BY MOUTH DAILY. 05/31/21  Yes Louann Sjogren, MD  temazepam (RESTORIL) 15 MG capsule TAKE 1 CAPSULE BY MOUTH EVERY DAY AT BEDTIME AS NEEDED 12/24/20  Yes Janeece Agee, NP  vitamin B-12 (CYANOCOBALAMIN) 1000 MCG tablet Take 1,000 mcg by mouth every other day.    Yes [provider]   Social History   Socioeconomic History   Marital status: Married    Spouse name: Not on file   Number of children: 2   Years of education: Not on file   Highest education level: Not on file  Occupational History   Not on file  Tobacco Use   Smoking status: Never   Smokeless tobacco: Never   Vaping Use   Vaping Use: Never used  Substance and Sexual Activity   Alcohol use: Yes    Alcohol/week: 2.0 standard drinks    Types: 2 Glasses of wine per week   Drug use: No   Sexual activity: Not Currently    Partners: Male    Birth control/protection: Post-menopausal  Other Topics Concern   Not on file  Social History Narrative   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   Objective:   Vitals:   06/29/21 1610  BP: 122/82  Pulse: 83  Temp: 98 F (36.7 C)  TempSrc: Temporal  SpO2: 98%  Weight: 156 lb 6.4 oz (70.9 kg)  Height: 5' 3.5" (1.613 m)     Physical Exam Constitutional:      General: She is not in acute distress.    Appearance: Normal appearance. She is well-developed.  HENT:     Head: Normocephalic and atraumatic.  Cardiovascular:     Rate and Rhythm: Normal rate.  Pulmonary:     Effort: Pulmonary effort is normal.  Skin:    Comments: Few small excoriated areas on lower face.  No open wounds.  Neurological:     Mental Status: She is alert and oriented to person, place, and time.  Psychiatric:        Mood and Affect: Mood normal.       Assessment & Plan:  Reemas Sundby is a 71 y.o. female . Depression with anxiety  Hirsutism  Other fatigue  Insomnia, unspecified type - Plan: hydrOXYzine (ATARAX) 10 MG tablet  Longstanding depression, had been well controlled until last 6 months.  Possible situational stressors, family stressors as contributor.  Suspect component of anxiety with facial picking and  insomnia.  Plan for physical this Monday, will check TSH, other labs with persistent fatigue.  Counseling discussed, declined at this time.  Hydroxyzine if needed for anxiety, or sleep with potential side effects discussed.  Continue same dose Celexa for now.  Handout given on management of depression.     No orders of the defined types were placed in this encounter.  Patient Instructions  We can check thyroid and other labs with physical this Monday. No change in celexa for now until labs back, then can discuss next step.  I would consider meeting with therapist, here are a few numbers.  Discuss the increased hair growth with gynecology if possible, but will check thyroid test next week.  Hydroxyzine at bedtime for now.   Here are a few options for counseling:  [facility]:  [phone]  [facility] [phone]   Managing Depression, Adult Depression is a mental health condition that affects your thoughts, feelings, and actions. Being diagnosed with depression can bring you relief if you did not know why you have felt or behaved a certain way. It could also leave you feeling overwhelmed with uncertainty about your future. Preparing yourself to manage your symptoms can help you feel more positive about your future. How to manage lifestyle changes Managing stress Stress is your body's reaction to life changes and events, both good and bad. Stress can add to your feelings of depression. Learning to manage your stress can help lessen your feelings of depression. Try some of the following approaches to reducing your stress (stress reduction techniques): Listen to music that you enjoy and that inspires you. Try using a meditation app or take a meditation class. Develop a  practice that helps you connect with your spiritual self. Walk in nature, pray, or go to a place of worship. Do some deep breathing. To do this, inhale slowly through your nose.  Pause at the top of your inhale for a few seconds and then exhale slowly, letting your muscles relax. Practice yoga to help relax and work your muscles. Choose a stress reduction technique that suits your lifestyle and personality. These techniques take time and practice to develop. Set aside 5-15 minutes a day to do them. Therapists can offer training in these techniques. Other things you can do to manage stress include: Keeping a stress diary. Knowing your limits and saying no when you think something is too much. Paying attention to how you react to certain situations. You may not be able to control everything, but you can change your reaction. Adding humor to your life by watching funny films or TV shows. Making time for activities that you enjoy and that relax you.  Medicines Medicines, such as antidepressants, are often a part of treatment for depression. Talk with your pharmacist or health care provider about all the medicines, supplements, and herbal products that you take, their possible side effects, and what medicines and other products are safe to take together. Make sure to report any side effects you may have to your health care provider. Relationships Your health care provider may suggest family therapy, couples therapy, or individual therapy as part of your treatment. How to recognize changes Everyone responds differently to treatment for depression. As you recover from depression, you may start to: Have more interest in doing activities. Feel less hopeless. Have more energy. Overeat less often, or have a better appetite. Have better mental focus. It is important to recognize if your depression is not getting better or is getting worse. The symptoms you had in the beginning may return, such as: Tiredness (fatigue) or low energy. Eating too much or too little. Sleeping too much or too little. Feeling restless, agitated, or hopeless. Trouble focusing or making  decisions. Unexplained physical complaints. Feeling irritable, angry, or aggressive. If you or your family members notice these symptoms coming back, let your health care provider know right away. Follow these instructions at home: Activity  Try to get some form of exercise each day, such as walking, biking, swimming, or lifting weights. Practice stress reduction techniques. Engage your mind by taking a class or doing some volunteer work. Lifestyle Get the right amount and quality of sleep. Cut down on using caffeine, tobacco, alcohol, and other potentially harmful substances. Eat a healthy diet that includes plenty of vegetables, fruits, whole grains, low-fat dairy products, and lean protein. Do not eat a lot of foods that are high in solid fats, added sugars, or salt (sodium). General instructions Take over-the-counter and prescription medicines only as told by your health care provider. Keep all follow-up visits as told by your health care provider. This is important. Where to find support Talking to others Friends and family members can be sources of support and guidance. Talk to trusted friends or family members about your condition. Explain your symptoms to them, and let them know that you are working with a health care provider to treat your depression. Tell friends and family members how they also can be helpful. Finances Find appropriate mental health providers that fit with your financial situation. Talk with your health care provider about options to get reduced prices on your medicines. Where to find more information You can find support in  your area from: Anxiety and Depression Association of America (ADAA): www.adaa.org Mental Health America: www.mentalhealthamerica.net [employer] on Mental Illness: www.nami.org Contact a health care provider if: You stop taking your antidepressant medicines, and you have any of these  symptoms: Nausea. Headache. Light-headedness. Chills and body aches. Not being able to sleep (insomnia). You or your friends and family think your depression is getting worse. Get help right away if: You have thoughts of hurting yourself or others. If you ever feel like you may hurt yourself or others, or have thoughts about taking your own life, get help right away. Go to your nearest emergency department or: Call your local emergency services (911 in the [place] Call a suicide crisis helpline, such as the National Suicide Prevention Lifeline at (725)132-3460 or 988 in the [place] This is open 24 hours a day in the [place] Text the Crisis Text Line at 936-439-1001 (in the [place] Summary If you are diagnosed with depression, preparing yourself to manage your symptoms is a good way to feel positive about your future. Work with your health care provider on a management plan that includes stress reduction techniques, medicines (if applicable), therapy, and healthy lifestyle habits. Keep talking with your health care provider about how your treatment is working. If you have thoughts about taking your own life, call a suicide crisis helpline or text a crisis text line. This information is not intended to replace advice given to you by your health care provider. Make sure you discuss any questions you have with your health care provider. Document Revised: 10/20/2020 Document Reviewed: 02/05/2019 Elsevier Patient Education  2022 [employer] Inc.       Signed,   Meredith Staggers, MD [facility], [facility] 06/29/21 4:58 PM

## 2021-07-04 ENCOUNTER — Other Ambulatory Visit: Payer: Self-pay

## 2021-07-04 ENCOUNTER — Encounter: Payer: Self-pay | Admitting: Family Medicine

## 2021-07-04 ENCOUNTER — Ambulatory Visit (INDEPENDENT_AMBULATORY_CARE_PROVIDER_SITE_OTHER): Payer: Medicare HMO | Admitting: Family Medicine

## 2021-07-04 VITALS — BP 132/70 | HR 79 | Temp 98.2°F | Resp 15 | Ht 63.5 in | Wt 154.2 lb

## 2021-07-04 DIAGNOSIS — E039 Hypothyroidism, unspecified: Secondary | ICD-10-CM | POA: Diagnosis not present

## 2021-07-04 DIAGNOSIS — Z23 Encounter for immunization: Secondary | ICD-10-CM

## 2021-07-04 DIAGNOSIS — R5383 Other fatigue: Secondary | ICD-10-CM

## 2021-07-04 DIAGNOSIS — R7303 Prediabetes: Secondary | ICD-10-CM

## 2021-07-04 DIAGNOSIS — E785 Hyperlipidemia, unspecified: Secondary | ICD-10-CM

## 2021-07-04 DIAGNOSIS — Z0000 Encounter for general adult medical examination without abnormal findings: Secondary | ICD-10-CM

## 2021-07-04 DIAGNOSIS — F418 Other specified anxiety disorders: Secondary | ICD-10-CM

## 2021-07-04 DIAGNOSIS — Z130 Encounter for screening for diseases of the blood and blood-forming organs and certain disorders involving the immune mechanism: Secondary | ICD-10-CM

## 2021-07-04 DIAGNOSIS — R69 Illness, unspecified: Secondary | ICD-10-CM | POA: Diagnosis not present

## 2021-07-04 DIAGNOSIS — G4700 Insomnia, unspecified: Secondary | ICD-10-CM | POA: Diagnosis not present

## 2021-07-04 MED ORDER — LEVOTHYROXINE SODIUM 75 MCG PO TABS: 75.0000 ug | ORAL_TABLET | Freq: Every day | ORAL | 3 refills | Status: DC

## 2021-07-04 MED ORDER — HYDROXYZINE HCL 10 MG PO TABS: 10.0000 mg | ORAL_TABLET | Freq: Three times a day (TID) | ORAL | 0 refills | Status: DC | PRN

## 2021-07-04 NOTE — Progress Notes (Signed)
? ?Subjective:  ?Patient ID: Christine Valdez, female    DOB: Jul 30, 1950  Age: 71 y.o. MRN: [id number] ? ?CC:  ?Chief Complaint  ?Patient presents with  ? Medicare Wellness  ?  Pt here for wellness no concerns, pt has been fasting today for labs, requesting [facility] care records for last exam, note also willing to have pneumonia vaccine booster today if recommended   ? ? ?HPI ?Christine Valdez presents for  ? ?Presents for annual wellness exam.  ? ?Care team: ?PCP: me ?Podiatry Dr. Amalia Hailey -metatarsal capsulitis.  ?Vascular Dr. Donzetta Matters - Raynaud's with history of Sjogren's.  No need for vascular intervention.  As needed follow-up. ?GYN Dr. Sabra Heck ?Optho Dr. Katy Fitch.  ? ? ?Depression/ anxiety ?Discussed at 06/29/2021 visit, longstanding depression with worsening symptoms past 6 months, possible situational stressors.  Suspect a component of anxiety as well with facial picking and insomnia.  Hydroxyzine temporarily prescribed if needed.  Continued on same dose citalopram.  Handout given on management of depression.  Counseling was declined.  ?No change in sleep with hydroxyzine - took '10mg'$  - no change, no side effects. Not using temazepam has numbers for counseling - considering.   ? ?Fatigue, hx of hypothyroidism.  ?Discussed last visit. Thought to be component of depression/anxiety but had been fatigued for some time.  No change with exercise.  Plan for labs today. ?On synthroid 13mg qd. No missed doses.  ? ?Lab Results  ?Component Value Date  ? TSH 1.79 12/23/2020  ? ?Allergies well controlled with flonase, claritin. Rare need albuterol.  ? ?Prediabetes: ?No regular soda/sweet tea.  ?Planned restart of exercise. Foot pain better.  ?Lab Results  ?Component Value Date  ? HGBA1C 6.0 12/23/2020  ? ?Wt Readings from Last 3 Encounters:  ?07/04/21 154 lb 3.2 oz (69.9 kg)  ?06/29/21 156 lb 6.4 oz (70.9 kg)  ?05/18/21 153 lb 3.2 oz (69.5 kg)  ? ?Fall screening ? ?  07/04/2021  ?  2:28 PM 05/18/2021  ?  3:21 PM 04/20/2021   ?  4:03 PM 12/23/2020  ? 11:29 AM 09/30/2020  ? 12:06 PM  ?Fall Risk   ?Falls in the past year? 0 0 0 0 0  ?Number falls in past yr: 0 0 0 0 0  ?Injury with Fall? 0 0 0 0 0  ?Risk for fall due to : No Fall Risks No Fall Risks No Fall Risks    ?Follow up Falls evaluation completed Falls evaluation completed Falls evaluation completed  Falls evaluation completed  ? ?Lighting in home:adequate.  ?Loose rugs/carpets/pets: 3 pets - dogs.  ?Stairs:none.  ?Grab bars in bathroom:single bar.  ?Timed up and go:7 seconds, normal gait, no instability.  ? ?Depression Screening: ? ?  07/04/2021  ?  2:28 PM 06/29/2021  ?  4:45 PM 05/18/2021  ?  3:21 PM 04/20/2021  ?  4:03 PM 12/23/2020  ? 11:30 AM  ?Depression screen PHQ 2/9  ?Decreased Interest 3 3 0 0 0  ?Down, Depressed, Hopeless 2 3 0 0 0  ?PHQ - 2 Score 5 6 0 0 0  ?Altered sleeping 3 3 0  0  ?Tired, decreased energy 3 3 0  0  ?Change in appetite 2 3 0  0  ?Feeling bad or failure about yourself  0 0 0  0  ?Trouble concentrating 0 0 0  0  ?Moving slowly or fidgety/restless 0 0 0  0  ?Suicidal thoughts 0 0 0  0  ?PHQ-9 Score 13 15  0  0  ?Difficult doing work/chores  Extremely dIfficult Not difficult at all    ? ? ?Cancer Screening: ?Colon: 08/2014 - repeat 10 yrs ?Mammogram- 02/2021.  ?Pap at GYN normal. Up to date.  ? ?Immunization History  ?Administered Date(s) Administered  ? Fluad Quad(high Dose 65+) 01/03/2019  ? PFIZER(Purple Top)SARS-COV-2 Vaccination 04/29/2019, 05/18/2019, 12/16/2019  ? Pneumococcal Conjugate-13 02/02/2018  ? Pneumococcal Polysaccharide-23 04/21/2014, 07/04/2021  ? Td 11/24/2019  ? Tdap 06/24/2013  ? Zoster Recombinat (Shingrix) 07/23/2017, 09/21/2017  ? ? ?Functional Status Survey: ?Is the patient deaf or have difficulty hearing?: No ?Does the patient have difficulty seeing, even when wearing glasses/contacts?: No ?Does the patient have difficulty concentrating, remembering, or making decisions?: No ?Does the patient have difficulty walking or climbing stairs?:  No ?Does the patient have difficulty dressing or bathing?: No ?Does the patient have difficulty doing errands alone such as visiting a doctor's office or shopping?: No ? ?Memory Screen: ? ?  07/04/2021  ?  2:29 PM 07/02/2020  ?  2:38 PM  ?6CIT Screen  ?What Year? 0 points 0 points  ?What month? 0 points 0 points  ?What time? 0 points 0 points  ?Count back from 20 0 points 0 points  ?Months in reverse 0 points 0 points  ?Repeat phrase 0 points 0 points  ?Total Score 0 points 0 points  ? ? ?Alcohol Screening: ?[facility] Visit from 07/04/2021 in [place]  ?AUDIT-C Score 0  ? ?  ? ? ?Tobacco:none.  ? ? ?No results found. ?Optho/optometry: Dr. Katy Fitch - yearly.  ? ?Dental: every year.  ? ?Exercise: ?Planning to increase.  ? ?Advanced Directives:  ?Has HCPOA, living will. No changes.  ? ? ?History ?Patient Active Problem List  ? Diagnosis Date Noted  ? Influenza with respiratory manifestation other than pneumonia 05/05/2018  ? Recurrent major depressive disorder, in partial remission ([place]) 09/28/2016  ? Hypothyroidism 09/28/2016  ? B12 deficiency 09/28/2016  ? Hyperglycemia 09/28/2016  ? Pruritus ani 06/25/2015  ? 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  ? Anxiety 02/04/2015  ? Major depression, recurrent ([place]) 02/04/2015  ? Mild intermittent asthma without complication 38/75/6433  ? Osteoarthrosis, unspecified whether generalized or localized, shoulder region 07/04/2013  ? ?Past Medical History:  ?Diagnosis Date  ? Allergy   ? uses Flonase daily as needed and takes Loratadine daily   ? Anemia   ? only after birth of 2nd child  ? Arthritis   ? Asthma   ? uses Albuterol daily as needed;exercise induced  ? 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 Depression   ? takes Celexa daily  ? Depression   ? Phreesia 07/01/2020  ? GERD (gastroesophageal reflux disease)   ? occ  ? History of colon polyps   ? History of shingles   ? 5-6 yrs  ? Hyperlipidemia   ? Hypothyroid   ?  Insomnia   ? takes Restoril nightly as needed  ? Interstitial cystitis   ? Osteoarthritis   ? Sjogren's syndrome ([place])   ? Spinal headache   ? after 1st C/S, o2sat low after shoulder surgery after went to room(51%),hard to urinate)  ? Thyroid disease   ? Phreesia 09/21/2019  ? ?Past Surgical History:  ?Procedure Laterality Date  ? CARPAL TUNNEL RELEASE Right 05/2007  ? CESAREAN SECTION  1981/1984  ? x 2  ? COLONOSCOPY    ? JOINT REPLACEMENT N/A   ? Phreesia 09/21/2019  ? TONSILLECTOMY AND ADENOIDECTOMY  age 19  ? TOTAL SHOULDER ARTHROPLASTY Right 07/04/2013  ? DR Veverly Fells  ? TOTAL SHOULDER ARTHROPLASTY Right 07/04/2013  ? Procedure: RIGHT TOTAL SHOULDER ARTHROPLASTY;  Surgeon: Augustin Schooling, MD;  Location: [place];  Service: Orthopedics;  Laterality: Right;  ? TOTAL SHOULDER ARTHROPLASTY Left 03/12/2015  ? Procedure: LEFT SHOULDER TOTAL SHOULDER ARTHROPLASTY;  Surgeon: Netta Cedars, MD;  Location: [facility];  Service: Orthopedics;  Laterality: Left;  ? ?Allergies  ?Allergen Reactions  ? Diflucan [Fluconazole] Hives  ?  unknown  ? Thimerosal Hives  ? Mercury Detox [Nutritional Supplements] Hives  ? Influenza Vaccine Recombinant Rash  ?  Mercury component and Thierosal in flu vaccine  ? Levaquin [Levofloxacin] Rash  ? Sulfa Antibiotics Rash  ? ?Prior to Admission medications   ?Medication Sig Start Date End Date Taking? Authorizing Provider  ?albuterol (PROAIR HFA) 108 (90 Base) MCG/ACT inhaler INHALE 2 PUFFS INTO THE LUNGS EVERY 6 (SIX) HOURS AS NEEDED FOR SHORTNESS OF BREATH. 09/24/19  Yes Wendie Agreste, MD  ?cholecalciferol (VITAMIN D) 1000 UNITS tablet Take 1,000 Units by mouth daily.   Yes [provider]  ?citalopram (CELEXA) 40 MG tablet Take 1 tablet (40 mg total) by mouth daily. 03/29/21  Yes Megan Salon, MD  ?docusate sodium (COLACE) 100 MG capsule Take 400 mg by mouth at bedtime.    Yes [provider]  ?fluticasone (FLONASE) 50 MCG/ACT nasal spray USE 1-2 SPRAYS INTO EACH NOSTRIL ONCE A DAY  AS NEEDED 06/05/19  Yes Delia Chimes A, MD  ?hydrOXYzine (ATARAX) 10 MG tablet Take 1 tablet (10 mg total) by mouth 3 (three) times daily as needed for anxiety (or sleep). Start at bedtime. 06/29/21  Yes G

## 2021-07-04 NOTE — Patient Instructions (Addendum)
You can try up to '20mg'$  (2 of the '10mg'$ ) hydroxyzine for sleep. Follow up if not improving sleep. I would still consider meeting with therapist.  ? ?Keep me posted on the fatigue and mood symptoms.  73-monthfollow-up for med review but I am happy to see you sooner if needed if we need to make some changes.  Take care. ? ?Health Maintenance After Age 69?After age 69 you are at a higher risk for certain long-term diseases and infections as well as injuries from falls. Falls are a major cause of broken bones and head injuries in people who are older than age 69 Getting regular preventive care can help to keep you healthy and well. Preventive care includes getting regular testing and making lifestyle changes as recommended by your health care provider. Talk with your health care provider about: ?Which screenings and tests you should have. A screening is a test that checks for a disease when you have no symptoms. ?A diet and exercise plan that is right for you. ?What should I know about screenings and tests to prevent falls? ?Screening and testing are the best ways to find a health problem early. Early diagnosis and treatment give you the best chance of managing medical conditions that are common after age 312 Certain conditions and lifestyle choices may make you more likely to have a fall. Your health care provider may recommend: ?Regular vision checks. Poor vision and conditions such as cataracts can make you more likely to have a fall. If you wear glasses, make sure to get your prescription updated if your vision changes. ?Medicine review. Work with your health care provider to regularly review all of the medicines you are taking, including over-the-counter medicines. Ask your health care provider about any side effects that may make you more likely to have a fall. Tell your health care provider if any medicines that you take make you feel dizzy or sleepy. ?Strength and balance checks. Your health care provider may  recommend certain tests to check your strength and balance while standing, walking, or changing positions. ?Foot health exam. Foot pain and numbness, as well as not wearing proper footwear, can make you more likely to have a fall. ?Screenings, including: ?Osteoporosis screening. Osteoporosis is a condition that causes the bones to get weaker and break more easily. ?Blood pressure screening. Blood pressure changes and medicines to control blood pressure can make you feel dizzy. ?Depression screening. You may be more likely to have a fall if you have a fear of falling, feel depressed, or feel unable to do activities that you used to do. ?Alcohol use screening. Using too much alcohol can affect your balance and may make you more likely to have a fall. ?Follow these instructions at home: ?Lifestyle ?Do not drink alcohol if: ?Your health care provider tells you not to drink. ?If you drink alcohol: ?Limit how much you have to: ?0-1 drink a day for women. ?0-2 drinks a day for men. ?Know how much alcohol is in your drink. In the [place] one drink equals one 12 oz bottle of beer (355 mL), one 5 oz glass of wine (148 mL), or one 1? oz glass of hard liquor (44 mL). ?Do not use any products that contain nicotine or tobacco. These products include cigarettes, chewing tobacco, and vaping devices, such as e-cigarettes. If you need help quitting, ask your health care provider. ?Activity ? ?Follow a regular exercise program to stay fit. This will help you maintain your balance. Ask your  health care provider what types of exercise are appropriate for you. ?If you need a cane or walker, use it as recommended by your health care provider. ?Wear supportive shoes that have nonskid soles. ?Safety ? ?Remove any tripping hazards, such as rugs, cords, and clutter. ?Install safety equipment such as grab bars in bathrooms and safety rails on stairs. ?Keep rooms and walkways well-lit. ?General instructions ?Talk with your health care provider  about your risks for falling. Tell your health care provider if: ?You fall. Be sure to tell your health care provider about all falls, even ones that seem minor. ?You feel dizzy, tiredness (fatigue), or off-balance. ?Take over-the-counter and prescription medicines only as told by your health care provider. These include supplements. ?Eat a healthy diet and maintain a healthy weight. A healthy diet includes low-fat dairy products, low-fat (lean) meats, and fiber from whole grains, beans, and lots of fruits and vegetables. ?Stay current with your vaccines. ?Schedule regular health, dental, and eye exams. ?Summary ?Having a healthy lifestyle and getting preventive care can help to protect your health and wellness after age 60. ?Screening and testing are the best way to find a health problem early and help you avoid having a fall. Early diagnosis and treatment give you the best chance for managing medical conditions that are more common for people who are older than age 21. ?Falls are a major cause of broken bones and head injuries in people who are older than age 68. Take precautions to prevent a fall at home. ?Work with your health care provider to learn what changes you can make to improve your health and wellness and to prevent falls. ?This information is not intended to replace advice given to you by your health care provider. Make sure you discuss any questions you have with your health care provider. ?Document Revised: 08/16/2020 Document Reviewed: 08/16/2020 ?Elsevier Patient Education ? [place]. ? ?

## 2021-07-05 ENCOUNTER — Encounter: Payer: Self-pay | Admitting: Family Medicine

## 2021-07-05 LAB — CBC WITH DIFFERENTIAL/PLATELET
Basophils Absolute: 0 10*3/uL (ref 0.0–0.1)
Basophils Relative: 0.4 % (ref 0.0–3.0)
Eosinophils Absolute: 0.1 10*3/uL (ref 0.0–0.7)
Eosinophils Relative: 2.5 % (ref 0.0–5.0)
HCT: 41.1 % (ref 36.0–46.0)
Hemoglobin: 13.7 g/dL (ref 12.0–15.0)
Lymphocytes Relative: 20.3 % (ref 12.0–46.0)
Lymphs Abs: 1 10*3/uL (ref 0.7–4.0)
MCHC: 33.3 g/dL (ref 30.0–36.0)
MCV: 89.8 fl (ref 78.0–100.0)
Monocytes Absolute: 0.4 10*3/uL (ref 0.1–1.0)
Monocytes Relative: 7.7 % (ref 3.0–12.0)
Neutro Abs: 3.4 10*3/uL (ref 1.4–7.7)
Neutrophils Relative %: 69.1 % (ref 43.0–77.0)
Platelets: 167 10*3/uL (ref 150.0–400.0)
RBC: 4.57 Mil/uL (ref 3.87–5.11)
RDW: 13 % (ref 11.5–15.5)
WBC: 5 10*3/uL (ref 4.0–10.5)

## 2021-07-05 LAB — COMPREHENSIVE METABOLIC PANEL
ALT: 14 U/L (ref 0–35)
AST: 22 U/L (ref 0–37)
Albumin: 4.5 g/dL (ref 3.5–5.2)
Alkaline Phosphatase: 40 U/L (ref 39–117)
BUN: 12 mg/dL (ref 6–23)
CO2: 27 mEq/L (ref 19–32)
Calcium: 9.9 mg/dL (ref 8.4–10.5)
Chloride: 102 mEq/L (ref 96–112)
Creatinine, Ser: 0.97 mg/dL (ref 0.40–1.20)
GFR: 59.96 mL/min — ABNORMAL LOW (ref >60.00)
Glucose, Bld: 87 mg/dL (ref 70–99)
Potassium: 4.1 mEq/L (ref 3.5–5.1)
Sodium: 137 mEq/L (ref 135–145)
Total Bilirubin: 0.6 mg/dL (ref 0.2–1.2)
Total Protein: 6.7 g/dL (ref 6.0–8.3)

## 2021-07-05 LAB — LIPID PANEL
Cholesterol: 249 mg/dL — ABNORMAL HIGH (ref 0–200)
HDL: 61.8 mg/dL (ref >39.00)
LDL Cholesterol: 157 mg/dL — ABNORMAL HIGH (ref 0–99)
NonHDL: 187.28
Total CHOL/HDL Ratio: 4
Triglycerides: 150 mg/dL — ABNORMAL HIGH (ref 0.0–149.0)
VLDL: 30 mg/dL (ref 0.0–40.0)

## 2021-07-05 LAB — HEMOGLOBIN A1C: Hgb A1c MFr Bld: 6.2 % (ref 4.6–6.5)

## 2021-07-05 LAB — TSH: TSH: 0.53 u[IU]/mL (ref 0.35–5.50)

## 2021-08-15 NOTE — Telephone Encounter (Signed)
NA

## 2021-08-29 ENCOUNTER — Telehealth (HOSPITAL_BASED_OUTPATIENT_CLINIC_OR_DEPARTMENT_OTHER): Payer: Self-pay | Admitting: Obstetrics & Gynecology

## 2021-08-29 NOTE — Telephone Encounter (Signed)
Patient called and  state that Dr.Miller gave her a psychiatry name a year ago for her to go to .She would like for someone to please call her with that information.

## 2021-08-31 ENCOUNTER — Encounter (HOSPITAL_BASED_OUTPATIENT_CLINIC_OR_DEPARTMENT_OTHER): Payer: Self-pay | Admitting: Obstetrics & Gynecology

## 2021-10-19 ENCOUNTER — Other Ambulatory Visit: Payer: Self-pay | Admitting: Family Medicine

## 2021-10-19 DIAGNOSIS — G4700 Insomnia, unspecified: Secondary | ICD-10-CM

## 2021-10-20 NOTE — Telephone Encounter (Signed)
Controlled substance database reviewed, last filled temazepam 15 mg #30 on 05/18/2021.  Depression/anxiety discussed at that time.  Refill ordered.

## 2021-11-02 ENCOUNTER — Telehealth: Payer: Medicare HMO | Admitting: Family Medicine

## 2021-11-08 DIAGNOSIS — M25551 Pain in right hip: Secondary | ICD-10-CM | POA: Diagnosis not present

## 2021-11-08 DIAGNOSIS — M545 Low back pain, unspecified: Secondary | ICD-10-CM | POA: Diagnosis not present

## 2021-11-09 ENCOUNTER — Telehealth: Payer: Medicare HMO | Admitting: Family Medicine

## 2021-11-09 DIAGNOSIS — M25551 Pain in right hip: Secondary | ICD-10-CM | POA: Diagnosis not present

## 2021-11-11 DIAGNOSIS — M1611 Unilateral primary osteoarthritis, right hip: Secondary | ICD-10-CM | POA: Diagnosis not present

## 2021-11-23 DIAGNOSIS — M25551 Pain in right hip: Secondary | ICD-10-CM | POA: Diagnosis not present

## 2021-11-24 DIAGNOSIS — Z85828 Personal history of other malignant neoplasm of skin: Secondary | ICD-10-CM | POA: Diagnosis not present

## 2021-11-24 DIAGNOSIS — R69 Illness, unspecified: Secondary | ICD-10-CM | POA: Diagnosis not present

## 2021-11-24 DIAGNOSIS — E039 Hypothyroidism, unspecified: Secondary | ICD-10-CM | POA: Diagnosis not present

## 2021-11-24 DIAGNOSIS — G4700 Insomnia, unspecified: Secondary | ICD-10-CM | POA: Diagnosis not present

## 2021-11-24 DIAGNOSIS — Z79899 Other long term (current) drug therapy: Secondary | ICD-10-CM | POA: Diagnosis not present

## 2021-11-24 DIAGNOSIS — M1990 Unspecified osteoarthritis, unspecified site: Secondary | ICD-10-CM | POA: Diagnosis not present

## 2021-11-24 DIAGNOSIS — J45909 Unspecified asthma, uncomplicated: Secondary | ICD-10-CM | POA: Diagnosis not present

## 2021-11-24 DIAGNOSIS — Z9181 History of falling: Secondary | ICD-10-CM | POA: Diagnosis not present

## 2021-12-05 DIAGNOSIS — M25551 Pain in right hip: Secondary | ICD-10-CM | POA: Diagnosis not present

## 2021-12-05 DIAGNOSIS — M1611 Unilateral primary osteoarthritis, right hip: Secondary | ICD-10-CM | POA: Diagnosis not present

## 2021-12-19 ENCOUNTER — Other Ambulatory Visit (HOSPITAL_COMMUNITY)
Admission: RE | Admit: 2021-12-19 | Discharge: 2021-12-19 | Disposition: A | Discharge status: Home / Self Care | Payer: Medicare HMO | Source: Ambulatory Visit | Attending: Obstetrics & Gynecology | Admitting: Obstetrics & Gynecology

## 2021-12-19 ENCOUNTER — Encounter (HOSPITAL_BASED_OUTPATIENT_CLINIC_OR_DEPARTMENT_OTHER): Payer: Self-pay | Admitting: Obstetrics & Gynecology

## 2021-12-19 ENCOUNTER — Other Ambulatory Visit (HOSPITAL_BASED_OUTPATIENT_CLINIC_OR_DEPARTMENT_OTHER): Payer: Self-pay

## 2021-12-19 ENCOUNTER — Ambulatory Visit (INDEPENDENT_AMBULATORY_CARE_PROVIDER_SITE_OTHER): Payer: Medicare HMO | Admitting: Obstetrics & Gynecology

## 2021-12-19 VITALS — BP 136/79 | HR 82 | Ht 63.0 in | Wt 153.4 lb

## 2021-12-19 DIAGNOSIS — Z01419 Encounter for gynecological examination (general) (routine) without abnormal findings: Secondary | ICD-10-CM

## 2021-12-19 DIAGNOSIS — J4520 Mild intermittent asthma, uncomplicated: Secondary | ICD-10-CM | POA: Diagnosis not present

## 2021-12-19 DIAGNOSIS — Z780 Asymptomatic menopausal state: Secondary | ICD-10-CM

## 2021-12-19 DIAGNOSIS — Z8659 Personal history of other mental and behavioral disorders: Secondary | ICD-10-CM

## 2021-12-19 DIAGNOSIS — Z803 Family history of malignant neoplasm of breast: Secondary | ICD-10-CM

## 2021-12-19 DIAGNOSIS — M8580 Other specified disorders of bone density and structure, unspecified site: Secondary | ICD-10-CM | POA: Diagnosis not present

## 2021-12-19 DIAGNOSIS — Z124 Encounter for screening for malignant neoplasm of cervix: Secondary | ICD-10-CM | POA: Insufficient documentation

## 2021-12-19 DIAGNOSIS — F3341 Major depressive disorder, recurrent, in partial remission: Secondary | ICD-10-CM

## 2021-12-19 DIAGNOSIS — R69 Illness, unspecified: Secondary | ICD-10-CM | POA: Diagnosis not present

## 2021-12-19 MED ORDER — AREXVY 120 MCG/0.5ML IM SUSR
INTRAMUSCULAR | 0 refills | Status: DC
  Filled 2021-12-19: qty 0.5, 1d supply, fill #0

## 2021-12-19 MED ORDER — CITALOPRAM HYDROBROMIDE 40 MG PO TABS: 40.0000 mg | ORAL_TABLET | Freq: Every day | ORAL | 3 refills | Status: DC

## 2021-12-19 NOTE — Progress Notes (Signed)
71 y.o. [id number] Married White or Caucasian female here for breast and pelvic exam.  I am also following her for family history of breast cancer.  Has questions about having genetic testing.  Will refer her.  Denies vaginal bleeding.  Patient's last menstrual period was 04/11/2003 (approximate).          Sexually active: No.  H/O STD:  no  Health Maintenance: PCP:  Dr. Carlota Raspberry.  Last wellness appt was 06/2021.  Did blood work at that appt:  yes Vaccines are up to date:  RSV discussed Colonoscopy:  08/19/2014, hyperplastic polyps.  Follow up 10 years.   MMG:  02/23/2021 Additional images needed.  Follow up was negative BMD:  12/23/2018 osteopenia Last pap smear:  10/03/2018 Negative.   H/o abnormal pap smear:  no   reports that she has never smoked. She has never used smokeless tobacco. She reports current alcohol use of about 2.0 standard drinks of alcohol per week. She reports that she does not use drugs.  Past Medical History:  Diagnosis Date   Allergy    uses Flonase daily as needed and takes Loratadine daily    Anemia    only after birth of 2nd child   Arthritis    Asthma    uses Albuterol daily as needed;exercise induced   Cancer ([facility]) 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, right cheek   Depression    takes Celexa daily   Depression    Phreesia 07/01/2020   GERD (gastroesophageal reflux disease)    occ   History of colon polyps    History of shingles    5-6 yrs   Hyperlipidemia    Hypothyroid    Insomnia    takes Restoril nightly as needed   Interstitial cystitis    Osteoarthritis    Sjogren's syndrome ([place])    Spinal headache    after 1st C/S, o2sat low after shoulder surgery after went to room(51%),hard to urinate)   Thyroid disease    Phreesia 09/21/2019    Past Surgical History:  Procedure Laterality Date   CARPAL TUNNEL RELEASE Right 05/2007   CESAREAN SECTION  1981/1984   x 2   COLONOSCOPY     JOINT REPLACEMENT N/A    Phreesia 09/21/2019   TONSILLECTOMY AND  ADENOIDECTOMY  age 28   TOTAL SHOULDER ARTHROPLASTY Right 07/04/2013   DR NORRIS   TOTAL SHOULDER ARTHROPLASTY Right 07/04/2013   Procedure: RIGHT TOTAL SHOULDER ARTHROPLASTY;  Surgeon: Augustin Schooling, MD;  Location: [place];  Service: Orthopedics;  Laterality: Right;   TOTAL SHOULDER ARTHROPLASTY Left 03/12/2015   Procedure: LEFT SHOULDER TOTAL SHOULDER ARTHROPLASTY;  Surgeon: Netta Cedars, MD;  Location: [place];  Service: Orthopedics;  Laterality: Left;    Current Outpatient Medications  Medication Sig Dispense Refill   albuterol (PROAIR HFA) 108 (90 Base) MCG/ACT inhaler INHALE 2 PUFFS INTO THE LUNGS EVERY 6 (SIX) HOURS AS NEEDED FOR SHORTNESS OF BREATH. 18 g 0   cholecalciferol (VITAMIN D) 1000 UNITS tablet Take 1,000 Units by mouth daily.     docusate sodium (COLACE) 100 MG capsule Take 400 mg by mouth at bedtime.      fluticasone (FLONASE) 50 MCG/ACT nasal spray USE 1-2 SPRAYS INTO EACH NOSTRIL ONCE A DAY AS NEEDED 48 mL 1   levothyroxine (SYNTHROID) 75 MCG tablet Take 1 tablet (75 mcg total) by mouth daily. 90 tablet 3   loratadine (CLARITIN) 10 MG tablet Take 10 mg by mouth daily as needed for allergies.  meloxicam (MOBIC) 15 MG tablet TAKE 1 TABLET (15 MG TOTAL) BY MOUTH DAILY. 30 tablet 0   temazepam (RESTORIL) 15 MG capsule TAKE 1 CAPSULE BY MOUTH EVERY DAY AT BEDTIME AS NEEDED 30 capsule 0   vitamin B-12 (CYANOCOBALAMIN) 1000 MCG tablet Take 1,000 mcg by mouth every other day.      citalopram (CELEXA) 40 MG tablet Take 1 tablet (40 mg total) by mouth daily. 90 tablet 3   No current facility-administered medications for this visit.    Family History  Problem Relation Age of Onset   Breast cancer Sister 68       stage 34, chemo and radiation   Esophageal cancer Sister    Diabetes Brother    Epilepsy Daughter    Hypertension Mother    Osteoporosis Mother    Diabetes Father    Hypertension Father    Lung cancer Paternal Aunt     Review of Systems  Constitutional:  Negative.   Genitourinary: Negative.     Exam:   BP 136/79 (BP Location: Right Arm, Patient Position: Sitting, Cuff Size: Large)   Pulse 82   Ht '5\' 3"'$  (1.6 m)   Wt 153 lb 6.4 oz (69.6 kg)   LMP 04/11/2003 (Approximate)   BMI 27.17 kg/m   Height: '5\' 3"'$  (160 cm)  General appearance: alert, cooperative and appears stated age Breasts: normal appearance, no masses or tenderness Abdomen: soft, non-tender; bowel sounds normal; no masses,  no organomegaly Lymph nodes: Cervical, supraclavicular, and axillary nodes normal.  No abnormal inguinal nodes palpated Neurologic: Grossly normal  Pelvic: External genitalia:  no lesions              Urethra:  normal appearing urethra with no masses, tenderness or lesions              Bartholins and Skenes: normal                 Vagina: normal appearing vagina with atrophic changes and no discharge, no lesions              Cervix: no lesions              Pap taken: Yes.   Bimanual Exam:  Uterus:  normal size, contour, position, consistency, mobility, non-tender              Adnexa: normal adnexa and no mass, fullness, tenderness               Rectovaginal: Confirms               Anus:  normal sphincter tone, no lesions  Chaperone, Octaviano Batty, CMA, was present for exam.  Assessment/Plan: 1. Encntr for gyn exam (general) (routine) w/o abn findings - Pap smear obtained today - Mammogram 11/202 - Colonoscopy 2016 - Bone mineral density done 2020.  T score -1.1  Plan again in two years - lab work done done with PCP - vaccines reviewed/updated.  Discussed RSV vaccination.  2. Postmenopausal - no HRT  3. Family history of breast cancer - Ambulatory referral to Genetics  4. Osteopenia, unspecified location  5. Cervical cancer screening - Cytology - PAP( [place]) - PR OBTAINING SCREEN PAP SMEAR  6. History of depression - citalopram (CELEXA) 40 MG tablet; Take 1 tablet (40 mg total) by mouth daily.  Dispense: 90 tablet; Refill:  3

## 2021-12-21 ENCOUNTER — Telehealth: Payer: Self-pay | Admitting: Genetic Counselor

## 2021-12-21 NOTE — Telephone Encounter (Signed)
Scheduled appt per 9/12 referral. Pt is aware of appt date and time. Pt is aware to arrive 15 mins prior to appt time and to bring and updated insurance card. Pt is aware of appt location.   

## 2021-12-23 LAB — CYTOLOGY - PAP
Adequacy: ABSENT
Diagnosis: NEGATIVE

## 2022-01-04 ENCOUNTER — Ambulatory Visit: Payer: Medicare HMO | Admitting: Family Medicine

## 2022-01-04 DIAGNOSIS — M1611 Unilateral primary osteoarthritis, right hip: Secondary | ICD-10-CM | POA: Diagnosis not present

## 2022-01-17 ENCOUNTER — Telehealth: Payer: Self-pay | Admitting: Family Medicine

## 2022-01-17 NOTE — Telephone Encounter (Signed)
Lab order received from Dr. Alvan Dame at [facility] for CMP, CBC.  Typically would not order labs for other office.  Not sure if that was meant to be ordered elsewhere or if they would like me to draw that blood work.  I just need to know more information on why we are checking it.  Thanks.  Paperwork will be in my folder at nurse station.

## 2022-01-17 NOTE — Telephone Encounter (Signed)
Spoke to [place] at [facility] . She sees the order for a CBC for a right hip replacement . Order was suppose to go [facility] . They need Dr Carlota Raspberry to sign the surgical clearance . [facility] has ordered the blood work we do not have to do the labs

## 2022-01-19 ENCOUNTER — Ambulatory Visit (INDEPENDENT_AMBULATORY_CARE_PROVIDER_SITE_OTHER): Payer: Medicare HMO | Admitting: Family Medicine

## 2022-01-19 ENCOUNTER — Encounter: Payer: Self-pay | Admitting: Family Medicine

## 2022-01-19 VITALS — BP 126/86 | HR 88 | Temp 98.0°F | Ht 63.0 in | Wt 156.2 lb

## 2022-01-19 DIAGNOSIS — Z23 Encounter for immunization: Secondary | ICD-10-CM

## 2022-01-19 DIAGNOSIS — E039 Hypothyroidism, unspecified: Secondary | ICD-10-CM | POA: Diagnosis not present

## 2022-01-19 DIAGNOSIS — R7303 Prediabetes: Secondary | ICD-10-CM

## 2022-01-19 DIAGNOSIS — Z01818 Encounter for other preprocedural examination: Secondary | ICD-10-CM | POA: Diagnosis not present

## 2022-01-19 NOTE — Telephone Encounter (Signed)
Form received and preop visit completed today. Labs/xr pending.

## 2022-01-19 NOTE — Patient Instructions (Addendum)
I will review labs, x-ray, and complete paperwork for surgeon once I have those results. Good luck and thanks for coming in today.

## 2022-01-19 NOTE — Progress Notes (Signed)
Subjective:  Patient ID: Christine Valdez, female    DOB: 12-Mar-1951  Age: 71 y.o. MRN: [id number]  CC:  Chief Complaint  Patient presents with   Surgical Clearance     Pt is in for surgical clearance Pt is not followed by cardio    HPI Christine Valdez presents for   Preop clearance R total hip replacement - Dr. Alvan Dame.  02/16/22.  Takes Synthroid for hypothyroidism. No hx of OSA, CHF, TIA/CVA, CKD or diabetes.  No CP/dyspnea with flight of stairs, or walking 2 blocks. No problems with anesthesia in past.  No current blood thinners. Some stomach upset with meloxicam - she will d/w ortho.   Lab Results  Component Value Date   TSH 0.53 07/04/2021   Lab Results  Component Value Date   CREATININE 0.97 07/04/2021   Lab Results  Component Value Date   HGBA1C 6.2 07/04/2021    History Patient Active Problem List   Diagnosis Date Noted   Influenza with respiratory manifestation other than pneumonia 05/05/2018   Recurrent major depressive disorder, in partial remission ([place]) 09/28/2016   Hypothyroidism 09/28/2016   B12 deficiency 09/28/2016   Hyperglycemia 09/28/2016   Pruritus ani 06/25/2015   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   Anxiety 02/04/2015   Major depression, recurrent ([place]) 02/04/2015   Mild intermittent asthma without complication 02/77/4128   Osteoarthrosis, unspecified whether generalized or localized, shoulder region 07/04/2013   Past Medical History:  Diagnosis Date   Allergy    uses Flonase daily as needed and takes Loratadine daily    Anemia    only after birth of 2nd child   Arthritis    Asthma    uses Albuterol daily as needed;exercise induced   Cancer ([place]) 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, right cheek   Depression    takes Celexa daily   Depression    Phreesia 07/01/2020   GERD (gastroesophageal reflux disease)    occ   History of colon polyps    History of shingles    5-6 yrs   Hyperlipidemia    Hypothyroid    Insomnia    takes  Restoril nightly as needed   Interstitial cystitis    Osteoarthritis    Sjogren's syndrome ([place])    Spinal headache    after 1st C/S, o2sat low after shoulder surgery after went to room(51%),hard to urinate)   Thyroid disease    Phreesia 09/21/2019   Past Surgical History:  Procedure Laterality Date   CARPAL TUNNEL RELEASE Right 05/2007   CESAREAN SECTION  1981/1984   x 2   COLONOSCOPY     JOINT REPLACEMENT N/A    Phreesia 09/21/2019   TONSILLECTOMY AND ADENOIDECTOMY  age 52   TOTAL SHOULDER ARTHROPLASTY Right 07/04/2013   DR NORRIS   TOTAL SHOULDER ARTHROPLASTY Right 07/04/2013   Procedure: RIGHT TOTAL SHOULDER ARTHROPLASTY;  Surgeon: Augustin Schooling, MD;  Location: [facility];  Service: Orthopedics;  Laterality: Right;   TOTAL SHOULDER ARTHROPLASTY Left 03/12/2015   Procedure: LEFT SHOULDER TOTAL SHOULDER ARTHROPLASTY;  Surgeon: Netta Cedars, MD;  Location: [facility];  Service: Orthopedics;  Laterality: Left;   Allergies  Allergen Reactions   Diflucan [Fluconazole] Hives    unknown   Thimerosal (Thiomersal) Hives   Mercury Detox [Nutritional Supplements] Hives   Influenza Vaccine Recombinant Rash    Mercury component and Thierosal in flu vaccine   Levaquin [Levofloxacin] Rash   Sulfa Antibiotics Rash   Prior to Admission medications   Medication Sig Start  Date End Date Taking? Authorizing Provider  albuterol (PROAIR HFA) 108 (90 Base) MCG/ACT inhaler INHALE 2 PUFFS INTO THE LUNGS EVERY 6 (SIX) HOURS AS NEEDED FOR SHORTNESS OF BREATH. 09/24/19  Yes Wendie Agreste, MD  cholecalciferol (VITAMIN D) 1000 UNITS tablet Take 1,000 Units by mouth daily.   Yes [provider]  citalopram (CELEXA) 40 MG tablet Take 1 tablet (40 mg total) by mouth daily. 12/19/21  Yes Megan Salon, MD  docusate sodium (COLACE) 100 MG capsule Take 400 mg by mouth at bedtime.    Yes [provider]  fluticasone (FLONASE) 50 MCG/ACT nasal spray USE 1-2 SPRAYS INTO EACH NOSTRIL ONCE A DAY AS  NEEDED 06/05/19  Yes Delia Chimes A, MD  levothyroxine (SYNTHROID) 75 MCG tablet Take 1 tablet (75 mcg total) by mouth daily. 07/04/21  Yes Wendie Agreste, MD  loratadine (CLARITIN) 10 MG tablet Take 10 mg by mouth daily as needed for allergies.    Yes [provider]  meloxicam (MOBIC) 15 MG tablet TAKE 1 TABLET (15 MG TOTAL) BY MOUTH DAILY. 05/31/21  Yes Lorenda Peck, DPM  vitamin B-12 (CYANOCOBALAMIN) 1000 MCG tablet Take 1,000 mcg by mouth every other day.    Yes [provider]  RSV vaccine recomb adjuvanted (AREXVY) 120 MCG/0.5ML injection Inject into the muscle. Patient not taking: Reported on 01/19/2022 12/19/21   Carlyle Basques, MD  temazepam (RESTORIL) 15 MG capsule TAKE 1 CAPSULE BY MOUTH EVERY DAY AT BEDTIME AS NEEDED Patient not taking: Reported on 01/19/2022 10/20/21   Wendie Agreste, MD   Social History   Socioeconomic History   Marital status: Married    Spouse name: Not on file   Number of children: 2   Years of education: Not on file   Highest education level: Not on file  Occupational History   Not on file  Tobacco Use   Smoking status: Never   Smokeless tobacco: Never  Vaping Use   Vaping Use: Never used  Substance and Sexual Activity   Alcohol use: Yes    Alcohol/week: 2.0 standard drinks of alcohol    Types: 2 Glasses of wine per week   Drug use: No   Sexual activity: Not Currently    Partners: Male    Birth control/protection: Post-menopausal  Other Topics Concern   Not on file  Social History Narrative   Work or School: nanny for multiple families      Home Situation: son a Theme park manager      Spiritual Beliefs: Christian      Lifestyle: no regular exercise, diet not great   Social Determinants of Health   Financial Resource Strain: Not on file  Food Insecurity: Not on file  Transportation Needs: Not on file  Physical Activity: Not on file  Stress: Not on file  Social Connections: Not on file  Intimate Partner Violence: Not  on file   Review of Systems Per HPI.   Objective:   Vitals:   01/19/22 1531  BP: 126/86  Pulse: 88  Temp: 98 F (36.7 C)  SpO2: 96%  Weight: 156 lb 3.2 oz (70.9 kg)  Height: '5\' 3"'$  (1.6 m)     Physical Exam Vitals reviewed.  Constitutional:      Appearance: Normal appearance. She is well-developed.  HENT:     Head: Normocephalic and atraumatic.  Eyes:     Conjunctiva/sclera: Conjunctivae normal.     Pupils: Pupils are equal, round, and reactive to light.  Neck:  Vascular: No carotid bruit.  Cardiovascular:     Rate and Rhythm: Normal rate and regular rhythm.     Heart sounds: Normal heart sounds.  Pulmonary:     Effort: Pulmonary effort is normal.     Breath sounds: Normal breath sounds.  Abdominal:     Palpations: Abdomen is soft. There is no pulsatile mass.     Tenderness: There is no abdominal tenderness.  Musculoskeletal:     Right lower leg: No edema.     Left lower leg: No edema.  Skin:    General: Skin is warm and dry.  Neurological:     Mental Status: She is alert and oriented to person, place, and time.  Psychiatric:        Mood and Affect: Mood normal.        Behavior: Behavior normal.    EKG, sinus rhythm, nonspecific T wave in lead III.Similar to EKG previously on 11/13/18 no acute changes. Assessment & Plan:  Christine Valdez is a 71 y.o. female . Pre-operative clearance - Plan: EKG 12-Lead, CBC, Basic metabolic panel, Hemoglobin A1c, DG Chest 2 View, TSH  Need for influenza vaccination - Plan: Flu Vaccine QUAD High Dose(Fluad)  Prediabetes - Plan: Hemoglobin A1c  Hypothyroidism, unspecified type - Plan: TSH  RCRI/Goldman criteria low risk for major adverse cardiac event.  No apparent increased risk based on metabolic equivalents without symptoms.  Appears to be at acceptable risk for planned surgery, screening labs and chest x-ray as above.  We will complete paperwork once results reviewed. No orders of the defined types were placed  in this encounter.  Patient Instructions  I will review labs, x-ray, and complete paperwork for surgeon once I have those results. Good luck and thanks for coming in today.     Signed,   Merri Ray, MD [facility] 01/19/22 4:40 PM

## 2022-01-20 ENCOUNTER — Ambulatory Visit: Payer: Medicare HMO | Admitting: Family Medicine

## 2022-01-20 ENCOUNTER — Other Ambulatory Visit: Payer: Self-pay | Admitting: Obstetrics & Gynecology

## 2022-01-20 ENCOUNTER — Other Ambulatory Visit: Payer: Self-pay

## 2022-01-20 ENCOUNTER — Ambulatory Visit (INDEPENDENT_AMBULATORY_CARE_PROVIDER_SITE_OTHER)
Admission: RE | Admit: 2022-01-20 | Discharge: 2022-01-20 | Disposition: A | Discharge status: Home / Self Care | Payer: Medicare HMO | Source: Ambulatory Visit | Attending: Family Medicine | Admitting: Family Medicine

## 2022-01-20 DIAGNOSIS — Z01818 Encounter for other preprocedural examination: Secondary | ICD-10-CM

## 2022-01-20 DIAGNOSIS — J4520 Mild intermittent asthma, uncomplicated: Secondary | ICD-10-CM

## 2022-01-20 DIAGNOSIS — Z1231 Encounter for screening mammogram for malignant neoplasm of breast: Secondary | ICD-10-CM

## 2022-01-20 LAB — CBC
HCT: 41.7 % (ref 36.0–46.0)
Hemoglobin: 13.8 g/dL (ref 12.0–15.0)
MCHC: 33.2 g/dL (ref 30.0–36.0)
MCV: 91.7 fl (ref 78.0–100.0)
Platelets: 176 10*3/uL (ref 150.0–400.0)
RBC: 4.54 Mil/uL (ref 3.87–5.11)
RDW: 13.4 % (ref 11.5–15.5)
WBC: 5.5 10*3/uL (ref 4.0–10.5)

## 2022-01-20 LAB — BASIC METABOLIC PANEL
BUN: 16 mg/dL (ref 6–23)
CO2: 29 mEq/L (ref 19–32)
Calcium: 10.4 mg/dL (ref 8.4–10.5)
Chloride: 100 mEq/L (ref 96–112)
Creatinine, Ser: 0.97 mg/dL (ref 0.40–1.20)
GFR: 59.73 mL/min — ABNORMAL LOW (ref >60.00)
Glucose, Bld: 93 mg/dL (ref 70–99)
Potassium: 4.8 mEq/L (ref 3.5–5.1)
Sodium: 135 mEq/L (ref 135–145)

## 2022-01-20 LAB — HEMOGLOBIN A1C: Hgb A1c MFr Bld: 6.1 % (ref 4.6–6.5)

## 2022-01-20 LAB — TSH: TSH: 1.27 u[IU]/mL (ref 0.35–5.50)

## 2022-01-20 MED ORDER — TEMAZEPAM 30 MG PO CAPS: ORAL_CAPSULE | ORAL | 0 refills | Status: DC

## 2022-01-20 MED ORDER — ALBUTEROL SULFATE HFA 108 (90 BASE) MCG/ACT IN AERS: INHALATION_SPRAY | RESPIRATORY_TRACT | 0 refills | Status: DC

## 2022-01-20 NOTE — Progress Notes (Signed)
Patient called in regarding her preop paper work and results. She just wanted the CMA and Dr Carlota Raspberry to know that the surgeon's office needed that by October 26th. Patient also states that she inquired about having temazepam and albuterol refilled when she was in yesterday but didn't get an answer. I let her know that I would send in the albuterol but would ask about the temazepam since I no longer saw that on her med list. Patient voiced understanding.   Please call patient once the medication has been sent in and the preop clearance information has been faxed. ]  Pharmacy [facility]

## 2022-01-20 NOTE — Telephone Encounter (Signed)
Controlled substance database reviewed, #2 of diazepam 10 mg on 11/18/2021, previous temazepam 15 mg #30 last filled 10/20/2021.  Refill ordered.

## 2022-01-20 NOTE — Telephone Encounter (Signed)
Patient called in regarding her preop paper work and results. She just wanted the CMA and Dr Carlota Raspberry to know that the surgeon's office needed that by October 26th. Patient also states that she inquired about having temazepam and albuterol refilled when she was in yesterday but didn't get an answer. I let her know that I would send in the albuterol but would ask about the temazepam since I no longer saw that on her med list. Patient voiced understanding.    Please call patient once the medication has been sent in and the preop clearance information has been faxed. ]   Pharmacy [facility]

## 2022-01-20 NOTE — Telephone Encounter (Signed)
Pt notified that Rx was sent in

## 2022-01-20 NOTE — Telephone Encounter (Signed)
Patient is requesting a refill of the following medications: Requested Prescriptions   Pending Prescriptions Disp Refills   temazepam (RESTORIL) 30 MG capsule 30 capsule 0    Sig: TAKE 1 CAPSULE BY MOUTH EVERY DAY AT BEDTIME AS NEEDED    Date of patient request: 01/20/22 Last office visit: 01/19/22 Date of last refill: 10/20/21 Last refill amount: 30

## 2022-01-20 NOTE — Telephone Encounter (Signed)
Refill request sent to provider, will need to document when preop is faxed back to surgeons office

## 2022-01-20 NOTE — Addendum Note (Signed)
Addended by: Merri Ray R on: 01/20/2022 03:41 PM   Modules accepted: Orders

## 2022-01-20 NOTE — Addendum Note (Signed)
Addended by: Patrcia Dolly on: 01/20/2022 10:59 AM   Modules accepted: Orders

## 2022-01-23 ENCOUNTER — Encounter: Payer: Self-pay | Admitting: Family Medicine

## 2022-01-23 ENCOUNTER — Telehealth: Payer: Self-pay | Admitting: Genetic Counselor

## 2022-01-23 NOTE — Telephone Encounter (Signed)
R/s pt's genetics appt. Pt had requested a r/s through vm. I called pt, no answer. Left msg with new appt date/time. Requested for pt to call back to confirm appt.

## 2022-01-30 ENCOUNTER — Other Ambulatory Visit: Payer: Self-pay | Admitting: Family Medicine

## 2022-01-30 DIAGNOSIS — J4520 Mild intermittent asthma, uncomplicated: Secondary | ICD-10-CM

## 2022-02-03 ENCOUNTER — Telehealth: Payer: Self-pay | Admitting: Lab

## 2022-02-03 NOTE — Telephone Encounter (Signed)
Received paperwork from Dr. Carlota Raspberry called [facility] and they already had a copy of it 02/03/2022

## 2022-02-16 ENCOUNTER — Other Ambulatory Visit: Payer: Medicare HMO

## 2022-02-16 ENCOUNTER — Encounter: Payer: Medicare HMO | Admitting: Genetic Counselor

## 2022-02-16 DIAGNOSIS — M1611 Unilateral primary osteoarthritis, right hip: Secondary | ICD-10-CM | POA: Diagnosis not present

## 2022-03-13 ENCOUNTER — Ambulatory Visit: Payer: Medicare HMO

## 2022-03-22 DIAGNOSIS — Z5189 Encounter for other specified aftercare: Secondary | ICD-10-CM | POA: Diagnosis not present

## 2022-03-23 ENCOUNTER — Other Ambulatory Visit: Payer: Self-pay

## 2022-03-23 ENCOUNTER — Inpatient Hospital Stay: Payer: Medicare HMO | Attending: Obstetrics & Gynecology | Admitting: Genetic Counselor

## 2022-03-23 ENCOUNTER — Inpatient Hospital Stay: Payer: Medicare HMO

## 2022-03-23 ENCOUNTER — Encounter: Payer: Self-pay | Admitting: Genetic Counselor

## 2022-03-23 DIAGNOSIS — Z85828 Personal history of other malignant neoplasm of skin: Secondary | ICD-10-CM | POA: Diagnosis not present

## 2022-03-23 DIAGNOSIS — Z803 Family history of malignant neoplasm of breast: Secondary | ICD-10-CM

## 2022-03-23 LAB — GENETIC SCREENING ORDER

## 2022-03-23 NOTE — Progress Notes (Signed)
REFERRING PROVIDER: Megan Salon, MD [address],  [place] [address]  PRIMARY PROVIDER:  Wendie Agreste, MD  PRIMARY REASON FOR VISIT:  1. Family history of breast cancer    HISTORY OF PRESENT ILLNESS:   Christine Valdez, a 71 y.o. female, was seen for a [facility] cancer genetics consultation at the request of Dr. Sabra Heck due to a family history of cancer.  Ms. Riggan presents to clinic today to discuss the possibility of a hereditary predisposition to cancer, to discuss genetic testing, and to further clarify her future cancer risks, as well as potential cancer risks for family members.   Ms. Olshefski reports a personal history of squamous cell carcinoma.   RISK FACTORS:  Menarche was at age 35.  First live birth at age 66.  OCP use for approximately 7 years.  Ovaries intact: yes.  Uterus intact: yes.  Menopausal status: postmenopausal, menopause at age 29  HRT use: 0 years. Colonoscopy: yes;  reports a history of precancerous polyps . Mammogram within the last year: yes. Number of breast biopsies: 0. Up to date with pelvic exams: yes. Any excessive radiation exposure in the past: She worked at a [employer] where they performed x-rays but she used standard precautions.   Past Medical History:  Diagnosis Date   Allergy    uses Flonase daily as needed and takes Loratadine daily    Anemia    only after birth of 2nd child   Arthritis    Asthma    uses Albuterol daily as needed;exercise induced   Cancer ([place]) 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, right cheek   Depression    takes Celexa daily   Depression    Phreesia 07/01/2020   GERD (gastroesophageal reflux disease)    occ   History of colon polyps    History of shingles    5-6 yrs   Hyperlipidemia    Hypothyroid    Insomnia    takes Restoril nightly as needed   Interstitial cystitis    Osteoarthritis    Sjogren's syndrome ([place])    Spinal headache    after 1st C/S, o2sat low after shoulder surgery after  went to room(51%),hard to urinate)   Thyroid disease    Phreesia 09/21/2019    Past Surgical History:  Procedure Laterality Date   CARPAL TUNNEL RELEASE Right 05/2007   CESAREAN SECTION  1981/1984   x 2   COLONOSCOPY     JOINT REPLACEMENT N/A    Phreesia 09/21/2019   TONSILLECTOMY AND ADENOIDECTOMY  age 21   TOTAL SHOULDER ARTHROPLASTY Right 07/04/2013   DR NORRIS   TOTAL SHOULDER ARTHROPLASTY Right 07/04/2013   Procedure: RIGHT TOTAL SHOULDER ARTHROPLASTY;  Surgeon: Augustin Schooling, MD;  Location: [facility];  Service: Orthopedics;  Laterality: Right;   TOTAL SHOULDER ARTHROPLASTY Left 03/12/2015   Procedure: LEFT SHOULDER TOTAL SHOULDER ARTHROPLASTY;  Surgeon: Netta Cedars, MD;  Location: [facility];  Service: Orthopedics;  Laterality: Left;    Social History   Socioeconomic History   Marital status: Married    Spouse name: Not on file   Number of children: 2   Years of education: Not on file   Highest education level: Not on file  Occupational History   Not on file  Tobacco Use   Smoking status: Never   Smokeless tobacco: Never  Vaping Use   Vaping Use: Never used  Substance and Sexual Activity   Alcohol use: Yes    Alcohol/week: 2.0 standard  drinks of alcohol    Types: 2 Glasses of wine per week   Drug use: No   Sexual activity: Not Currently    Partners: Male    Birth control/protection: Post-menopausal  Other Topics Concern   Not on file  Social History Narrative   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  Significant diagnoses are listed  below: Family History  Problem Relation Age of Onset   Hypertension Mother    Osteoporosis Mother    Diabetes Father    Hypertension Father    Breast cancer Sister 36       stage 98, chemo and radiation   Esophageal cancer Sister        breast cancer met to esophagus   Diabetes Brother    Lung cancer Paternal Aunt    Lung cancer Paternal 82    Brain cancer Paternal Uncle    Epilepsy Daughter      Ms. Egge's sister was diagnosed with breast cancer at age 29, her breast cancer metastasized to her esophagus and she died at age 21. Ms. Yingling had 2 paternal aunts and both had a history of lung cancer, they smoked and are deceased. Her paternal uncle died due to metastatic brain cancer. Ms. Cazarez is unaware of previous family history of genetic testing for hereditary cancer risks. There is no reported Ashkenazi Jewish ancestry.   GENETIC COUNSELING ASSESSMENT: Ms. Kesling is a 72 y.o. female with a family history of cancer which is somewhat suggestive of a hereditary predisposition to cancer. We, therefore, discussed and recommended the following at today's visit.   DISCUSSION: We discussed that 5 - 10% of cancer is hereditary, with most cases of hereditary breast cancer associated with BRCA1/2.  There are other genes that can be associated with hereditary breast cancer syndromes.  We discussed that testing is beneficial for several reasons, including knowing about other cancer risks, identifying potential screening and risk-reduction options that may be appropriate, and to understanding if other family members could be at risk for cancer and allowing them to undergo genetic testing.  We reviewed the characteristics, features and inheritance patterns of hereditary cancer syndromes. We also discussed genetic testing, including the appropriate family members to test, the process of testing, insurance coverage and turn-around-time for results. We discussed the implications of a negative,  positive, carrier and/or variant of uncertain significant result. We discussed that negative results would be uninformative given that Ms. Garretson does not have a personal history of cancer. We recommended Ms. Ruff pursue genetic testing for a panel that contains genes associated with breast cancer.  Ms. Hebert was offered a common hereditary cancer panel (47 genes) and an expanded pan-cancer panel (77 genes). Ms. Talerico was informed of the benefits and limitations of each panel, including that expanded pan-cancer panels contain several genes that do not have clear management guidelines at this point in time.  We also discussed that as the number of genes included on a panel increases, the chances of  variants of uncertain significance increases.  After considering the benefits and limitations of each gene panel, Ms. Sutley elected to have [facility] Panel.  The CustomNext-Cancer+RNAinsight panel offered by [facility] includes sequencing and rearrangement analysis for the following 47 genes:  APC, ATM, AXIN2, BARD1, BMPR1A, BRCA1, BRCA2, BRIP1, CDH1, CDK4, CDKN2A, CHEK2, CTNNA1, DICER1, EPCAM, GREM1, HOXB13, KIT, MEN1, MLH1, MSH2, MSH3, MSH6, MUTYH, NBN, NF1, NTHL1, PALB2, PDGFRA, PMS2, POLD1, POLE, PTEN, RAD50, RAD51C, RAD51D, SDHA, SDHB, SDHC, SDHD, SMAD4, SMARCA4, STK11, TP53, TSC1, TSC2, and VHL.  RNA data is routinely analyzed for use in variant interpretation for all genes.  Based on Ms. Bogue's family history of cancer, she does not meet medical criteria for genetic testing. However, she is still interested in pursing testing for the benefit of herself and her children. There may be an out of pocket cost. We discussed that if her out of pocket cost for testing is over $100, the laboratory will call and confirm whether she wants to proceed with testing.  If the out of pocket cost of testing is less than $100 she will be billed by the genetic testing laboratory.   We discussed that some  people do not want to undergo genetic testing due to fear of genetic discrimination.  A federal law called the Genetic Information Non-Discrimination Act (GINA) of 2008 helps protect individuals against genetic discrimination based on their genetic test results.  It impacts both health insurance and employment.  With health insurance, it protects against increased premiums, being kicked off insurance or being forced to take a test in order to be insured.  For employment it protects against hiring, firing and promoting decisions based on genetic test results.  GINA does not apply to those in the [employer], those who work for companies with less than 15 employees, and new life insurance or long-term disability insurance policies.  Health status due to a cancer diagnosis is not protected under GINA.  PLAN: After considering the risks, benefits, and limitations, Ms. Scioneaux provided informed consent to pursue genetic testing and the blood sample was sent to [facility] for analysis of the CustomNext Panel. Results should be available within approximately 2-3 weeks' time, at which point they will be disclosed by telephone to Ms. Schrager, as will any additional recommendations warranted by these results. Ms. Stelzer will receive a summary of her genetic counseling visit and a copy of her results once available. This information will also be available in Epic.   Ms. Haralson questions were answered to her satisfaction today. Our contact information was provided should additional questions or concerns arise. Thank you for the referral and allowing [place] to share in the care of your patient.   Lucille Passy, MS, [facility] Genetic Counselor [email or web address] (P) [phone]  The patient was seen for a total of 55 minutes in face-to-face genetic counseling. The patient was seen alone.  Drs. Lindi Adie and/or Burr Medico were available to discuss this case as needed.   _______________________________________________________________________ For Office Staff:  Number of people involved in session: 1 Was an Intern/ student involved with case: no

## 2022-04-11 ENCOUNTER — Encounter: Payer: Self-pay | Admitting: Genetic Counselor

## 2022-04-11 ENCOUNTER — Telehealth: Payer: Self-pay | Admitting: Genetic Counselor

## 2022-04-11 DIAGNOSIS — Z1379 Encounter for other screening for genetic and chromosomal anomalies: Secondary | ICD-10-CM | POA: Insufficient documentation

## 2022-04-11 NOTE — Telephone Encounter (Signed)
I attempted to contact Christine Valdez to discuss her genetic testing results (47 genes). I left a voicemail requesting she call me back at [phone].  Lucille Passy, MS, [facility] Genetic Counselor [email or web address] (P) [phone]

## 2022-04-14 ENCOUNTER — Telehealth: Payer: Self-pay | Admitting: Genetic Counselor

## 2022-04-14 NOTE — Telephone Encounter (Signed)
I contacted Ms. Paszkiewicz to discuss her genetic testing results. No pathogenic variants were identified in the 47 genes analyzed. Detailed clinic note to follow.  The test report has been scanned into EPIC and is located under the Molecular Pathology section of the Results Review tab.  A portion of the result report is included below for reference.   Lucille Passy, MS, [facility] Genetic Counselor [email or web address] (P) [phone]

## 2022-04-17 ENCOUNTER — Ambulatory Visit: Payer: Self-pay | Admitting: Genetic Counselor

## 2022-04-17 DIAGNOSIS — Z1379 Encounter for other screening for genetic and chromosomal anomalies: Secondary | ICD-10-CM

## 2022-04-17 NOTE — Progress Notes (Signed)
HPI:   Christine Valdez was previously seen in the [facility] clinic due to a family history of cancer and concerns regarding a hereditary predisposition to cancer. Please refer to our prior cancer genetics clinic note for more information regarding our discussion, assessment and recommendations, at the time. Christine Valdez recent genetic test results were disclosed to her, as were recommendations warranted by these results. These results and recommendations are discussed in more detail below.  CANCER HISTORY:  Oncology History   No history exists.    FAMILY HISTORY:  We obtained a detailed, 4-generation family history.  Significant diagnoses are listed below:      Family History  Problem Relation Age of Onset   Hypertension Mother     Osteoporosis Mother     Diabetes Father     Hypertension Father     Breast cancer Sister 68        stage 56, chemo and radiation   Esophageal cancer Sister          breast cancer met to esophagus   Diabetes Brother     Lung cancer Paternal Aunt     Lung cancer Paternal 32     Brain cancer Paternal Uncle     Epilepsy Daughter         Christine Valdez's sister was diagnosed with breast cancer at age 28, her breast cancer metastasized to her esophagus and she died at age 3. Christine Valdez had 2 paternal aunts and both had a history of lung cancer, they smoked and are deceased. Her paternal uncle died due to metastatic brain cancer. Christine Valdez is unaware of previous family history of genetic testing for hereditary cancer risks. There is no reported Ashkenazi Jewish ancestry.   GENETIC TEST RESULTS:  The [facility] CustomNext Panel found no pathogenic mutations.   The CustomNext-Cancer+RNAinsight panel offered by [facility] includes sequencing and rearrangement analysis for the following 47 genes:  APC, ATM, AXIN2, BARD1, BMPR1A, BRCA1, BRCA2, BRIP1, CDH1, CDK4, CDKN2A, CHEK2, CTNNA1, DICER1, EPCAM, GREM1, HOXB13, KIT, MEN1, MLH1, MSH2, MSH3, MSH6, MUTYH,  NBN, NF1, NTHL1, PALB2, PDGFRA, PMS2, POLD1, POLE, PTEN, RAD50, RAD51C, RAD51D, SDHA, SDHB, SDHC, SDHD, SMAD4, SMARCA4, STK11, TP53, TSC1, TSC2, and VHL.  RNA data is routinely analyzed for use in variant interpretation for all genes.  The test report has been scanned into EPIC and is located under the Molecular Pathology section of the Results Review tab.  A portion of the result report is included below for reference. Genetic testing reported out on 04/02/2022.       Even though a pathogenic variant was not identified, possible explanations for the cancer in the family may include: There may be no hereditary risk for cancer in the family. The cancers in her family may be due to other genetic or environmental factors. There may be a gene mutation in one of these genes that current testing methods cannot detect, but that chance is small. There could be another gene that has not yet been discovered, or that we have not yet tested, that is responsible for the cancer diagnoses in the family.  It is also possible there is a hereditary cause for the cancer in the family that Christine Valdez did not inherit.  Therefore, it is important to remain in touch with cancer genetics in the future so that we can continue to offer Christine Valdez the most up to date genetic testing.   ADDITIONAL GENETIC TESTING:  We discussed with [name] that her  genetic testing was fairly extensive.  If there are genes identified to increase cancer risk that can be analyzed in the future, we would be happy to discuss and coordinate this testing at that time.    CANCER SCREENING RECOMMENDATIONS:  [name] test result is considered negative (normal).  This means that we have not identified a hereditary cause for her family history of cancer at this time.   An individual's cancer risk and medical management are not determined by genetic test results alone. Overall cancer risk assessment incorporates additional factors, including  personal medical history, family history, and any available genetic information that may result in a personalized plan for cancer prevention and surveillance. Therefore, it is recommended she continue to follow the cancer management and screening guidelines provided by her primary healthcare provider.  Based on the reported personal and family history, specific cancer screenings for Christine Valdez and her family include:  Breast Cancer Screening:  The Tyrer-Cuzick model is one of multiple prediction models developed to estimate an individual's lifetime risk of developing breast cancer. The Tyrer-Cuzick model is endorsed by the [employer]  (NCCN). This model includes many risk factors such as family history, endogenous estrogen exposure, and benign breast disease. The calculation is highly-dependent on the accuracy of clinical data provided by the patient and can change over time. The Tyrer-Cuzick model may be repeated to reflect new information in her personal or family history in the future.   Christine Valdez Tyrer-Cuzick risk score is 6%. She is encouraged to contact us regarding any changes to her personal or family history, as her recommendations for screening would be altered significantly if her lifetime risk is determined to be greater than 20% based on updated information.    RECOMMENDATIONS FOR FAMILY MEMBERS:   Since she did not inherit a mutation in a cancer predisposition gene included on this panel, her children could not have inherited a mutation from her in one of these genes. Individuals in this family might be at some increased risk of developing cancer, over the general population risk, due to the family history of cancer. We recommend women in this family have a yearly mammogram beginning at age 16, or 46 years younger than the earliest onset of cancer, an annual clinical breast exam, and perform monthly breast self-exams.   FOLLOW-UP:  Cancer  genetics is a rapidly advancing field and it is possible that new genetic tests will be appropriate for her and/or her family members in the future. We encouraged her to remain in contact with cancer genetics on an annual basis so we can update her personal and family histories and let her know of advances in cancer genetics that may benefit this family.   Our contact number was provided. Ms. Fasnacht questions were answered to her satisfaction, and she knows she is welcome to call us at anytime with additional questions or concerns.   Lucille Passy, MS, [facility] Genetic Counselor [email or web address] (P) [phone]

## 2022-04-26 ENCOUNTER — Encounter: Payer: Self-pay | Admitting: Genetic Counselor

## 2022-05-15 ENCOUNTER — Encounter: Payer: Self-pay | Admitting: Family Medicine

## 2022-05-15 ENCOUNTER — Ambulatory Visit (INDEPENDENT_AMBULATORY_CARE_PROVIDER_SITE_OTHER): Payer: Medicare HMO | Admitting: Family Medicine

## 2022-05-15 VITALS — BP 138/70 | HR 103 | Temp 97.8°F | Ht 63.0 in | Wt 156.0 lb

## 2022-05-15 DIAGNOSIS — J0100 Acute maxillary sinusitis, unspecified: Secondary | ICD-10-CM | POA: Diagnosis not present

## 2022-05-15 MED ORDER — AMOXICILLIN-POT CLAVULANATE 875-125 MG PO TABS: 1.0000 | ORAL_TABLET | Freq: Two times a day (BID) | ORAL | 0 refills | Status: DC

## 2022-05-15 NOTE — Progress Notes (Signed)
Subjective:  Patient ID: Christine Valdez, female    DOB: 08/09/1950  Age: 72 y.o. MRN: [id number]  CC:  Chief Complaint  Patient presents with   Sinusitis    Headache, bad taste in her mouth, no energy, glands are sore    HPI Christine Valdez presents for   Sinus congestion:  Started 2  weeks ago with headache, bad tasting postnasal drip, fatigue, sore in glands. Upper teeth now sore past 2 days.  History of sinus infections in past - felt similar, last infection last winter.  Recent travel to [place], home since 04/27/22 New grandson.  No known sick contacts.  Fatigue worse past 3-4 days. No fever.  Slight sore throat past 3-4 days.  Tx: decongestant, flonase, ibuprofen - min relief.  Based on her symptom  History Patient Active Problem List   Diagnosis Date Noted   Genetic testing 04/11/2022   Family history of breast cancer 03/23/2022   Influenza with respiratory manifestation other than pneumonia 05/05/2018   Recurrent major depressive disorder, in partial remission ([place]) 09/28/2016   Hypothyroidism 09/28/2016   B12 deficiency 09/28/2016   Hyperglycemia 09/28/2016   Pruritus ani 06/25/2015   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   Anxiety 02/04/2015   Major depression, recurrent ([place]) 02/04/2015   Mild intermittent asthma without complication 46/27/0350   Osteoarthrosis, unspecified whether generalized or localized, shoulder region 07/04/2013   Past Medical History:  Diagnosis Date   Allergy    uses Flonase daily as needed and takes Loratadine daily    Anemia    only after birth of 2nd child   Arthritis    Asthma    uses Albuterol daily as needed;exercise induced   Cancer ([place]) 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, right cheek   Depression    takes Celexa daily   Depression    [employer]reesia 07/01/2020   GERD (gastroesophageal reflux disease)    occ   History of colon polyps    History of shingles    5-6 yrs   Hyperlipidemia    Hypothyroid    Insomnia     takes Restoril nightly as needed   Interstitial cystitis    Osteoarthritis    Sjogren's syndrome ([place])    Spinal headache    after 1st C/S, o2sat low after shoulder surgery after went to room(51%),hard to urinate)   Thyroid disease    [employer]reesia 09/21/2019   Past Surgical History:  Procedure Laterality Date   CARPAL TUNNEL RELEASE Right 05/2007   CESAREAN SECTION  1981/1984   x 2   COLONOSCOPY     JOINT REPLACEMENT N/A    [employer]reesia 09/21/2019   TONSILLECTOMY AND ADENOIDECTOMY  age 89   TOTAL SHOULDER ARTHROPLASTY Right 07/04/2013   DR NORRIS   TOTAL SHOULDER ARTHROPLASTY Right 07/04/2013   Procedure: RIGHT TOTAL SHOULDER ARTHROPLASTY;  Surgeon: Augustin Schooling, MD;  Location: [facility];  Service: Orthopedics;  Laterality: Right;   TOTAL SHOULDER ARTHROPLASTY Left 03/12/2015   Procedure: LEFT SHOULDER TOTAL SHOULDER ARTHROPLASTY;  Surgeon: Netta Cedars, MD;  Location: [facility];  Service: Orthopedics;  Laterality: Left;   Allergies  Allergen Reactions   Diflucan [Fluconazole] Hives    unknown   Thimerosal (Thiomersal) Hives   Mercury Detox [Nutritional Supplements] Hives   Influenza Vaccine Recombinant Rash    Mercury component and Thierosal in flu vaccine   Levaquin [Levofloxacin] Rash   Sulfa Antibiotics Rash   Prior to Admission medications   Medication Sig Start Date End Date Taking? Authorizing  Provider  albuterol (PROAIR HFA) 108 (90 Base) MCG/ACT inhaler INHALE 2 PUFFS INTO THE LUNGS EVERY 6 (SIX) HOURS AS NEEDED FOR SHORTNESS OF BREATH. 01/20/22   Wendie Agreste, MD  cholecalciferol (VITAMIN D) 1000 UNITS tablet Take 1,000 Units by mouth daily.    [provider]  citalopram (CELEXA) 40 MG tablet Take 1 tablet (40 mg total) by mouth daily. 12/19/21   Megan Salon, MD  docusate sodium (COLACE) 100 MG capsule Take 400 mg by mouth at bedtime.     [provider]  fluticasone (FLONASE) 50 MCG/ACT nasal spray USE 1-2 SPRAYS INTO EACH NOSTRIL ONCE A DAY AS NEEDED  06/05/19   Forrest Moron, MD  levothyroxine (SYNTHROID) 75 MCG tablet Take 1 tablet (75 mcg total) by mouth daily. 07/04/21   Wendie Agreste, MD  loratadine (CLARITIN) 10 MG tablet Take 10 mg by mouth daily as needed for allergies.     [provider]  meloxicam (MOBIC) 15 MG tablet TAKE 1 TABLET (15 MG TOTAL) BY MOUTH DAILY. 05/31/21   Lorenda Peck, DPM  temazepam (RESTORIL) 30 MG capsule TAKE 1 CAPSULE BY MOUTH EVERY DAY AT BEDTIME AS NEEDED 01/20/22   Wendie Agreste, MD  vitamin B-12 (CYANOCOBALAMIN) 1000 MCG tablet Take 1,000 mcg by mouth every other day.     [provider]   Social History   Socioeconomic History   Marital status: Married    Spouse name: Not on file   Number of children: 2   Years of education: Not on file   Highest education level: Not on file  Occupational History   Not on file  Tobacco Use   Smoking status: Never   Smokeless tobacco: Never  Vaping Use   Vaping Use: Never used  Substance and Sexual Activity   Alcohol use: Yes    Alcohol/week: 2.0 standard drinks of alcohol    Types: 2 Glasses of wine per week   Drug use: No   Sexual activity: Not Currently    Partners: Male    Birth control/protection: Post-menopausal  Other Topics Concern   Not on file  Social History Narrative   Work or School: nanny for multiple families      Home Situation: son a Theme park manager      Spiritual Beliefs: Christian      Lifestyle: no regular exercise, diet not great   Social Determinants of Health   Financial Resource Strain: Not on file  Food Insecurity: Not on file  Transportation Needs: Not on file  Physical Activity: Not on file  Stress: Not on file  Social Connections: Not on file  Intimate Partner Violence: Not on file    Review of Systems Per  [facility]  Objective:   Vitals:   05/15/22 1627  BP: 138/70  Pulse: (!) 103  Temp: 97.8 F (36.6 C)  TempSrc: Temporal  SpO2: 97%  Weight: 156 lb (70.8 kg)  Height: '5\' 3"'$  (1.6 m)     Physical Exam Vitals reviewed.  Constitutional:      General: She is not in acute distress.    Appearance: She is well-developed. She is not toxic-appearing or diaphoretic.  HENT:     Head: Normocephalic and atraumatic.     Right Ear: Hearing, tympanic membrane, ear canal and external ear normal.     Left Ear: Hearing, tympanic membrane, ear canal and external ear normal.     Ears:     Comments: Some discomfort below pinna bilaterally but mastoids  nontender, no appreciable lymphadenopathy.    Nose: Nose normal. No congestion.     Comments: Maxillary sinus tenderness to palpation bilaterally.    Mouth/Throat:     Mouth: Mucous membranes are moist.     Pharynx: No posterior oropharyngeal erythema.  Eyes:     Conjunctiva/sclera: Conjunctivae normal.     Pupils: Pupils are equal, round, and reactive to light.  Cardiovascular:     Rate and Rhythm: Normal rate and regular rhythm.     Heart sounds: Normal heart sounds. No murmur heard. Pulmonary:     Effort: Pulmonary effort is normal. No respiratory distress.     Breath sounds: Normal breath sounds. No wheezing or rhonchi.  Skin:    General: Skin is warm and dry.     Findings: No rash.  Neurological:     Mental Status: She is alert and oriented to person, place, and time.  Psychiatric:        Mood and Affect: Mood normal.        Behavior: Behavior normal.        Assessment & Plan:  Christine Valdez is a 72 y.o. female . Acute maxillary sinusitis, recurrence not specified - Plan: amoxicillin-clavulanate (AUGMENTIN) 875-125 MG tablet Based on location of pain in face, upper teeth pain, suspected maxillary sinusitis, acute.  Posterior oropharynx reassuring as was exam of ears.  Start Augmentin, saline nasal spray, fluids, potential side effects and risks of antibiotics discussed.  RTC precautions. Although less likely, COVID testing discussed as some worsening of symptoms past 3 days.  She will advise me of  results.  Meds ordered this encounter  Medications   amoxicillin-clavulanate (AUGMENTIN) 875-125 MG tablet    Sig: Take 1 tablet by mouth 2 (two) times daily.    Dispense:  20 tablet    Refill:  0   Patient Instructions  I do suspect you have a maxillary sinus infection.  Start Augmentin, probiotic or yogurt may help with some of the side effects from the antibiotic.  Salt water nasal spray, drink plenty of fluids, see other information below.  Okay to check a COVID test with some of the worsening symptoms past few days, but suspect that will be negative.  If it is positive, again may not have too much different to offer but let us know and we can talk about other treatment options if needed.  Drink plenty of fluids, rest, and if you are not improving as the week goes on or any worsening symptoms, please be seen.  Hope you feel better soon.   Sinus Infection, Adult A sinus infection, also called sinusitis, is inflammation of your sinuses. Sinuses are hollow spaces in the bones around your face. Your sinuses are located: Around your eyes. In the middle of your forehead. Behind your nose. In your cheekbones. Mucus normally drains out of your sinuses. When your nasal tissues become inflamed or swollen, mucus can become trapped or blocked. This allows bacteria, viruses, and fungi to grow, which leads to infection. Most infections of the sinuses are caused by a virus. A sinus infection can develop quickly. It can last for up to 4 weeks (acute) or for more than 12 weeks (chronic). A sinus infection often develops after a cold. What are the causes? This condition is caused by anything that creates swelling in the sinuses or stops mucus from draining. This includes: Allergies. Asthma. Infection from bacteria or viruses. Deformities or blockages in your nose or sinuses. Abnormal growths in the nose (  nasal polyps). Pollutants, such as chemicals or irritants in the air. Infection from fungi. This is  rare. What increases the risk? You are more likely to develop this condition if you: Have a weak body defense system (immune system). Do a lot of swimming or diving. Overuse nasal sprays. Smoke. What are the signs or symptoms? The main symptoms of this condition are pain and a feeling of pressure around the affected sinuses. Other symptoms include: Stuffy nose or congestion that makes it difficult to breathe through your nose. Thick yellow or greenish drainage from your nose. Tenderness, swelling, and warmth over the affected sinuses. A cough that may get worse at night. Decreased sense of smell and taste. Extra mucus that collects in the throat or the back of the nose (postnasal drip) causing a sore throat or bad breath. Tiredness (fatigue). Fever. How is this diagnosed? This condition is diagnosed based on: Your symptoms. Your medical history. A physical exam. Tests to find out if your condition is acute or chronic. This may include: Checking your nose for nasal polyps. Viewing your sinuses using a device that has a light (endoscope). Testing for allergies or bacteria. Imaging tests, such as an MRI or CT scan. In rare cases, a bone biopsy may be done to rule out more serious types of fungal sinus disease. How is this treated? Treatment for a sinus infection depends on the cause and whether your condition is chronic or acute. If caused by a virus, your symptoms should go away on their own within 10 days. You may be given medicines to relieve symptoms. They include: Medicines that shrink swollen nasal passages (decongestants). A spray that eases inflammation of the nostrils (topical intranasal corticosteroids). Rinses that help get rid of thick mucus in your nose (nasal saline washes). Medicines that treat allergies (antihistamines). Over-the-counter pain relievers. If caused by bacteria, your health care provider may recommend waiting to see if your symptoms improve. Most  bacterial infections will get better without antibiotic medicine. You may be given antibiotics if you have: A severe infection. A weak immune system. If caused by narrow nasal passages or nasal polyps, surgery may be needed. Follow these instructions at home: Medicines Take, use, or apply over-the-counter and prescription medicines only as told by your health care provider. These may include nasal sprays. If you were prescribed an antibiotic medicine, take it as told by your health care provider. Do not stop taking the antibiotic even if you start to feel better. Hydrate and humidify  Drink enough fluid to keep your urine pale yellow. Staying hydrated will help to thin your mucus. Use a cool mist humidifier to keep the humidity level in your home above 50%. Inhale steam for 10-15 minutes, 3-4 times a day, or as told by your health care provider. You can do this in the bathroom while a hot shower is running. Limit your exposure to cool or dry air. Rest Rest as much as possible. Sleep with your head raised (elevated). Make sure you get enough sleep each night. General instructions  Apply a warm, moist washcloth to your face 3-4 times a day or as told by your health care provider. This will help with discomfort. Use nasal saline washes as often as told by your health care provider. Wash your hands often with soap and water to reduce your exposure to germs. If soap and water are not available, use hand sanitizer. Do not smoke. Avoid being around people who are smoking (secondhand smoke). Keep all follow-up visits.  This is important. Contact a health care provider if: You have a fever. Your symptoms get worse. Your symptoms do not improve within 10 days. Get help right away if: You have a severe headache. You have persistent vomiting. You have severe pain or swelling around your face or eyes. You have vision problems. You develop confusion. Your neck is stiff. You have trouble  breathing. These symptoms may be an emergency. Get help right away. Call 911. Do not wait to see if the symptoms will go away. Do not drive yourself to the hospital. Summary A sinus infection is soreness and inflammation of your sinuses. Sinuses are hollow spaces in the bones around your face. This condition is caused by nasal tissues that become inflamed or swollen. The swelling traps or blocks the flow of mucus. This allows bacteria, viruses, and fungi to grow, which leads to infection. If you were prescribed an antibiotic medicine, take it as told by your health care provider. Do not stop taking the antibiotic even if you start to feel better. Keep all follow-up visits. This is important. This information is not intended to replace advice given to you by your health care provider. Make sure you discuss any questions you have with your health care provider. Document Revised: 03/01/2021 Document Reviewed: 03/01/2021 Elsevier Patient Education  2023 [place],   Merri Ray, MD [place], [facility] 05/15/22 9:44 PM

## 2022-05-15 NOTE — Patient Instructions (Addendum)
I do suspect you have a maxillary sinus infection.  Start Augmentin, probiotic or yogurt may help with some of the side effects from the antibiotic.  Salt water nasal spray, drink plenty of fluids, see other information below.  Okay to check a COVID test with some of the worsening symptoms past few days, but suspect that will be negative.  If it is positive, again may not have too much different to offer but let us know and we can talk about other treatment options if needed.  Drink plenty of fluids, rest, and if you are not improving as the week goes on or any worsening symptoms, please be seen.  Hope you feel better soon.   Sinus Infection, Adult A sinus infection, also called sinusitis, is inflammation of your sinuses. Sinuses are hollow spaces in the bones around your face. Your sinuses are located: Around your eyes. In the middle of your forehead. Behind your nose. In your cheekbones. Mucus normally drains out of your sinuses. When your nasal tissues become inflamed or swollen, mucus can become trapped or blocked. This allows bacteria, viruses, and fungi to grow, which leads to infection. Most infections of the sinuses are caused by a virus. A sinus infection can develop quickly. It can last for up to 4 weeks (acute) or for more than 12 weeks (chronic). A sinus infection often develops after a cold. What are the causes? This condition is caused by anything that creates swelling in the sinuses or stops mucus from draining. This includes: Allergies. Asthma. Infection from bacteria or viruses. Deformities or blockages in your nose or sinuses. Abnormal growths in the nose (nasal polyps). Pollutants, such as chemicals or irritants in the air. Infection from fungi. This is rare. What increases the risk? You are more likely to develop this condition if you: Have a weak body defense system (immune system). Do a lot of swimming or diving. Overuse nasal sprays. Smoke. What are the signs or  symptoms? The main symptoms of this condition are pain and a feeling of pressure around the affected sinuses. Other symptoms include: Stuffy nose or congestion that makes it difficult to breathe through your nose. Thick yellow or greenish drainage from your nose. Tenderness, swelling, and warmth over the affected sinuses. A cough that may get worse at night. Decreased sense of smell and taste. Extra mucus that collects in the throat or the back of the nose (postnasal drip) causing a sore throat or bad breath. Tiredness (fatigue). Fever. How is this diagnosed? This condition is diagnosed based on: Your symptoms. Your medical history. A physical exam. Tests to find out if your condition is acute or chronic. This may include: Checking your nose for nasal polyps. Viewing your sinuses using a device that has a light (endoscope). Testing for allergies or bacteria. Imaging tests, such as an MRI or CT scan. In rare cases, a bone biopsy may be done to rule out more serious types of fungal sinus disease. How is this treated? Treatment for a sinus infection depends on the cause and whether your condition is chronic or acute. If caused by a virus, your symptoms should go away on their own within 10 days. You may be given medicines to relieve symptoms. They include: Medicines that shrink swollen nasal passages (decongestants). A spray that eases inflammation of the nostrils (topical intranasal corticosteroids). Rinses that help get rid of thick mucus in your nose (nasal saline washes). Medicines that treat allergies (antihistamines). Over-the-counter pain relievers. If caused by bacteria, your health  care provider may recommend waiting to see if your symptoms improve. Most bacterial infections will get better without antibiotic medicine. You may be given antibiotics if you have: A severe infection. A weak immune system. If caused by narrow nasal passages or nasal polyps, surgery may be  needed. Follow these instructions at home: Medicines Take, use, or apply over-the-counter and prescription medicines only as told by your health care provider. These may include nasal sprays. If you were prescribed an antibiotic medicine, take it as told by your health care provider. Do not stop taking the antibiotic even if you start to feel better. Hydrate and humidify  Drink enough fluid to keep your urine pale yellow. Staying hydrated will help to thin your mucus. Use a cool mist humidifier to keep the humidity level in your home above 50%. Inhale steam for 10-15 minutes, 3-4 times a day, or as told by your health care provider. You can do this in the bathroom while a hot shower is running. Limit your exposure to cool or dry air. Rest Rest as much as possible. Sleep with your head raised (elevated). Make sure you get enough sleep each night. General instructions  Apply a warm, moist washcloth to your face 3-4 times a day or as told by your health care provider. This will help with discomfort. Use nasal saline washes as often as told by your health care provider. Wash your hands often with soap and water to reduce your exposure to germs. If soap and water are not available, use hand sanitizer. Do not smoke. Avoid being around people who are smoking (secondhand smoke). Keep all follow-up visits. This is important. Contact a health care provider if: You have a fever. Your symptoms get worse. Your symptoms do not improve within 10 days. Get help right away if: You have a severe headache. You have persistent vomiting. You have severe pain or swelling around your face or eyes. You have vision problems. You develop confusion. Your neck is stiff. You have trouble breathing. These symptoms may be an emergency. Get help right away. Call 911. Do not wait to see if the symptoms will go away. Do not drive yourself to the hospital. Summary A sinus infection is soreness and inflammation of  your sinuses. Sinuses are hollow spaces in the bones around your face. This condition is caused by nasal tissues that become inflamed or swollen. The swelling traps or blocks the flow of mucus. This allows bacteria, viruses, and fungi to grow, which leads to infection. If you were prescribed an antibiotic medicine, take it as told by your health care provider. Do not stop taking the antibiotic even if you start to feel better. Keep all follow-up visits. This is important. This information is not intended to replace advice given to you by your health care provider. Make sure you discuss any questions you have with your health care provider. Document Revised: 03/01/2021 Document Reviewed: 03/01/2021 Elsevier Patient Education  [place].

## 2022-05-16 ENCOUNTER — Encounter: Payer: Self-pay | Admitting: Family Medicine

## 2022-05-17 NOTE — Telephone Encounter (Signed)
Pt is negative and sent a thank you.

## 2022-05-22 ENCOUNTER — Other Ambulatory Visit: Payer: Self-pay | Admitting: Family Medicine

## 2022-05-22 DIAGNOSIS — J4520 Mild intermittent asthma, uncomplicated: Secondary | ICD-10-CM

## 2022-05-22 DIAGNOSIS — G4700 Insomnia, unspecified: Secondary | ICD-10-CM

## 2022-05-22 DIAGNOSIS — E039 Hypothyroidism, unspecified: Secondary | ICD-10-CM

## 2022-05-23 NOTE — Telephone Encounter (Signed)
Medication discussed last March.  Controlled substance database reviewed.  Last temazepam prescription for #30 on 01/20/2022.  Refilled.

## 2022-05-23 NOTE — Telephone Encounter (Signed)
Temazepam 30  mg LOV: 05/16/22 Last Refill:01/20/22 Upcoming appt: no apt

## 2022-07-06 ENCOUNTER — Telehealth: Payer: Self-pay | Admitting: Family Medicine

## 2022-07-06 NOTE — Telephone Encounter (Signed)
Contacted Kariss Rupar to schedule their annual wellness visit. Patient declined to schedule AWV at this time.  Thank you,  [place] Direct dial  [phone]

## 2022-07-10 ENCOUNTER — Encounter: Payer: Self-pay | Admitting: Adult Health

## 2022-07-10 ENCOUNTER — Ambulatory Visit: Payer: Medicare HMO | Admitting: Adult Health

## 2022-07-10 VITALS — BP 120/70 | HR 83 | Ht 63.5 in | Wt 157.0 lb

## 2022-07-10 DIAGNOSIS — F422 Mixed obsessional thoughts and acts: Secondary | ICD-10-CM

## 2022-07-10 DIAGNOSIS — F330 Major depressive disorder, recurrent, mild: Secondary | ICD-10-CM | POA: Diagnosis not present

## 2022-07-10 DIAGNOSIS — F411 Generalized anxiety disorder: Secondary | ICD-10-CM

## 2022-07-10 MED ORDER — SERTRALINE HCL 50 MG PO TABS: 50.0000 mg | ORAL_TABLET | Freq: Two times a day (BID) | ORAL | 2 refills | Status: DC

## 2022-07-10 NOTE — Progress Notes (Signed)
[facility] MD/PA/NP Initial Note  07/10/2022 12:55 PM Carrieanne Berardino  MRN:  [id number]  Chief Complaint:   HPI:   Patient seen today for initial psychiatric evaluation.   Referred by OB-GYN   Describes mood today as "ok". Pleasant. Tearful at times. Mood symptoms - reports depression and anxiety. Denies irritability. Reports worry, rumination, and over thinking. Reports obsessive thoughts and acts. Reports increased picking behaviors. Feels like she is dealing with a lot of stuff. Reports situational stressors. Stating "I haven't been doing too well". Has taken Celexa 40mg  daily for several years and feels like it has been helpful, but feels like current symptoms are getting worse. Stable interest and motivation. Taking medications as prescribed.  Energy levels stable. Active, has a regular exercise routine - [employer] - walking.   Enjoys some usual interests and activities. Married x 52 years this August. Lives with husband and 3 dogs. Has one daughter (30) and 1 grandchildren. Has one son - married - grandson - [place]. Has a brother with mental illness. Spending time with family. Appetite adequate. Weight gain - 157 pounds. Sleeps well most nights. Averages 6 to 7 hours. Focus and concentration stable. Completing tasks. Managing aspects of household. Retired from [employer]. Denies SI or HI.  Denies AH or VH. Denies self harm. Denies substance use.  Reports osteoarthritis - hip replacement - 2 shoulder replacements.   Previous medication trials: Denies.  Visit Diagnosis: No diagnosis found.  Past Psychiatric History: Family history of psychiatric hospitalization - Brother.   Past Medical History:  Past Medical History:  Diagnosis Date   Allergy    uses Flonase daily as needed and takes Loratadine daily    Anemia    only after birth of 2nd child   Arthritis    Asthma    uses Albuterol daily as needed;exercise induced   Cancer ([place]) 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, right  cheek   Depression    takes Celexa daily   Depression    Phreesia 07/01/2020   GERD (gastroesophageal reflux disease)    occ   History of colon polyps    History of shingles    5-6 yrs   Hyperlipidemia    Hypothyroid    Insomnia    takes Restoril nightly as needed   Interstitial cystitis    Osteoarthritis    Sjogren's syndrome ([place])    Spinal headache    after 1st C/S, o2sat low after shoulder surgery after went to room(51%),hard to urinate)   Thyroid disease    Phreesia 09/21/2019    Past Surgical History:  Procedure Laterality Date   CARPAL TUNNEL RELEASE Right 05/2007   CESAREAN SECTION  1981/1984   x 2   COLONOSCOPY     JOINT REPLACEMENT N/A    Phreesia 09/21/2019   TONSILLECTOMY AND ADENOIDECTOMY  age 41   TOTAL SHOULDER ARTHROPLASTY Right 07/04/2013   DR NORRIS   TOTAL SHOULDER ARTHROPLASTY Right 07/04/2013   Procedure: RIGHT TOTAL SHOULDER ARTHROPLASTY;  Surgeon: Augustin Schooling, MD;  Location: [facility];  Service: Orthopedics;  Laterality: Right;   TOTAL SHOULDER ARTHROPLASTY Left 03/12/2015   Procedure: LEFT SHOULDER TOTAL SHOULDER ARTHROPLASTY;  Surgeon: Netta Cedars, MD;  Location: [facility];  Service: Orthopedics;  Laterality: Left;    Family Psychiatric History: Denies any family history of mental illness.   Family History:  Family History  Problem Relation Age of Onset   Hypertension Mother    Osteoporosis Mother    Diabetes Father  Hypertension Father    Breast cancer Sister 2       stage 4, chemo and radiation   Esophageal cancer Sister        breast cancer met to esophagus   Diabetes Brother    Lung cancer Paternal Aunt    Lung cancer Paternal Aunt    Brain cancer Paternal Uncle    Epilepsy Daughter     Social History:  Social History   Socioeconomic History   Marital status: Married    Spouse name: Not on file   Number of children: 2   Years of education: Not on file   Highest education level: Not on file  Occupational History   Not on  file  Tobacco Use   Smoking status: Never   Smokeless tobacco: Never  Vaping Use   Vaping Use: Never used  Substance and Sexual Activity   Alcohol use: Yes    Alcohol/week: 2.0 standard drinks of alcohol    Types: 2 Glasses of wine per week   Drug use: No   Sexual activity: Not Currently    Partners: Male    Birth control/protection: Post-menopausal  Other Topics Concern   Not on file  Social History Narrative   Work or School: nanny for multiple families      Home Situation: son a Theme park manager      Spiritual Beliefs: Christian      Lifestyle: no regular exercise, diet not great   Social Determinants of Health   Financial Resource Strain: Not on file  Food Insecurity: Not on file  Transportation Needs: Not on file  Physical Activity: Not on file  Stress: Not on file  Social Connections: Not on file    Allergies:  Allergies  Allergen Reactions   Diflucan [Fluconazole] Hives    unknown   Thimerosal (Thiomersal) Hives   Mercury Detox [Nutritional Supplements] Hives   Influenza Vaccine Recombinant Rash    Mercury component and Thierosal in flu vaccine   Levaquin [Levofloxacin] Rash   Sulfa Antibiotics Rash    Metabolic Disorder Labs: Lab Results  Component Value Date   HGBA1C 6.1 01/19/2022   No results found for: "PROLACTIN" Lab Results  Component Value Date   CHOL 249 (H) 07/04/2021   TRIG 150.0 (H) 07/04/2021   HDL 61.80 07/04/2021   CHOLHDL 4 07/04/2021   VLDL 30.0 07/04/2021   LDLCALC 157 (H) 07/04/2021   LDLCALC 140 (H) 12/23/2020   Lab Results  Component Value Date   TSH 1.27 01/19/2022   TSH 0.53 07/04/2021    Therapeutic Level Labs: No results found for: "LITHIUM" No results found for: "VALPROATE" No results found for: "CBMZ"  Current Medications: Current Outpatient Medications  Medication Sig Dispense Refill   albuterol (VENTOLIN HFA) 108 (90 Base) MCG/ACT inhaler INHALE 2 PUFFS INTO THE LUNGS EVERY 6 HOURS AS NEEDED FOR SHORTNESS OF  BREATH 8.5 each 0   amoxicillin-clavulanate (AUGMENTIN) 875-125 MG tablet Take 1 tablet by mouth 2 (two) times daily. 20 tablet 0   cholecalciferol (VITAMIN D) 1000 UNITS tablet Take 1,000 Units by mouth daily.     citalopram (CELEXA) 40 MG tablet Take 1 tablet (40 mg total) by mouth daily. 90 tablet 3   docusate sodium (COLACE) 100 MG capsule Take 400 mg by mouth at bedtime.      fluticasone (FLONASE) 50 MCG/ACT nasal spray USE 1-2 SPRAYS INTO EACH NOSTRIL ONCE A DAY AS NEEDED 48 mL 1   hydrOXYzine (ATARAX) 10 MG tablet TAKE 1 TABLET (  10 MG TOTAL) BY MOUTH 3 (THREE) TIMES DAILY AS NEEDED FOR ANXIETY (OR SLEEP). START AT BEDTIME. 30 tablet 0   levothyroxine (SYNTHROID) 75 MCG tablet TAKE 1 TABLET BY MOUTH EVERY DAY 90 tablet 3   loratadine (CLARITIN) 10 MG tablet Take 10 mg by mouth daily as needed for allergies.      meloxicam (MOBIC) 15 MG tablet TAKE 1 TABLET (15 MG TOTAL) BY MOUTH DAILY. 30 tablet 0   temazepam (RESTORIL) 30 MG capsule TAKE 1 CAPSULE BY MOUTH EVERY DAY AT BEDTIME AS NEEDED 30 capsule 0   vitamin B-12 (CYANOCOBALAMIN) 1000 MCG tablet Take 1,000 mcg by mouth every other day.      No current facility-administered medications for this visit.    Medication Side Effects: none  Orders placed this visit:  No orders of the defined types were placed in this encounter.   Psychiatric Specialty Exam:  Review of Systems  Musculoskeletal:  Negative for gait problem.  Neurological:  Negative for tremors.  Psychiatric/Behavioral:         Please refer to HPI    Last menstrual period 04/11/2003.There is no height or weight on file to calculate BMI.  General Appearance: Casual and Neat  Eye Contact:  Good  Speech:  Clear and Coherent and Normal Rate  Volume:  Normal  Mood:  Euthymic  Affect:  Appropriate and Congruent  Thought Process:  Coherent and Descriptions of Associations: Intact  Orientation:  Full (Time, Place, and Person)  Thought Content: Logical   Suicidal Thoughts:   No  Homicidal Thoughts:  No  Memory:  WNL  Judgement:  Good  Insight:  Good  Psychomotor Activity:  Normal  Concentration:  Concentration: Good and Attention Span: Good  Recall:  Good  Fund of Knowledge: Good  Language: Good  Assets:  Communication Skills Desire for Improvement Financial Resources/Insurance Housing Intimacy Leisure Time Physical Health Resilience Social Support Talents/Skills Transportation Vocational/Educational  ADL's:  Intact  Cognition: WNL  Prognosis:  Good   Screenings:  PHQ2-9    [place] Office Visit from 01/19/2022 in [facility] at [facility] Visit from 12/19/2021 in [facility] for [facility] at [place] [facility] Visit from 07/04/2021 in [facility] at [facility] Visit from 06/29/2021 in [facility] at [facility] Visit from 05/18/2021 in [facility] at [facility]  PHQ-2 Total Score 0 0 5 6 0  PHQ-9 Total Score 0 -- 13 15 0       Receiving Psychotherapy: No   Treatment Plan/Recommendations:   Plan:  PDMP reviewed  Decrease Celexa 40mg  by 10mg  daily Add Zoloft 50mg  daily x 7 days, then increase to 100mg  daily  RTC 4 weeks  Patient advised to contact office with any questions, adverse effects, or acute worsening in signs and symptoms.    Aloha Gell, NP

## 2022-07-11 ENCOUNTER — Telehealth: Payer: Self-pay | Admitting: Adult Health

## 2022-07-11 NOTE — Telephone Encounter (Signed)
Called Pt with info. LVM

## 2022-07-11 NOTE — Telephone Encounter (Signed)
Pt left voice mail asking what the OTC med you mentioned yesterday. Started with "n" Pt stated.

## 2022-07-11 NOTE — Telephone Encounter (Signed)
NAC tabs.

## 2022-07-13 ENCOUNTER — Telehealth: Payer: Self-pay | Admitting: Adult Health

## 2022-07-13 NOTE — Telephone Encounter (Signed)
Pt was seen on Monday. She has a question about her medications. She wants to make sure she is taking them the correct way. Please call her at 336 [phone]

## 2022-07-13 NOTE — Telephone Encounter (Signed)
Reviewed instructions from [facility].

## 2022-08-01 ENCOUNTER — Other Ambulatory Visit: Payer: Self-pay | Admitting: Adult Health

## 2022-08-01 DIAGNOSIS — K5900 Constipation, unspecified: Secondary | ICD-10-CM | POA: Diagnosis not present

## 2022-08-01 DIAGNOSIS — F422 Mixed obsessional thoughts and acts: Secondary | ICD-10-CM

## 2022-08-01 DIAGNOSIS — F411 Generalized anxiety disorder: Secondary | ICD-10-CM

## 2022-08-01 DIAGNOSIS — R69 Illness, unspecified: Secondary | ICD-10-CM | POA: Diagnosis not present

## 2022-08-01 DIAGNOSIS — Z86010 Personal history of colonic polyps: Secondary | ICD-10-CM | POA: Diagnosis not present

## 2022-08-01 DIAGNOSIS — F330 Major depressive disorder, recurrent, mild: Secondary | ICD-10-CM

## 2022-08-01 DIAGNOSIS — E039 Hypothyroidism, unspecified: Secondary | ICD-10-CM | POA: Diagnosis not present

## 2022-08-01 DIAGNOSIS — Z1211 Encounter for screening for malignant neoplasm of colon: Secondary | ICD-10-CM | POA: Diagnosis not present

## 2022-08-07 ENCOUNTER — Ambulatory Visit: Payer: Medicare HMO | Admitting: Adult Health

## 2022-08-07 ENCOUNTER — Encounter: Payer: Self-pay | Admitting: Adult Health

## 2022-08-07 DIAGNOSIS — F330 Major depressive disorder, recurrent, mild: Secondary | ICD-10-CM

## 2022-08-07 DIAGNOSIS — F422 Mixed obsessional thoughts and acts: Secondary | ICD-10-CM

## 2022-08-07 DIAGNOSIS — F411 Generalized anxiety disorder: Secondary | ICD-10-CM | POA: Diagnosis not present

## 2022-08-07 MED ORDER — LORAZEPAM 0.5 MG PO TABS: 0.5000 mg | ORAL_TABLET | Freq: Three times a day (TID) | ORAL | 0 refills | Status: DC

## 2022-08-07 MED ORDER — SERTRALINE HCL 50 MG PO TABS: 50.0000 mg | ORAL_TABLET | Freq: Two times a day (BID) | ORAL | 0 refills | Status: DC

## 2022-08-07 NOTE — Progress Notes (Signed)
Christine Valdez [id number] [birth year] 72 y.o.  Subjective:   Patient ID:  Christine Valdez is a 72 y.o. (DOB [birth year]) female.  Chief Complaint: No chief complaint on file.   HPI Summar Christine Valdez presents to the office today for follow-up of Referred by OB-GYN   Describes mood today as "ok". Pleasant. Tearful at times. Mood symptoms - reports decreased depression and anxiety. Denies irritability. Reports worry, rumination, and over thinking. Reports obsessive thoughts and acts. Reports increased picking behaviors. Reports situational stressors. Has tolerated the change from Celexa to the Zoloft and feel like it's been helpful. Stating "I feel like I'm doing better". Stable interest and motivation. Taking medications as prescribed.  Energy levels stable. Active, has a regular exercise routine - [employer] - walking.   Enjoys some usual interests and activities. Married x 52 years this August. Lives with husband and 3 dogs. Has one daughter (79) and 1 grandchildren. Has one son - married - grandson - [place].  Spending time with family. Appetite adequate. Weight gain - 157 pounds. Sleeps well most nights. Averages 6 to 7 hours. Focus and concentration stable. Completing tasks. Managing aspects of household. Retired from [employer]. Denies SI or HI.  Denies AH or VH. Denies self harm. Denies substance use.  Reports osteoarthritis - hip replacement - 2 shoulder replacements.   Previous medication trials: Denies.   PHQ2-9    Flowsheet Row Office Visit from 01/19/2022 in [facility] Visit from 12/19/2021 in [facility] for [facility] Office Visit from 07/04/2021 in [facility] Visit from 06/29/2021 in [facility] Visit from 05/18/2021 in [facility] at [facility] Total Score 0 0 5 6 0  PHQ-9 Total Score 0 -- 13 15 0        Review of Systems:  Review of Systems  Musculoskeletal:  Negative for gait problem.  Neurological:  Negative for tremors.  Psychiatric/Behavioral:         Please refer to HPI    Medications: I have reviewed the patient's current medications.  Current Outpatient Medications  Medication Sig Dispense Refill   LORazepam (ATIVAN) 0.5 MG tablet Take 1 tablet (0.5 mg total) by mouth every 8 (eight) hours. 15 tablet 0   albuterol (VENTOLIN HFA) 108 (90 Base) MCG/ACT inhaler INHALE 2 PUFFS INTO THE LUNGS EVERY 6 HOURS AS NEEDED FOR SHORTNESS OF BREATH 8.5 each 0   amoxicillin-clavulanate (AUGMENTIN) 875-125 MG tablet Take 1 tablet by mouth 2 (two) times daily. 20 tablet 0   cholecalciferol (VITAMIN D) 1000 UNITS tablet Take 1,000 Units by mouth daily.     citalopram (CELEXA) 40 MG tablet Take 1 tablet (40 mg total) by mouth daily. 90 tablet 3   docusate sodium (COLACE) 100 MG capsule Take 400 mg by mouth at bedtime.      fluticasone (FLONASE) 50 MCG/ACT nasal spray USE 1-2 SPRAYS INTO EACH NOSTRIL ONCE A DAY AS NEEDED 48 mL 1   levothyroxine (SYNTHROID) 75 MCG tablet TAKE 1 TABLET BY MOUTH EVERY DAY 90 tablet 3   loratadine (CLARITIN) 10 MG tablet Take 10 mg by mouth daily as needed for allergies.      sertraline (ZOLOFT) 50 MG tablet Take 1 tablet (50 mg total) by mouth 2 (two) times daily. 180 tablet 0   temazepam (RESTORIL) 30 MG capsule TAKE 1 CAPSULE BY MOUTH  EVERY DAY AT BEDTIME AS NEEDED 30 capsule 0   vitamin B-12 (CYANOCOBALAMIN) 1000 MCG tablet Take 1,000 mcg by mouth every other day.      No current facility-administered medications for this visit.    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, right cheek   Depression    takes Celexa daily   Depression    Phreesia 07/01/2020   GERD (gastroesophageal reflux disease)    occ   History of colon polyps    History of shingles    5-6 yrs   Hyperlipidemia    Hypothyroid    Insomnia    takes Restoril nightly as needed   Interstitial cystitis    Osteoarthritis    Sjogren's syndrome (HCC)    Spinal headache    after 1st C/S, o2sat low after shoulder surgery after went to room(51%),hard to urinate)   Thyroid disease    Phreesia 09/21/2019    Past Medical History, Surgical history, Social history, and Family history were reviewed and updated as appropriate.   Please see review of systems for further details on the patient's review from today.   Objective:   Physical Exam:  LMP 04/11/2003 (Approximate)   Physical Exam Constitutional:      General: Christine Valdez is not in acute distress. Musculoskeletal:        General: No deformity.  Neurological:     Mental Status: Christine Valdez is alert and oriented to person, place, and time.     Coordination: Coordination normal.  Psychiatric:        Attention and Perception: Attention and perception normal. Christine Valdez does not perceive auditory or visual hallucinations.        Mood and Affect: Mood normal. Mood is not anxious or depressed. Affect is not labile, blunt, angry or inappropriate.        Speech: Speech normal.        Behavior: Behavior normal.        Thought Content: Thought content normal. Thought content is not paranoid or delusional. Thought content does not include homicidal or suicidal ideation. Thought content does not include homicidal or  suicidal plan.        Cognition and Memory: Cognition and memory normal.        Judgment: Judgment normal.     Comments: Insight intact     Lab Review:     Component Value Date/Time   NA 135 01/19/2022 1626   NA 136 06/25/2020 0912   K 4.8 01/19/2022 1626   CL 100 01/19/2022 1626   CO2 29 01/19/2022 1626   GLUCOSE 93 01/19/2022 1626   BUN 16 01/19/2022 1626   BUN 20 06/25/2020 0912   CREATININE 0.97 01/19/2022 1626   CALCIUM 10.4 01/19/2022 1626   PROT 6.7 07/04/2021 1527   PROT 6.3 06/25/2020 0912   ALBUMIN 4.5 07/04/2021 1527  ALBUMIN 4.2 06/25/2020 0912   AST 22 07/04/2021 1527   ALT 14 07/04/2021 1527   ALKPHOS 40 07/04/2021 1527   BILITOT 0.6 07/04/2021 1527   BILITOT <0.2 06/25/2020 0912   GFRNONAA 63 11/13/2018 1101   GFRAA 72 11/13/2018 1101       Component Value Date/Time   WBC 5.5 01/19/2022 1626   RBC 4.54 01/19/2022 1626   HGB 13.8 01/19/2022 1626   HCT 41.7 01/19/2022 1626   PLT 176.0 01/19/2022 1626   MCV 91.7 01/19/2022 1626   MCV 87.6 04/30/2017 1237   MCH 29.7 04/30/2017 1237   MCH 30.3 03/02/2015 1435   MCHC 33.2 01/19/2022 1626   RDW 13.4 01/19/2022 1626   LYMPHSABS 1.0 07/04/2021 1527   MONOABS 0.4 07/04/2021 1527   EOSABS 0.1 07/04/2021 1527   BASOSABS 0.0 07/04/2021 1527    No results found for: "POCLITH", "LITHIUM"   No results found for: "PHENYTOIN", "PHENOBARB", "VALPROATE", "CBMZ"   .res Assessment: Plan:    Treatment Plan/Recommendations:   Plan:  PDMP reviewed  Zoloft 100mg  daily - may increase to 150mg  between visits.   RTC 3 months  Patient advised to contact office with any questions, adverse effects, or acute worsening in signs and symptoms.  Diagnoses and all orders for this visit:  Mild episode of recurrent major depressive disorder (HCC) -     sertraline (ZOLOFT) 50 MG tablet; Take 1 tablet (50 mg total) by mouth 2 (two) times daily.  Generalized anxiety disorder -     sertraline (ZOLOFT) 50 MG tablet;  Take 1 tablet (50 mg total) by mouth 2 (two) times daily. -     LORazepam (ATIVAN) 0.5 MG tablet; Take 1 tablet (0.5 mg total) by mouth every 8 (eight) hours.  Mixed obsessional thoughts and acts -     sertraline (ZOLOFT) 50 MG tablet; Take 1 tablet (50 mg total) by mouth 2 (two) times daily.     Please see After Visit Summary for patient specific instructions.  Future Appointments  Date Time Provider Department Center  12/31/2023  2:15 PM Jerene Bears, MD [facility]    No orders of the defined types were placed in this encounter.   -------------------------------

## 2022-08-30 DIAGNOSIS — H2513 Age-related nuclear cataract, bilateral: Secondary | ICD-10-CM | POA: Diagnosis not present

## 2022-08-30 DIAGNOSIS — H40013 Open angle with borderline findings, low risk, bilateral: Secondary | ICD-10-CM | POA: Diagnosis not present

## 2022-08-30 DIAGNOSIS — H02834 Dermatochalasis of left upper eyelid: Secondary | ICD-10-CM | POA: Diagnosis not present

## 2022-08-30 DIAGNOSIS — H04123 Dry eye syndrome of bilateral lacrimal glands: Secondary | ICD-10-CM | POA: Diagnosis not present

## 2022-08-30 DIAGNOSIS — H02831 Dermatochalasis of right upper eyelid: Secondary | ICD-10-CM | POA: Diagnosis not present

## 2022-08-30 DIAGNOSIS — H1045 Other chronic allergic conjunctivitis: Secondary | ICD-10-CM | POA: Diagnosis not present

## 2022-09-09 ENCOUNTER — Other Ambulatory Visit: Payer: Self-pay | Admitting: Family Medicine

## 2022-09-11 NOTE — Telephone Encounter (Signed)
Temazepam 30 mg LOV: 05/15/22 Last Refill:05/23/22 Upcoming appt: none

## 2022-09-11 NOTE — Telephone Encounter (Signed)
Controlled substance database reviewed.  Lorazepam 0.5 mg #15 on 08/07/2022, previously temazepam 30 mg on 05/23/2022, #30.  I will refill temazepam at this time as infrequent use based on controlled substance database review. However future refills may need to be discussed with behavioral health, as it appears she is under their care with office visit April 1 and 29th.

## 2022-09-25 DIAGNOSIS — D125 Benign neoplasm of sigmoid colon: Secondary | ICD-10-CM | POA: Diagnosis not present

## 2022-09-25 DIAGNOSIS — Z86010 Personal history of colonic polyps: Secondary | ICD-10-CM | POA: Diagnosis not present

## 2022-09-25 DIAGNOSIS — Z1211 Encounter for screening for malignant neoplasm of colon: Secondary | ICD-10-CM | POA: Diagnosis not present

## 2022-09-25 DIAGNOSIS — K635 Polyp of colon: Secondary | ICD-10-CM | POA: Diagnosis not present

## 2022-10-05 DIAGNOSIS — H5203 Hypermetropia, bilateral: Secondary | ICD-10-CM | POA: Diagnosis not present

## 2022-10-05 DIAGNOSIS — H5213 Myopia, bilateral: Secondary | ICD-10-CM | POA: Diagnosis not present

## 2022-10-05 DIAGNOSIS — H52209 Unspecified astigmatism, unspecified eye: Secondary | ICD-10-CM | POA: Diagnosis not present

## 2022-10-05 DIAGNOSIS — H524 Presbyopia: Secondary | ICD-10-CM | POA: Diagnosis not present

## 2022-11-06 ENCOUNTER — Ambulatory Visit: Payer: Medicare HMO | Admitting: Adult Health

## 2022-11-09 ENCOUNTER — Encounter: Payer: Self-pay | Admitting: Adult Health

## 2022-11-09 ENCOUNTER — Ambulatory Visit: Payer: Medicare HMO | Admitting: Adult Health

## 2022-11-09 DIAGNOSIS — F411 Generalized anxiety disorder: Secondary | ICD-10-CM | POA: Diagnosis not present

## 2022-11-09 DIAGNOSIS — F330 Major depressive disorder, recurrent, mild: Secondary | ICD-10-CM | POA: Diagnosis not present

## 2022-11-09 DIAGNOSIS — F422 Mixed obsessional thoughts and acts: Secondary | ICD-10-CM | POA: Diagnosis not present

## 2022-11-09 MED ORDER — LORAZEPAM 0.5 MG PO TABS: 0.5000 mg | ORAL_TABLET | Freq: Three times a day (TID) | ORAL | 0 refills | Status: DC

## 2022-11-09 MED ORDER — SERTRALINE HCL 100 MG PO TABS: ORAL_TABLET | ORAL | 3 refills | Status: DC

## 2022-11-09 NOTE — Addendum Note (Signed)
Addended by: Dorothyann Gibbs on: 11/09/2022 03:56 PM   Modules accepted: Orders

## 2022-11-09 NOTE — Progress Notes (Addendum)
Christine Valdez [id number] 10/19/1950 72 y.o.  Subjective:   Patient ID:  Christine Valdez is a 72 y.o. (DOB 09/24/1950) female.  Chief Complaint: No chief complaint on file.   HPI Christine Valdez presents to the office today for follow-up MDD, GAD, and mixed obsessional thoughts and acts.  Describes mood today as "ok". Pleasant. Tearful at times. Mood symptoms - reports decreased depression. Reports anxiety. Denies irritability. Reports worry, rumination, and over thinking. Reports obsessive thoughts and acts. Reports increased picking behaviors. Reports situational stressors - brother. Stating "I feel like I'm doing ok". Stable interest and motivation. Taking medications as prescribed.  Energy levels stable. Active, has a regular exercise routine. Enjoys some usual interests and activities. Married x 52 years this August. Lives with husband and 3 dogs. Has one daughter (36) and 1 grandchildren. Has one son - married - grandson - [place].  Spending time with family. Appetite adequate. Weight gain - 156 pounds. Sleeps well most nights. Averages 6 to 7 hours. Focus and concentration stable. Completing tasks. Managing aspects of household. Retired from [employer]. Denies SI or HI.  Denies AH or VH. Denies self harm. Denies substance use.  Reports osteoarthritis - hip replacement - 2 shoulder replacements.   Previous medication trials: Denies.    PHQ2-9    Flowsheet Row Office Visit from 01/19/2022 in [facility] Visit from 12/19/2021 in [facility] for [facility] Office Visit from 07/04/2021 in [facility] Visit from 06/29/2021 in [facility] Visit from 05/18/2021 in [facility] Total Score 0 0 5 6 0  PHQ-9 Total Score 0 -- 13 15 0         Review of Systems:  Review of Systems  Musculoskeletal:  Negative for gait problem.  Neurological:  Negative for tremors.  Psychiatric/Behavioral:         Please refer to HPI    Medications: I have reviewed the patient's current medications.  Current Outpatient Medications  Medication Sig Dispense Refill   albuterol (VENTOLIN HFA) 108 (90 Base) MCG/ACT inhaler INHALE 2 PUFFS INTO THE LUNGS EVERY 6 HOURS AS NEEDED FOR SHORTNESS OF BREATH 8.5 each 0   amoxicillin-clavulanate (AUGMENTIN) 875-125 MG tablet Take 1 tablet by mouth 2 (two) times daily. 20 tablet 0   cholecalciferol (VITAMIN D) 1000 UNITS tablet Take 1,000 Units by mouth daily.     citalopram (CELEXA) 40 MG tablet Take 1 tablet (40 mg total) by mouth daily. 90 tablet 3   docusate sodium (COLACE) 100 MG capsule Take 400 mg by mouth at bedtime.      fluticasone (FLONASE) 50 MCG/ACT nasal spray USE 1-2 SPRAYS INTO EACH NOSTRIL ONCE A DAY AS NEEDED 48 mL 1   levothyroxine (SYNTHROID) 75 MCG tablet TAKE 1 TABLET BY MOUTH EVERY DAY 90 tablet 3   loratadine (CLARITIN) 10 MG tablet Take 10 mg by mouth daily as needed for allergies.      LORazepam (ATIVAN) 0.5 MG tablet Take 1 tablet (0.5 mg total) by mouth every 8 (eight) hours. 15 tablet 0   sertraline (ZOLOFT) 100 MG tablet Take two tablets daily. 180 tablet 3   temazepam (RESTORIL) 30 MG capsule TAKE 1 CAPSULE BY MOUTH EVERY DAY AT BEDTIME AS NEEDED 30 capsule 0   vitamin B-12 (CYANOCOBALAMIN) 1000 MCG tablet Take 1,000 mcg by mouth every  other day.      No current facility-administered medications for this visit.    Medication Side Effects: None  Allergies:  Allergies  Allergen Reactions   Diflucan [Fluconazole] Hives    unknown   Thimerosal (Thiomersal) Hives   Mercury Detox [Nutritional Supplements] Hives   Influenza Vaccine Recombinant Rash    Mercury component and Thierosal in flu vaccine   Levaquin [Levofloxacin] Rash   Sulfa Antibiotics Rash    Past  Medical History:  Diagnosis Date   Allergy    uses Flonase daily as needed and takes Loratadine daily    Anemia    only after birth of 2nd child   Arthritis    Asthma    uses Albuterol daily as needed;exercise induced   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, right cheek   Depression    takes Celexa daily   Depression    Phreesia 07/01/2020   GERD (gastroesophageal reflux disease)    occ   History of colon polyps    History of shingles    5-6 yrs   Hyperlipidemia    Hypothyroid    Insomnia    takes Restoril nightly as needed   Interstitial cystitis    Osteoarthritis    Sjogren's syndrome (HCC)    Spinal headache    after 1st C/S, o2sat low after shoulder surgery after went to room(51%),hard to urinate)   Thyroid disease    Phreesia 09/21/2019    Past Medical History, Surgical history, Social history, and Family history were reviewed and updated as appropriate.   Please see review of systems for further details on the patient's review from today.   Objective:   Physical Exam:  LMP 04/11/2003 (Approximate)   Physical Exam Constitutional:      General: She is not in acute distress. Musculoskeletal:        General: No deformity.  Neurological:     Mental Status: She is alert and oriented to person, place, and time.     Coordination: Coordination normal.  Psychiatric:        Attention and Perception: Attention and perception normal. She does not perceive auditory or visual hallucinations.        Mood and Affect: Mood normal. Mood is not anxious or depressed. Affect is not labile, blunt, angry or inappropriate.        Speech: Speech normal.        Behavior: Behavior normal.        Thought Content: Thought content normal. Thought content is not paranoid or delusional. Thought content does not include homicidal or suicidal ideation. Thought content does not include homicidal or suicidal plan.        Cognition and Memory: Cognition and memory normal.        Judgment:  Judgment normal.     Comments: Insight intact     Lab Review:     Component Value Date/Time   NA 135 01/19/2022 1626   NA 136 06/25/2020 0912   K 4.8 01/19/2022 1626   CL 100 01/19/2022 1626   CO2 29 01/19/2022 1626   GLUCOSE 93 01/19/2022 1626   BUN 16 01/19/2022 1626   BUN 20 06/25/2020 0912   CREATININE 0.97 01/19/2022 1626   CALCIUM 10.4 01/19/2022 1626   PROT 6.7 07/04/2021 1527   PROT 6.3 06/25/2020 0912   ALBUMIN 4.5 07/04/2021 1527   ALBUMIN 4.2 06/25/2020 0912   AST 22 07/04/2021 1527   ALT 14 07/04/2021 1527   ALKPHOS 40 07/04/2021  1527   BILITOT 0.6 07/04/2021 1527   BILITOT <0.2 06/25/2020 0912   GFRNONAA 63 11/13/2018 1101   GFRAA 72 11/13/2018 1101       Component Value Date/Time   WBC 5.5 01/19/2022 1626   RBC 4.54 01/19/2022 1626   HGB 13.8 01/19/2022 1626   HCT 41.7 01/19/2022 1626   PLT 176.0 01/19/2022 1626   MCV 91.7 01/19/2022 1626   MCV 87.6 04/30/2017 1237   MCH 29.7 04/30/2017 1237   MCH 30.3 03/02/2015 1435   MCHC 33.2 01/19/2022 1626   RDW 13.4 01/19/2022 1626   LYMPHSABS 1.0 07/04/2021 1527   MONOABS 0.4 07/04/2021 1527   EOSABS 0.1 07/04/2021 1527   BASOSABS 0.0 07/04/2021 1527    No results found for: "POCLITH", "LITHIUM"   No results found for: "PHENYTOIN", "PHENOBARB", "VALPROATE", "CBMZ"   .res Assessment: Plan:    Treatment Plan/Recommendations:   Plan:  PDMP reviewed  Zoloft 100mg  to 150mg  daily - may increase to 20mg  between visits. Lorazepam 0.5mg  daily as needed.  NAC tabs  RTC 6 months  Patient advised to contact office with any questions, adverse effects, or acute worsening in signs and symptoms.  Diagnoses and all orders for this visit:  Mild episode of recurrent major depressive disorder (HCC) -     sertraline (ZOLOFT) 100 MG tablet; Take two tablets daily.  Generalized anxiety disorder -     sertraline (ZOLOFT) 100 MG tablet; Take two tablets daily.  Mixed obsessional thoughts and acts -      sertraline (ZOLOFT) 100 MG tablet; Take two tablets daily.     Please see After Visit Summary for patient specific instructions.  Future Appointments  Date Time Provider Department Center  12/31/2023  2:15 PM Jerene Bears, MD [facility]    No orders of the defined types were placed in this encounter.   -------------------------------

## 2023-01-23 ENCOUNTER — Other Ambulatory Visit (HOSPITAL_BASED_OUTPATIENT_CLINIC_OR_DEPARTMENT_OTHER): Payer: Self-pay | Admitting: Obstetrics & Gynecology

## 2023-01-23 ENCOUNTER — Other Ambulatory Visit: Payer: Self-pay | Admitting: Adult Health

## 2023-01-23 ENCOUNTER — Other Ambulatory Visit: Payer: Self-pay | Admitting: Family Medicine

## 2023-01-23 DIAGNOSIS — F411 Generalized anxiety disorder: Secondary | ICD-10-CM

## 2023-01-23 DIAGNOSIS — Z8659 Personal history of other mental and behavioral disorders: Secondary | ICD-10-CM

## 2023-01-24 NOTE — Telephone Encounter (Signed)
Temazepam 30 mg last filled for #30 on 09/11/2022, controlled substance database reviewed.  #5 of lorazepam noted from 11/09/2022.  Otherwise no new listings.  Recent behavioral health note reviewed. Last visit with me in February for acute issue, medication was not discussed at that time.  I do not see an upcoming appointment.  Please schedule visit with me in the next month to review medications including the temazepam but I will refill that temporarily.  Thanks.

## 2023-01-24 NOTE — Telephone Encounter (Signed)
Patient is requesting a refill of the following medications: Requested Prescriptions   Pending Prescriptions Disp Refills   temazepam (RESTORIL) 30 MG capsule [Pharmacy Med Name: TEMAZEPAM 30 MG CAPSULE] 30 capsule 0    Sig: TAKE 1 CAPSULE BY MOUTH EVERY DAY AT BEDTIME AS NEEDED    Date of patient request: 01/24/23 Last office visit: 05/15/22 Date of last refill: 09/11/22 Last refill amount: 30 Follow up time period per chart: PRN

## 2023-01-25 NOTE — Telephone Encounter (Signed)
LMOVM for pt to call regarding refill request

## 2023-01-30 NOTE — Telephone Encounter (Signed)
Called and spoke with patient. Patient states she is no longer on citalopram. She is now taking Zoloft. tbw

## 2023-02-15 DIAGNOSIS — D225 Melanocytic nevi of trunk: Secondary | ICD-10-CM | POA: Diagnosis not present

## 2023-02-15 DIAGNOSIS — L821 Other seborrheic keratosis: Secondary | ICD-10-CM | POA: Diagnosis not present

## 2023-02-15 DIAGNOSIS — L570 Actinic keratosis: Secondary | ICD-10-CM | POA: Diagnosis not present

## 2023-02-15 DIAGNOSIS — Z85828 Personal history of other malignant neoplasm of skin: Secondary | ICD-10-CM | POA: Diagnosis not present

## 2023-02-15 DIAGNOSIS — L2989 Other pruritus: Secondary | ICD-10-CM | POA: Diagnosis not present

## 2023-02-15 DIAGNOSIS — L814 Other melanin hyperpigmentation: Secondary | ICD-10-CM | POA: Diagnosis not present

## 2023-02-15 DIAGNOSIS — Z08 Encounter for follow-up examination after completed treatment for malignant neoplasm: Secondary | ICD-10-CM | POA: Diagnosis not present

## 2023-03-25 ENCOUNTER — Ambulatory Visit
Admission: EM | Admit: 2023-03-25 | Discharge: 2023-03-25 | Disposition: A | Discharge status: Home / Self Care | Payer: Medicare HMO | Attending: Internal Medicine | Admitting: Internal Medicine

## 2023-03-25 DIAGNOSIS — U071 COVID-19: Secondary | ICD-10-CM | POA: Diagnosis not present

## 2023-03-25 DIAGNOSIS — R051 Acute cough: Secondary | ICD-10-CM | POA: Insufficient documentation

## 2023-03-25 DIAGNOSIS — N3000 Acute cystitis without hematuria: Secondary | ICD-10-CM | POA: Diagnosis not present

## 2023-03-25 LAB — POC COVID19/FLU A&B COMBO
Covid Antigen, POC: POSITIVE — AB
Influenza A Antigen, POC: NEGATIVE
Influenza B Antigen, POC: NEGATIVE

## 2023-03-25 LAB — POCT URINALYSIS DIP (MANUAL ENTRY)
Bilirubin, UA: NEGATIVE
Blood, UA: NEGATIVE
Glucose, UA: 100 mg/dL — AB
Ketones, POC UA: NEGATIVE mg/dL
Leukocytes, UA: NEGATIVE
Nitrite, UA: POSITIVE — AB
Protein Ur, POC: NEGATIVE mg/dL
Spec Grav, UA: 1.025 (ref 1.010–1.025)
Urobilinogen, UA: 0.2 U/dL
pH, UA: 6.5 (ref 5.0–8.0)

## 2023-03-25 MED ORDER — CEPHALEXIN 500 MG PO CAPS: 500.0000 mg | ORAL_CAPSULE | Freq: Two times a day (BID) | ORAL | 0 refills | Status: AC

## 2023-03-25 NOTE — Discharge Instructions (Addendum)
The clinic will contact you with results of the urine culture done today if positive.  Start Keflex twice daily for 7 days.  Lots of rest and fluids.  Continue over-the-counter cough medicine as needed.  You are still tested positive for COVID which is not abnormal.  You may retest in about a week to see if you test negative.  Please follow-up with your PCP if your symptoms do not improve.  Please go to the ER if you develop any worsening symptoms.  I hope you feel better soon!

## 2023-03-25 NOTE — ED Triage Notes (Signed)
Patient presents to UC for UTI, had a rapid UTI test. Reports flank pain, urinary freq, aNd abdominal pressure. Took a dose of AZO. Also concerned with continued cough over a week, dx with covid 12/10. Treating symptoms with mucinex. States she just finished prednisone, muscle relaxer, and tramadol for back pain.   Denies fever or SOB.

## 2023-03-25 NOTE — ED Provider Notes (Signed)
[facility]    CSN: [id number] Arrival date & time: 03/25/23  1243      History   Chief Complaint No chief complaint on file.   HPI Christine Valdez is a 72 y.o. female presents for dysuria.  Patient reports several days of urinary burning, urgency, frequency.  Denies hematuria, fevers, nausea/vomiting, flank pain.  No vaginal discharge or STD concern.  She did take Azo OTC for symptoms.  Does report history of reoccurring UTIs.  She states she was in [place] and was having some upper respiratory symptoms and tested positive for COVID on 12/10.  Her symptoms began on 12/4.  She reports she was having some low back pain so she was prescribed a muscle relaxer, and prednisone and tramadol.  She reports she still has a mild cough but her symptoms have improved and she would like to retest for COVID to see if she is negative.  No shortness of breath or chest pain.  No asthma or smoking history.  She has not had COVID in the past.  Eating and drinking normally.  No other concerns at this time.  HPI  Past Medical History:  Diagnosis Date   Allergy    uses Flonase daily as needed and takes Loratadine daily    Anemia    only after birth of 2nd child   Arthritis    Asthma    uses Albuterol daily as needed;exercise induced   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, right cheek   Depression    takes Celexa daily   Depression    Phreesia 07/01/2020   GERD (gastroesophageal reflux disease)    occ   History of colon polyps    History of shingles    5-6 yrs   Hyperlipidemia    Hypothyroid    Insomnia    takes Restoril nightly as needed   Interstitial cystitis    Osteoarthritis    Sjogren's syndrome (HCC)    Spinal headache    after 1st C/S, o2sat low after shoulder surgery after went to room(51%),hard to urinate)   Thyroid disease    Phreesia 09/21/2019    Patient Active Problem List   Diagnosis Date Noted   Genetic testing 04/11/2022   Family history of breast  cancer 03/23/2022   Influenza with respiratory manifestation other than pneumonia 05/05/2018   Recurrent major depressive disorder, in partial remission (HCC) 09/28/2016   Hypothyroidism 09/28/2016   B12 deficiency 09/28/2016   Hyperglycemia 09/28/2016   Pruritus ani 06/25/2015   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   Anxiety 02/04/2015   Major depression, recurrent (HCC) 02/04/2015   Mild intermittent asthma without complication 04/21/2014   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/04/2013    Past Surgical History:  Procedure Laterality Date   CARPAL TUNNEL RELEASE Right 05/2007   CESAREAN SECTION  1981/1984   x 2   COLONOSCOPY     JOINT REPLACEMENT N/A    Phreesia 09/21/2019   TONSILLECTOMY AND ADENOIDECTOMY  age 66   TOTAL SHOULDER ARTHROPLASTY Right 07/04/2013   DR NORRIS   TOTAL SHOULDER ARTHROPLASTY Right 07/04/2013   Procedure: RIGHT TOTAL SHOULDER ARTHROPLASTY;  Surgeon: Verlee Rossetti, MD;  Location: [facility] OR;  Service: Orthopedics;  Laterality: Right;   TOTAL SHOULDER ARTHROPLASTY Left 03/12/2015   Procedure: LEFT SHOULDER TOTAL SHOULDER ARTHROPLASTY;  Surgeon: Beverely Low, MD;  Location: [facility] OR;  Service: Orthopedics;  Laterality: Left;    OB History     Gravida  2   Para  1   Term  0   Preterm  1   AB      Living  2      SAB      IAB      Ectopic      Multiple  1   Live Births  3            Home Medications    Prior to Admission medications   Medication Sig Start Date End Date Taking? Authorizing Provider  cephALEXin (KEFLEX) 500 MG capsule Take 1 capsule (500 mg total) by mouth 2 (two) times daily for 7 days. 03/25/23 04/01/23 Yes Radford Pax, NP  albuterol (VENTOLIN HFA) 108 (90 Base) MCG/ACT inhaler INHALE 2 PUFFS INTO THE LUNGS EVERY 6 HOURS AS NEEDED FOR SHORTNESS OF BREATH 05/23/22   Shade Flood, MD  cholecalciferol (VITAMIN D) 1000 UNITS tablet Take 1,000 Units by mouth daily.    [provider]  citalopram (CELEXA) 40 MG tablet  Take 1 tablet (40 mg total) by mouth daily. 12/19/21   Jerene Bears, MD  docusate sodium (COLACE) 100 MG capsule Take 400 mg by mouth at bedtime.     [provider]  fluticasone (FLONASE) 50 MCG/ACT nasal spray USE 1-2 SPRAYS INTO EACH NOSTRIL ONCE A DAY AS NEEDED 06/05/19   Doristine Bosworth, MD  levothyroxine (SYNTHROID) 75 MCG tablet TAKE 1 TABLET BY MOUTH EVERY DAY 05/23/22   Shade Flood, MD  loratadine (CLARITIN) 10 MG tablet Take 10 mg by mouth daily as needed for allergies.     [provider]  LORazepam (ATIVAN) 0.5 MG tablet TAKE 1 TABLET (0.5 MG TOTAL) BY MOUTH EVERY 8 (EIGHT) HOURS. 01/25/23   Mozingo, Thereasa Solo, NP  sertraline (ZOLOFT) 100 MG tablet Take two tablets daily. 11/09/22   Mozingo, Thereasa Solo, NP  temazepam (RESTORIL) 30 MG capsule TAKE 1 CAPSULE BY MOUTH EVERY DAY AT BEDTIME AS NEEDED 01/24/23   Shade Flood, MD  vitamin B-12 (CYANOCOBALAMIN) 1000 MCG tablet Take 1,000 mcg by mouth every other day.     [provider]    Family History Family History  Problem Relation Age of Onset   Hypertension Mother    Osteoporosis Mother    Diabetes Father    Hypertension Father    Breast cancer Sister 85       stage 4, chemo and radiation   Esophageal cancer Sister        breast cancer met to esophagus   Diabetes Brother    Lung cancer Paternal Aunt    Lung cancer Paternal Aunt    Brain cancer Paternal Uncle    Epilepsy Daughter     Social History Social History   Tobacco Use   Smoking status: Never   Smokeless tobacco: Never  Vaping Use   Vaping status: Never Used  Substance Use Topics   Alcohol use: Yes    Alcohol/week: 2.0 standard drinks of alcohol    Types: 2 Glasses of wine per week   Drug use: No     Allergies   Diflucan [fluconazole], Mercury, Thimerosal (thiomersal), Haemophilus b polysaccharide vaccine, Sulfur, Mercury detox [nutritional supplements], Influenza vaccine recombinant, Levaquin  [levofloxacin], and Sulfa antibiotics   Review of Systems Review of Systems  Respiratory:  Positive for cough.   Genitourinary:  Positive for dysuria.     Physical Exam Triage Vital Signs ED Triage Vitals [03/25/23 1450]  Encounter Vitals Group     BP (!) 159/81  Systolic BP Percentile      Diastolic BP Percentile      Pulse Rate 95     Resp 18     Temp 98.5 F (36.9 C)     Temp Source Oral     SpO2 95 %     Weight      Height      Head Circumference      Peak Flow      Pain Score 8     Pain Loc      Pain Education      Exclude from Growth Chart    No data found.  Updated Vital Signs BP (!) 159/81 (BP Location: Left Arm)   Pulse 95   Temp 98.5 F (36.9 C) (Oral)   Resp 18   LMP 04/11/2003 (Approximate)   SpO2 95%   Visual Acuity Right Eye Distance:   Left Eye Distance:   Bilateral Distance:    Right Eye Near:   Left Eye Near:    Bilateral Near:     Physical Exam Vitals and nursing note reviewed.  Constitutional:      General: She is not in acute distress.    Appearance: She is well-developed. She is not ill-appearing.  HENT:     Head: Normocephalic and atraumatic.     Right Ear: Tympanic membrane and ear canal normal.     Left Ear: Tympanic membrane and ear canal normal.     Nose: No congestion or rhinorrhea.     Mouth/Throat:     Mouth: Mucous membranes are moist.     Pharynx: Oropharynx is clear. Uvula midline. No oropharyngeal exudate or posterior oropharyngeal erythema.     Tonsils: No tonsillar exudate or tonsillar abscesses.  Eyes:     Conjunctiva/sclera: Conjunctivae normal.     Pupils: Pupils are equal, round, and reactive to light.  Cardiovascular:     Rate and Rhythm: Normal rate and regular rhythm.     Heart sounds: Normal heart sounds.  Pulmonary:     Effort: Pulmonary effort is normal.     Breath sounds: Normal breath sounds.  Abdominal:     Tenderness: There is no right CVA tenderness or left CVA tenderness.   Musculoskeletal:     Cervical back: Normal range of motion and neck supple.  Lymphadenopathy:     Cervical: No cervical adenopathy.  Skin:    General: Skin is warm and dry.  Neurological:     General: No focal deficit present.     Mental Status: She is alert and oriented to person, place, and time.  Psychiatric:        Mood and Affect: Mood normal.        Behavior: Behavior normal.      UC Treatments / Results  Labs (all labs ordered are listed, but only abnormal results are displayed) Labs Reviewed  POCT URINALYSIS DIP (MANUAL ENTRY) - Abnormal; Notable for the following components:      Result Value   Color, UA orange (*)    Glucose, UA =100 (*)    Nitrite, UA Positive (*)    All other components within normal limits  POC COVID19/FLU A&B COMBO - Abnormal; Notable for the following components:   Covid Antigen, POC Positive (*)    All other components within normal limits  URINE CULTURE    EKG   Radiology No results found.  Procedures Procedures (including critical care time)  Medications Ordered in UC Medications - No data to display  Initial Impression / Assessment and Plan / UC Course  I have reviewed the triage vital signs and the nursing notes.  Pertinent labs & imaging results that were available during my care of the patient were reviewed by me and considered in my medical decision making (see chart for details).     Reviewed exam and symptoms with patient.  No red flags.  UA shows nitrates but patient did take Azo, will culture and treat based on symptoms with Keflex twice daily for 7 days.  Will contact for any positive culture results.Patient with reported positive COVID test on 12/10 with onset of symptoms 11 days ago.  Reports symptoms have improved.  Lung exam normal and vitals are stable.  Will defer chest x-ray at this time as patient does report improving symptoms.  Patient requested retest of COVID to see if she is negative, rapid COVID is  positive.  Informed patient.  Advised to continue over-the-counter viral symptom treatments as needed.  She can do a home test of COVID again in 1 week to see if she is negative. Final Clinical Impressions(s) / UC Diagnoses   Final diagnoses:  Acute cystitis without hematuria  Acute cough  COVID-19     Discharge Instructions      The clinic will contact you with results of the urine culture done today if positive.  Start Keflex twice daily for 7 days.  Lots of rest and fluids.  Continue over-the-counter cough medicine as needed.  You are still tested positive for COVID which is not abnormal.  You may retest in about a week to see if you test negative.  Please follow-up with your PCP if your symptoms do not improve.  Please go to the ER if you develop any worsening symptoms.  I hope you feel better soon!     ED Prescriptions     Medication Sig Dispense Auth. Provider   cephALEXin (KEFLEX) 500 MG capsule Take 1 capsule (500 mg total) by mouth 2 (two) times daily for 7 days. 14 capsule Radford Pax, NP      PDMP not reviewed this encounter.   Radford Pax, NP 03/25/23 1538

## 2023-03-26 LAB — URINE CULTURE: Culture: NO GROWTH

## 2023-04-02 ENCOUNTER — Ambulatory Visit (HOSPITAL_COMMUNITY)
Admission: RE | Admit: 2023-04-02 | Discharge: 2023-04-02 | Disposition: A | Discharge status: Home / Self Care | Payer: Medicare HMO | Source: Ambulatory Visit | Attending: Emergency Medicine | Admitting: Emergency Medicine

## 2023-04-02 ENCOUNTER — Encounter (HOSPITAL_COMMUNITY): Payer: Self-pay

## 2023-04-02 ENCOUNTER — Ambulatory Visit (INDEPENDENT_AMBULATORY_CARE_PROVIDER_SITE_OTHER): Payer: Medicare HMO

## 2023-04-02 VITALS — BP 147/90 | HR 85 | Temp 98.1°F | Resp 18

## 2023-04-02 DIAGNOSIS — M545 Low back pain, unspecified: Secondary | ICD-10-CM | POA: Diagnosis not present

## 2023-04-02 DIAGNOSIS — G5701 Lesion of sciatic nerve, right lower limb: Secondary | ICD-10-CM | POA: Diagnosis not present

## 2023-04-02 DIAGNOSIS — M25551 Pain in right hip: Secondary | ICD-10-CM | POA: Diagnosis not present

## 2023-04-02 DIAGNOSIS — Z96641 Presence of right artificial hip joint: Secondary | ICD-10-CM | POA: Diagnosis not present

## 2023-04-02 DIAGNOSIS — Z471 Aftercare following joint replacement surgery: Secondary | ICD-10-CM | POA: Diagnosis not present

## 2023-04-02 MED ORDER — BACLOFEN 5 MG PO TABS: 5.0000 mg | ORAL_TABLET | Freq: Two times a day (BID) | ORAL | 0 refills | Status: DC | PRN

## 2023-04-02 MED ORDER — GABAPENTIN 300 MG PO CAPS: 300.0000 mg | ORAL_CAPSULE | Freq: Every evening | ORAL | 0 refills | Status: DC

## 2023-04-02 NOTE — Discharge Instructions (Addendum)
Your xray does not show any abnormality of your hip and replacement hardware  I suspect you have a muscle or nerve issue; potentially the piriformis muscle and sciatic nerve. There is some information attached about these.  Please try the gabapentin; take 1 pill nightly before bedtime You can also try the baclofen which is a different muscle relaxer. Use twice daily unless it makes you drowsy, then take only once before bed.  Please contact your orthopedic specialist to make an appointment for follow up. You will need to see the specialist since your symptoms have continued despite other interventions.   Please go to the emergency department if symptoms worsen.

## 2023-04-02 NOTE — ED Provider Notes (Signed)
[facility]    CSN: [id number] Arrival date & time: 04/02/23  1245     History   Chief Complaint Chief Complaint  Patient presents with   Back Pain    I also have a rash in my groin area. Suspicious that I might have shingles - Entered by patient   Rash    HPI Christine Valdez is a 72 y.o. female.  Right sided low back/glute pain that started in November after she fell on the stairs. Has been worsening recently. Today is rating 10/10 pain. Does not radiate.  She went to [place] urgent care on 12/5. Was given prednisone, tramadol, robaxin without change Has also been using 400 mg ibuprofen  Feels the same as shingles pain, but has not had rash in this area. She did have rash on the left groin that's going away on its own. Not itching or painful.  History of R hip replacement   Past Medical History:  Diagnosis Date   Allergy    uses Flonase daily as needed and takes Loratadine daily    Anemia    only after birth of 2nd child   Arthritis    Asthma    uses Albuterol daily as needed;exercise induced   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, right cheek   Depression    takes Celexa daily   Depression    Phreesia 07/01/2020   GERD (gastroesophageal reflux disease)    occ   History of colon polyps    History of shingles    5-6 yrs   Hyperlipidemia    Hypothyroid    Insomnia    takes Restoril nightly as needed   Interstitial cystitis    Osteoarthritis    Sjogren's syndrome (HCC)    Spinal headache    after 1st C/S, o2sat low after shoulder surgery after went to room(51%),hard to urinate)   Thyroid disease    Phreesia 09/21/2019    Patient Active Problem List   Diagnosis Date Noted   Genetic testing 04/11/2022   Family history of breast cancer 03/23/2022   Influenza with respiratory manifestation other than pneumonia 05/05/2018   Recurrent major depressive disorder, in partial remission (HCC) 09/28/2016   Hypothyroidism 09/28/2016   B12  deficiency 09/28/2016   Hyperglycemia 09/28/2016   Pruritus ani 06/25/2015   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   Anxiety 02/04/2015   Major depression, recurrent (HCC) 02/04/2015   Mild intermittent asthma without complication 04/21/2014   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/04/2013    Past Surgical History:  Procedure Laterality Date   CARPAL TUNNEL RELEASE Right 05/2007   CESAREAN SECTION  1981/1984   x 2   COLONOSCOPY     JOINT REPLACEMENT N/A    Phreesia 09/21/2019   TONSILLECTOMY AND ADENOIDECTOMY  age 31   TOTAL SHOULDER ARTHROPLASTY Right 07/04/2013   DR NORRIS   TOTAL SHOULDER ARTHROPLASTY Right 07/04/2013   Procedure: RIGHT TOTAL SHOULDER ARTHROPLASTY;  Surgeon: Verlee Rossetti, MD;  Location: [facility] OR;  Service: Orthopedics;  Laterality: Right;   TOTAL SHOULDER ARTHROPLASTY Left 03/12/2015   Procedure: LEFT SHOULDER TOTAL SHOULDER ARTHROPLASTY;  Surgeon: Beverely Low, MD;  Location: [facility] OR;  Service: Orthopedics;  Laterality: Left;    OB History     Gravida  2   Para  1   Term  0   Preterm  1   AB      Living  2      SAB      IAB  Ectopic      Multiple  1   Live Births  3            Home Medications    Prior to Admission medications   Medication Sig Start Date End Date Taking? Authorizing Provider  albuterol (VENTOLIN HFA) 108 (90 Base) MCG/ACT inhaler INHALE 2 PUFFS INTO THE LUNGS EVERY 6 HOURS AS NEEDED FOR SHORTNESS OF BREATH 05/23/22   Shade Flood, MD  Baclofen 5 MG TABS Take 1 tablet (5 mg total) by mouth 2 (two) times daily as needed. 04/02/23  Yes Letoya Stallone, Lurena Joiner, PA-C  cholecalciferol (VITAMIN D) 1000 UNITS tablet Take 1,000 Units by mouth daily.    [provider]  citalopram (CELEXA) 40 MG tablet Take 1 tablet (40 mg total) by mouth daily. 12/19/21   Jerene Bears, MD  docusate sodium (COLACE) 100 MG capsule Take 400 mg by mouth at bedtime.     [provider]  fluticasone (FLONASE) 50 MCG/ACT nasal spray USE 1-2  SPRAYS INTO EACH NOSTRIL ONCE A DAY AS NEEDED 06/05/19   Collie Siad A, MD  gabapentin (NEURONTIN) 300 MG capsule Take 1 capsule (300 mg total) by mouth at bedtime. 04/02/23  Yes Alfonso Carden, Lurena Joiner, PA-C  levothyroxine (SYNTHROID) 75 MCG tablet TAKE 1 TABLET BY MOUTH EVERY DAY 05/23/22   Shade Flood, MD  loratadine (CLARITIN) 10 MG tablet Take 10 mg by mouth daily as needed for allergies.     [provider]  LORazepam (ATIVAN) 0.5 MG tablet TAKE 1 TABLET (0.5 MG TOTAL) BY MOUTH EVERY 8 (EIGHT) HOURS. 01/25/23   Mozingo, Thereasa Solo, NP  sertraline (ZOLOFT) 100 MG tablet Take two tablets daily. 11/09/22   Mozingo, Thereasa Solo, NP  temazepam (RESTORIL) 30 MG capsule TAKE 1 CAPSULE BY MOUTH EVERY DAY AT BEDTIME AS NEEDED 01/24/23   Shade Flood, MD  vitamin B-12 (CYANOCOBALAMIN) 1000 MCG tablet Take 1,000 mcg by mouth every other day.     [provider]    Family History Family History  Problem Relation Age of Onset   Hypertension Mother    Osteoporosis Mother    Diabetes Father    Hypertension Father    Breast cancer Sister 8       stage 4, chemo and radiation   Esophageal cancer Sister        breast cancer met to esophagus   Diabetes Brother    Lung cancer Paternal Aunt    Lung cancer Paternal Aunt    Brain cancer Paternal Uncle    Epilepsy Daughter     Social History Social History   Tobacco Use   Smoking status: Never   Smokeless tobacco: Never  Vaping Use   Vaping status: Never Used  Substance Use Topics   Alcohol use: Yes    Alcohol/week: 2.0 standard drinks of alcohol    Types: 2 Glasses of wine per week   Drug use: No     Allergies   Diflucan [fluconazole], Mercury, Thimerosal (thiomersal), Haemophilus b polysaccharide vaccine, Sulfur, Mercury detox [nutritional supplements], Influenza vaccine recombinant, Levaquin [levofloxacin], and Sulfa antibiotics   Review of Systems Review of Systems Per HPI  Physical Exam Triage  Vital Signs ED Triage Vitals  Encounter Vitals Group     BP 04/02/23 1303 (!) 147/90     Systolic BP Percentile --      Diastolic BP Percentile --      Pulse Rate 04/02/23 1303 85     Resp 04/02/23 1303  18     Temp 04/02/23 1303 98.1 F (36.7 C)     Temp Source 04/02/23 1303 Oral     SpO2 04/02/23 1303 95 %     Weight --      Height --      Head Circumference --      Peak Flow --      Pain Score 04/02/23 1302 10     Pain Loc --      Pain Education --      Exclude from Growth Chart --    No data found.  Updated Vital Signs BP (!) 147/90 (BP Location: Left Arm)   Pulse 85   Temp 98.1 F (36.7 C) (Oral)   Resp 18   LMP 04/11/2003 (Approximate)   SpO2 95%   Physical Exam Vitals and nursing note reviewed.  Constitutional:      General: She is not in acute distress. HENT:     Mouth/Throat:     Mouth: Mucous membranes are moist.     Pharynx: Oropharynx is clear.  Eyes:     Extraocular Movements: Extraocular movements intact.     Conjunctiva/sclera: Conjunctivae normal.     Pupils: Pupils are equal, round, and reactive to light.  Cardiovascular:     Rate and Rhythm: Normal rate and regular rhythm.     Pulses: Normal pulses.     Heart sounds: Normal heart sounds.  Pulmonary:     Effort: Pulmonary effort is normal.     Breath sounds: Normal breath sounds.  Musculoskeletal:        General: Normal range of motion.     Cervical back: Normal range of motion. No rigidity or tenderness.     Lumbar back: Tenderness present.       Back:     Comments: Point tenderness of right low back into glute. No rash, erythema, lesions, or bruising here. Tight muscle palpated. No bony tenderness of C-L spine  Skin:    General: Skin is warm and dry.     Findings: No rash.     Comments: No rash in area of pain. Patient has photo of the left groin rash that has resolved. Per photo, not shingles.   Neurological:     General: No focal deficit present.     Mental Status: She is alert and  oriented to person, place, and time.     Cranial Nerves: Cranial nerves 2-12 are intact. No cranial nerve deficit.     Sensory: Sensation is intact.     Motor: Motor function is intact. No weakness.     Coordination: Coordination is intact.     Gait: Gait is intact.     Deep Tendon Reflexes: Reflexes are normal and symmetric.     Comments: Strength 5/5. Sensation intact throughout     UC Treatments / Results  Labs (all labs ordered are listed, but only abnormal results are displayed) Labs Reviewed - No data to display  EKG  Radiology DG Hip Unilat With Pelvis 2-3 Views Right Result Date: 04/02/2023 CLINICAL DATA:  Right lower back pain for several weeks. EXAM: DG HIP (WITH OR WITHOUT PELVIS) 2-3V RIGHT COMPARISON:  None Available. FINDINGS: Status post right total hip arthroplasty. No fracture or dislocation is noted. IMPRESSION: No acute abnormality seen. Electronically Signed   By: Lupita Raider M.D.   On: 04/02/2023 14:38    Procedures Procedures   Medications Ordered in UC Medications - No data to display  Initial Impression /  Assessment and Plan / UC Course  I have reviewed the triage vital signs and the nursing notes.  Pertinent labs & imaging results that were available during my care of the patient were reviewed by me and considered in my medical decision making (see chart for details).  With fall and continued pain I recommend imaging. Patient reports that would make her feel better. Right hip xray with no acute abnormality.   Discussed anatomy of piriformis muscle and sciatic nerve, I suspect she has a combination of inflammation in these areas. The prednisone a few weeks ago did not seem to help. She took 40 mg daily x 5 days. Will recommend gabapentin 300 mg nightly Baclofen 5 mg BID or nightly - drowsy precautions.  Close follow with orthopedics. She sees [facility].  ED for any worsening   Final Clinical Impressions(s) / UC Diagnoses   Final diagnoses:   Right hip pain  Pyriformis syndrome, right  Acute right-sided low back pain without sciatica     Discharge Instructions      Your xray does not show any abnormality of your hip and replacement hardware  I suspect you have a muscle or nerve issue; potentially the piriformis muscle and sciatic nerve. There is some information attached about these.  Please try the gabapentin; take 1 pill nightly before bedtime You can also try the baclofen which is a different muscle relaxer. Use twice daily unless it makes you drowsy, then take only once before bed.  Please contact your orthopedic specialist to make an appointment for follow up. You will need to see the specialist since your symptoms have continued despite other interventions.   Please go to the emergency department if symptoms worsen.     ED Prescriptions     Medication Sig Dispense Auth. Provider   gabapentin (NEURONTIN) 300 MG capsule Take 1 capsule (300 mg total) by mouth at bedtime. 30 capsule Sebasthian Stailey, PA-C   Baclofen 5 MG TABS Take 1 tablet (5 mg total) by mouth 2 (two) times daily as needed. 20 tablet Dennie Moltz, Lurena Joiner, PA-C      PDMP not reviewed this encounter.   Marlow Baars, [place] 04/02/23 1553

## 2023-04-02 NOTE — ED Triage Notes (Signed)
Pt presents with complaints of lower right back pain and rash to left groin area. Pt reports her back pain began at the beginning of December. Pt was recently diagnosed with UTI (and COVID), finished with antibiotics. Pt states she has had shingles before and this feels similar to the pain. Pt has been seen for her back pain, reports they believed it was muscular pain, muscle relaxant taken with no improvement. Pt currently rates her pain a 10/10.

## 2023-04-18 DIAGNOSIS — M47896 Other spondylosis, lumbar region: Secondary | ICD-10-CM | POA: Diagnosis not present

## 2023-04-18 DIAGNOSIS — M51369 Other intervertebral disc degeneration, lumbar region without mention of lumbar back pain or lower extremity pain: Secondary | ICD-10-CM | POA: Diagnosis not present

## 2023-04-21 DIAGNOSIS — M47816 Spondylosis without myelopathy or radiculopathy, lumbar region: Secondary | ICD-10-CM | POA: Insufficient documentation

## 2023-04-23 ENCOUNTER — Other Ambulatory Visit: Payer: Self-pay | Admitting: Adult Health

## 2023-04-23 DIAGNOSIS — F411 Generalized anxiety disorder: Secondary | ICD-10-CM

## 2023-05-01 ENCOUNTER — Telehealth: Payer: Self-pay | Admitting: Family Medicine

## 2023-05-01 NOTE — Telephone Encounter (Signed)
Does not need a phone note, no provider signature required.

## 2023-05-01 NOTE — Telephone Encounter (Signed)
Type of form received:[facility]   Additional comments: [employer]- Levothyroxine Recall Info   Received ZO:[id number]- Front Desk   Form should be Faxed/mailed to: N/A  Is patient requesting call for pickup:N/A  Form placed: [employer] charge sheet.  Provider will determine charge. N/A  Individual made aware of 3-5 business day turn around No?

## 2023-05-03 ENCOUNTER — Telehealth: Payer: Self-pay

## 2023-05-03 NOTE — Telephone Encounter (Signed)
Received fax from [facility] requesting multiple patients have alternatives to their typical Levothyroxine tabs of varying strengths  Need to contact the pharmacy and find out if these have already been supplemented or if this change needs to be sent in by Dr Neva Seat as a new prescription

## 2023-05-04 NOTE — Telephone Encounter (Signed)
Patient is due for an office visit.

## 2023-05-07 NOTE — Telephone Encounter (Signed)
Called and LM to have her call back and make an appointment to be seen

## 2023-05-08 NOTE — Telephone Encounter (Signed)
Sent unable to contact letter.

## 2023-05-14 ENCOUNTER — Encounter (HOSPITAL_COMMUNITY): Payer: Self-pay

## 2023-05-14 ENCOUNTER — Ambulatory Visit (HOSPITAL_COMMUNITY)
Admission: RE | Admit: 2023-05-14 | Discharge: 2023-05-14 | Disposition: A | Discharge status: Home / Self Care | Payer: Medicare HMO | Source: Ambulatory Visit | Attending: Family Medicine | Admitting: Family Medicine

## 2023-05-14 VITALS — BP 151/89 | HR 88 | Temp 98.4°F | Resp 18

## 2023-05-14 DIAGNOSIS — J029 Acute pharyngitis, unspecified: Secondary | ICD-10-CM | POA: Insufficient documentation

## 2023-05-14 DIAGNOSIS — R5383 Other fatigue: Secondary | ICD-10-CM | POA: Insufficient documentation

## 2023-05-14 LAB — COMPREHENSIVE METABOLIC PANEL
ALT: 78 U/L — ABNORMAL HIGH (ref 0–44)
AST: 87 U/L — ABNORMAL HIGH (ref 15–41)
Albumin: 3.8 g/dL (ref 3.5–5.0)
Alkaline Phosphatase: 51 U/L (ref 38–126)
Anion gap: 9 (ref 5–15)
BUN: 15 mg/dL (ref 8–23)
CO2: 27 mmol/L (ref 22–32)
Calcium: 10 mg/dL (ref 8.9–10.3)
Chloride: 104 mmol/L (ref 98–111)
Creatinine, Ser: 0.98 mg/dL (ref 0.44–1.00)
GFR, Estimated: >60 mL/min (ref >60)
Glucose, Bld: 56 mg/dL — ABNORMAL LOW (ref 70–99)
Potassium: 4.5 mmol/L (ref 3.5–5.1)
Sodium: 140 mmol/L (ref 135–145)
Total Bilirubin: 0.8 mg/dL (ref 0.0–1.2)
Total Protein: 6.6 g/dL (ref 6.5–8.1)

## 2023-05-14 LAB — CBC WITH DIFFERENTIAL/PLATELET
Abs Immature Granulocytes: 0.02 10*3/uL (ref 0.00–0.07)
Basophils Absolute: 0.1 10*3/uL (ref 0.0–0.1)
Basophils Relative: 1 %
Eosinophils Absolute: 0.2 10*3/uL (ref 0.0–0.5)
Eosinophils Relative: 3 %
HCT: 41.9 % (ref 36.0–46.0)
Hemoglobin: 13.9 g/dL (ref 12.0–15.0)
Immature Granulocytes: 0 %
Lymphocytes Relative: 29 %
Lymphs Abs: 1.5 10*3/uL (ref 0.7–4.0)
MCH: 29.6 pg (ref 26.0–34.0)
MCHC: 33.2 g/dL (ref 30.0–36.0)
MCV: 89.1 fL (ref 80.0–100.0)
Monocytes Absolute: 0.5 10*3/uL (ref 0.1–1.0)
Monocytes Relative: 10 %
Neutro Abs: 3 10*3/uL (ref 1.7–7.7)
Neutrophils Relative %: 57 %
Platelets: 155 10*3/uL (ref 150–400)
RBC: 4.7 MIL/uL (ref 3.87–5.11)
RDW: 12.9 % (ref 11.5–15.5)
WBC: 5.2 10*3/uL (ref 4.0–10.5)
nRBC: 0 % (ref 0.0–0.2)

## 2023-05-14 LAB — TSH: TSH: 2.571 u[IU]/mL (ref 0.350–4.500)

## 2023-05-14 LAB — POCT MONO SCREEN (KUC): Mono, POC: NEGATIVE

## 2023-05-14 LAB — POCT RAPID STREP A (OFFICE): Rapid Strep A Screen: NEGATIVE

## 2023-05-14 NOTE — Discharge Instructions (Signed)
Your strep test is negative.  Culture of the throat will be sent, and staff will notify you if that is in turn positive.  The monotest was also negative.  We have drawn blood to check your blood counts and for anemia; your electrolytes, sugar, and kidney and liver function; and your thyroid function.  Staff will notify of anything significantly abnormal  Please follow-up with your primary care.

## 2023-05-14 NOTE — ED Triage Notes (Signed)
Pt states she has sore throat X 1 week. She complains of fatigues since she had COVID in December. She has been taking IBU as needed.

## 2023-05-14 NOTE — ED Provider Notes (Signed)
[facility]    CSN: [id number] Arrival date & time: 05/14/23  1515      History   Chief Complaint Chief Complaint  Patient presents with   Sore Throat    I had Covid in December 2024, have a severe sore throat with white patches, severe headache,  and am extremely tired. All I want to do is sleep. Might be either Strep Throat or a bad sinus infection. - Entered by patient   Fatigue    HPI Christine Valdez is a 73 y.o. female.    Sore Throat  For sore throat for about 1 week.  She has maybe had a little congestion and postnasal drainage.  No fever.  She had COVID in December when she was visiting her family in [place].  Since then she has felt very tired and had some headache.  All she wants to do is sleep. Past medical history significant for hypothyroidism and arthritis.   Past Medical History:  Diagnosis Date   Allergy    uses Flonase daily as needed and takes Loratadine daily    Anemia    only after birth of 2nd child   Arthritis    Asthma    uses Albuterol daily as needed;exercise induced   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, right cheek   Depression    takes Celexa daily   Depression    Phreesia 07/01/2020   GERD (gastroesophageal reflux disease)    occ   History of colon polyps    History of shingles    5-6 yrs   Hyperlipidemia    Hypothyroid    Insomnia    takes Restoril nightly as needed   Interstitial cystitis    Osteoarthritis    Sjogren's syndrome (HCC)    Spinal headache    after 1st C/S, o2sat low after shoulder surgery after went to room(51%),hard to urinate)   Thyroid disease    Phreesia 09/21/2019    Patient Active Problem List   Diagnosis Date Noted   Genetic testing 04/11/2022   Family history of breast cancer 03/23/2022   Influenza with respiratory manifestation other than pneumonia 05/05/2018   Recurrent major depressive disorder, in partial remission (HCC) 09/28/2016   Hypothyroidism 09/28/2016   B12  deficiency 09/28/2016   Hyperglycemia 09/28/2016   Pruritus ani 06/25/2015   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   Anxiety 02/04/2015   Major depression, recurrent (HCC) 02/04/2015   Mild intermittent asthma without complication 04/21/2014   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/04/2013    Past Surgical History:  Procedure Laterality Date   CARPAL TUNNEL RELEASE Right 05/2007   CESAREAN SECTION  1981/1984   x 2   COLONOSCOPY     JOINT REPLACEMENT N/A    Phreesia 09/21/2019   TONSILLECTOMY AND ADENOIDECTOMY  age 51   TOTAL SHOULDER ARTHROPLASTY Right 07/04/2013   DR NORRIS   TOTAL SHOULDER ARTHROPLASTY Right 07/04/2013   Procedure: RIGHT TOTAL SHOULDER ARTHROPLASTY;  Surgeon: Verlee Rossetti, MD;  Location: [facility] OR;  Service: Orthopedics;  Laterality: Right;   TOTAL SHOULDER ARTHROPLASTY Left 03/12/2015   Procedure: LEFT SHOULDER TOTAL SHOULDER ARTHROPLASTY;  Surgeon: Beverely Low, MD;  Location: [facility] OR;  Service: Orthopedics;  Laterality: Left;    OB History     Gravida  2   Para  1   Term  0   Preterm  1   AB      Living  2      SAB  IAB      Ectopic      Multiple  1   Live Births  3            Home Medications    Prior to Admission medications   Medication Sig Start Date End Date Taking? Authorizing Provider  albuterol (VENTOLIN HFA) 108 (90 Base) MCG/ACT inhaler INHALE 2 PUFFS INTO THE LUNGS EVERY 6 HOURS AS NEEDED FOR SHORTNESS OF BREATH 05/23/22  Yes Shade Flood, MD  cholecalciferol (VITAMIN D) 1000 UNITS tablet Take 1,000 Units by mouth daily.   Yes [provider]  docusate sodium (COLACE) 100 MG capsule Take 400 mg by mouth at bedtime.    Yes [provider]  fluticasone (FLONASE) 50 MCG/ACT nasal spray USE 1-2 SPRAYS INTO EACH NOSTRIL ONCE A DAY AS NEEDED 06/05/19  Yes Stallings, Zoe A, MD  gabapentin (NEURONTIN) 300 MG capsule Take 1 capsule (300 mg total) by mouth at bedtime. 04/02/23  Yes Rising, Lurena Joiner, PA-C  levothyroxine  (SYNTHROID) 75 MCG tablet TAKE 1 TABLET BY MOUTH EVERY DAY 05/23/22  Yes Shade Flood, MD  loratadine (CLARITIN) 10 MG tablet Take 10 mg by mouth daily as needed for allergies.    Yes [provider]  LORazepam (ATIVAN) 0.5 MG tablet TAKE 1 TABLET (0.5 MG TOTAL) BY MOUTH EVERY 8 (EIGHT) HOURS. 04/23/23  Yes Mozingo, Thereasa Solo, NP  sertraline (ZOLOFT) 100 MG tablet Take two tablets daily. 11/09/22  Yes Mozingo, Thereasa Solo, NP  temazepam (RESTORIL) 30 MG capsule TAKE 1 CAPSULE BY MOUTH EVERY DAY AT BEDTIME AS NEEDED 01/24/23  Yes Shade Flood, MD  vitamin B-12 (CYANOCOBALAMIN) 1000 MCG tablet Take 1,000 mcg by mouth every other day.    Yes [provider]  Baclofen 5 MG TABS Take 1 tablet (5 mg total) by mouth 2 (two) times daily as needed. 04/02/23   Rising, Lurena Joiner, PA-C  citalopram (CELEXA) 40 MG tablet Take 1 tablet (40 mg total) by mouth daily. 12/19/21   Jerene Bears, MD    Family History Family History  Problem Relation Age of Onset   Hypertension Mother    Osteoporosis Mother    Diabetes Father    Hypertension Father    Breast cancer Sister 14       stage 4, chemo and radiation   Esophageal cancer Sister        breast cancer met to esophagus   Diabetes Brother    Lung cancer Paternal Aunt    Lung cancer Paternal Aunt    Brain cancer Paternal Uncle    Epilepsy Daughter     Social History Social History   Tobacco Use   Smoking status: Never   Smokeless tobacco: Never  Vaping Use   Vaping status: Never Used  Substance Use Topics   Alcohol use: Yes    Alcohol/week: 2.0 standard drinks of alcohol    Types: 2 Glasses of wine per week   Drug use: No     Allergies   Diflucan [fluconazole], Mercury, Thimerosal (thiomersal), Haemophilus b polysaccharide vaccine, Sulfur, Mercury detox [nutritional supplements], Influenza vaccine recombinant, Levaquin [levofloxacin], and Sulfa antibiotics   Review of Systems Review of  Systems   Physical Exam Triage Vital Signs ED Triage Vitals  Encounter Vitals Group     BP 05/14/23 1537 (!) 151/89     Systolic BP Percentile --      Diastolic BP Percentile --      Pulse Rate 05/14/23 1537 88  Resp 05/14/23 1537 18     Temp 05/14/23 1537 98.4 F (36.9 C)     Temp Source 05/14/23 1537 Oral     SpO2 05/14/23 1537 95 %     Weight --      Height --      Head Circumference --      Peak Flow --      Pain Score 05/14/23 1535 8     Pain Loc --      Pain Education --      Exclude from Growth Chart --    No data found.  Updated Vital Signs BP (!) 151/89 (BP Location: Left Arm)   Pulse 88   Temp 98.4 F (36.9 C) (Oral)   Resp 18   LMP 04/11/2003 (Approximate)   SpO2 95%   Visual Acuity Right Eye Distance:   Left Eye Distance:   Bilateral Distance:    Right Eye Near:   Left Eye Near:    Bilateral Near:     Physical Exam Vitals reviewed.  Constitutional:      General: She is not in acute distress.    Appearance: She is not ill-appearing, toxic-appearing or diaphoretic.  HENT:     Right Ear: Tympanic membrane and ear canal normal.     Left Ear: Tympanic membrane and ear canal normal.     Nose: Congestion present.     Mouth/Throat:     Mouth: Mucous membranes are moist.     Comments: There is white exudate draining Eyes:     Extraocular Movements: Extraocular movements intact.     Conjunctiva/sclera: Conjunctivae normal.     Pupils: Pupils are equal, round, and reactive to light.  Cardiovascular:     Rate and Rhythm: Normal rate and regular rhythm.     Heart sounds: No murmur heard. Pulmonary:     Effort: No respiratory distress.     Breath sounds: No stridor. No wheezing, rhonchi or rales.  Musculoskeletal:     Cervical back: Neck supple.  Lymphadenopathy:     Cervical: No cervical adenopathy.  Skin:    Capillary Refill: Capillary refill takes less than 2 seconds.     Coloration: Skin is not jaundiced or pale.  Neurological:      General: No focal deficit present.     Mental Status: She is alert and oriented to person, place, and time.  Psychiatric:        Behavior: Behavior normal.      UC Treatments / Results  Labs (all labs ordered are listed, but only abnormal results are displayed) Labs Reviewed  POCT RAPID STREP A (OFFICE) - Normal  CULTURE, GROUP A STREP ([facility])  COMPREHENSIVE METABOLIC PANEL  CBC WITH DIFFERENTIAL/PLATELET  TSH  POCT MONO SCREEN ([facility])    EKG   Radiology No results found.  Procedures Procedures (including critical care time)  Medications Ordered in UC Medications - No data to display  Initial Impression / Assessment and Plan / UC Course  I have reviewed the triage vital signs and the nursing notes.  Pertinent labs & imaging results that were available during my care of the patient were reviewed by me and considered in my medical decision making (see chart for details).     Rapid strep is negative.  Throat culture is sent and we will notify and treat protocol if that is positive  Monotest is negative.  CBC, CMP, and TSH are drawn today for her extended history of fatigue.  We did discuss  briefly that she might have long COVID.  I have asked her to follow-up with her primary care   Final Clinical Impressions(s) / UC Diagnoses   Final diagnoses:  Sore throat  Fatigue, unspecified type     Discharge Instructions      Your strep test is negative.  Culture of the throat will be sent, and staff will notify you if that is in turn positive.  The monotest was also negative.  We have drawn blood to check your blood counts and for anemia; your electrolytes, sugar, and kidney and liver function; and your thyroid function.  Staff will notify of anything significantly abnormal  Please follow-up with your primary care.     ED Prescriptions   None    PDMP not reviewed this encounter.   Zenia Resides, MD 05/14/23 [phone]

## 2023-05-15 ENCOUNTER — Ambulatory Visit: Payer: Self-pay | Admitting: Family Medicine

## 2023-05-15 ENCOUNTER — Telehealth: Payer: Medicare HMO | Admitting: Adult Health

## 2023-05-15 NOTE — Telephone Encounter (Signed)
 Reason for Disposition  . [1] MILD dizziness (e.g., walking normally) AND [2] has NOT been evaluated by doctor (or NP/PA) for this  (Exception: Dizziness caused by heat exposure, sudden standing, or poor fluid intake.)    Protocols used: Dizziness - Lightheadedness-A-AH

## 2023-05-15 NOTE — Telephone Encounter (Addendum)
 Copied from CRM [id number]. Topic: Clinical - Medical Advice >> May 15, 2023  2:04 PM Leotis ORN wrote: Reason for CRM: patient was seen in Urgent care yesterday and received abnormal results and does not know how to interpret them,  per notes it is awaiting the provider to review them, however patient thinks she may have long COVID and she is wanting to know  if she should schedule an appt or wait for labs to be reviewed, patient stated the results were non fasting.  Symptoms: Headache, dizziness, sore throat, low energy Disposition: [] ED /[] Urgent Care (no appt availability in office) / [x] Appointment(In office/virtual)/ []  [facility]/ [] Home Care/ [] Refused Recommended Disposition /[] [facility]/ []  Follow-up with PCP Additional Notes: Patient was seen in urgent care yesterday. She stated she is not feeling well. She is having headache, sore throat, loss of appetite, lack of energy. She also found out she had Covid back in December. She stated she was at urgent care yesterday and they ran some tests which she needs results for. She wasn't prescribed anything to help with her symptoms. She stated she is also having dizziness which started today. Patient has been scheduled for an appointment tomorrow.     Answer Assessment - Initial Assessment Questions 1. DESCRIPTION: Describe your dizziness.     Mild dizziness  2. SEVERITY: How bad is it?  Do you feel like you are going to faint? Can you stand and walk?   - MILD: Feels slightly dizzy, but walking normally.   - MODERATE: Feels unsteady when walking, but not falling; interferes with normal activities (e.g., school, work).   - SEVERE: Unable to walk without falling, or requires assistance to walk without falling; feels like passing out now.      Mild  3. ONSET:  When did the dizziness begin?     Today  4. CAUSE: What do you think is causing the dizziness?     Thought it could related to Covid  5. OTHER  SYMPTOMS: Do you have any other symptoms? (e.g., fever, chest pain, vomiting, diarrhea, bleeding)       Little bit of chest tightness, pt stated this happens with her bronchitis  Protocols used: Dizziness - Lightheadedness-A-AH

## 2023-05-15 NOTE — Telephone Encounter (Signed)
 Copied from CRM [id number]. Topic: Clinical - Request for Lab/Test Order >> May 15, 2023  2:43 PM Corin V wrote: Reason for CRM: Patient is wanting to know if Dr. Levora would like for her to have any labs done before her appointment on 05/30/23. Please let her know and also if they need to be fasted labs.

## 2023-05-15 NOTE — Telephone Encounter (Signed)
Called pt to clarify this message.

## 2023-05-15 NOTE — Telephone Encounter (Signed)
 Left vm to call office

## 2023-05-16 ENCOUNTER — Encounter: Payer: Self-pay | Admitting: Nurse Practitioner

## 2023-05-16 ENCOUNTER — Ambulatory Visit (INDEPENDENT_AMBULATORY_CARE_PROVIDER_SITE_OTHER): Payer: Medicare HMO | Admitting: Nurse Practitioner

## 2023-05-16 VITALS — BP 136/82 | HR 89 | Temp 98.1°F | Ht 63.5 in | Wt 156.6 lb

## 2023-05-16 DIAGNOSIS — E538 Deficiency of other specified B group vitamins: Secondary | ICD-10-CM

## 2023-05-16 DIAGNOSIS — R7989 Other specified abnormal findings of blood chemistry: Secondary | ICD-10-CM | POA: Diagnosis not present

## 2023-05-16 DIAGNOSIS — J060 Acute laryngopharyngitis: Secondary | ICD-10-CM | POA: Diagnosis not present

## 2023-05-16 DIAGNOSIS — R5383 Other fatigue: Secondary | ICD-10-CM

## 2023-05-16 HISTORY — DX: Acute laryngopharyngitis: J06.0

## 2023-05-16 LAB — GLUCOSE, POCT (MANUAL RESULT ENTRY): POC Glucose: 100 mg/dL — AB (ref 70–99)

## 2023-05-16 NOTE — Telephone Encounter (Signed)
 Copied from CRM [id number]. Topic: General - Call Back - No Documentation >> May 15, 2023  4:30 PM Jayson Michael wrote: -Reason for CRM: patient returning missed call from tonya hutchins please call patient back at [phone] -

## 2023-05-16 NOTE — Assessment & Plan Note (Addendum)
 Noted during urgent care visit on 05/14/23 No nausea or ABDOMEN pain or weight loss  Repeat hepatic panel (fasting)

## 2023-05-16 NOTE — Telephone Encounter (Signed)
 FYI pt has appt today for dizziness at another location but she is also asking if she should have labs prior to appointment 05/30/2023

## 2023-05-16 NOTE — Patient Instructions (Addendum)
 Schedule fasting lab appointment for next week. Need to be fasting 8hrs prior to blood draw.  Ok to drink water and take thyroid  med.  Start pepcid/famotidine 20mg  in AM and claritin  10mg  in PM x 1week. Stop these meds if no improvement in 1week. Discuss need for ENT referral with pcp if above meds do not help with sorethroat.  For Fatigue, this is due to post COVID syndrome. Allow time for symptoms to improve. Maintain adequate oral hydration, heart healthy diet, rest and daily exercise.  Schedule a transfer of care appointment if you choice to change pcp.

## 2023-05-16 NOTE — Progress Notes (Signed)
 Acute Office Visit  Subjective:    Patient ID: Christine Valdez, female    DOB: 03-04-1951, 73 y.o.   MRN: [id number]  Chief Complaint  Patient presents with   Dizziness    Sore throat, fatigue, headache, headache went to urgent care 2 days ago    Sore throat and laryngitis Onset post COVID infection in December. Denies any heart burn of dysphagia.  Start pepcid/famotidine 20mg  in AM and claritin  10mg  in PM x 1week. Stop these meds if no improvement in 1week. Discuss need for ENT referral with pcp if above meds do not help with sorethroat.  Elevated LFTs Noted during urgent care visit on 05/14/23 No nausea or ABDOMEN pain or weight loss  Repeat hepatic panel (fasting)  Other fatigue Onset after COVID infection in December. Associated with malaise, dizziness, post nasal drainage and sore throat. Denies any GI/GU symptoms, no rash Some improvement with ibuprofen 400mg  BID prn. Citalopram  switched to zoloft   6-22months ago by [facility]. No dose adjustment in last 5months. Use of restoril  prn. Has not used recently. Use of ativan  prn, has not used recently.  Labs completed by urgent care provider 05/14/2023: normal CBC, renal function, and THYROID . Negative strep and mono test. 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 F/up with pcp    Outpatient Medications Prior to Visit  Medication Sig   albuterol  (VENTOLIN  HFA) 108 (90 Base) MCG/ACT inhaler INHALE 2 PUFFS INTO THE LUNGS EVERY 6 HOURS AS NEEDED FOR SHORTNESS OF BREATH   cholecalciferol  (VITAMIN D ) 1000 UNITS tablet Take 1,000 Units by mouth daily.   docusate sodium  (COLACE) 100 MG capsule Take 400 mg by mouth at bedtime.    fluticasone  (FLONASE ) 50 MCG/ACT nasal spray USE 1-2 SPRAYS INTO EACH NOSTRIL ONCE A DAY AS NEEDED   levothyroxine  (SYNTHROID ) 75 MCG tablet TAKE 1 TABLET BY MOUTH EVERY DAY   loratadine  (CLARITIN ) 10 MG tablet Take 10 mg by mouth daily as  needed for allergies.    LORazepam  (ATIVAN ) 0.5 MG tablet TAKE 1 TABLET (0.5 MG TOTAL) BY MOUTH EVERY 8 (EIGHT) HOURS.   sertraline  (ZOLOFT ) 100 MG tablet Take two tablets daily.   temazepam  (RESTORIL ) 30 MG capsule TAKE 1 CAPSULE BY MOUTH EVERY DAY AT BEDTIME AS NEEDED   vitamin B-12 (CYANOCOBALAMIN ) 1000 MCG tablet Take 1,000 mcg by mouth every other day.    [DISCONTINUED] citalopram  (CELEXA ) 40 MG tablet Take 1 tablet (40 mg total) by mouth daily.   [DISCONTINUED] gabapentin  (NEURONTIN ) 300 MG capsule Take 1 capsule (300 mg total) by mouth at bedtime.   [DISCONTINUED] Baclofen  5 MG TABS Take 1 tablet (5 mg total) by mouth 2 (two) times daily as needed. (Patient not taking: Reported on 05/16/2023)   No facility-administered medications prior to visit.    Reviewed past medical and social history.  Review of Systems  Neurological:  Positive for dizziness.   Per HPI     Objective:    Physical Exam Vitals and nursing note reviewed.  HENT:     Right Ear: Hearing, tympanic membrane, ear canal and external ear normal.     Left Ear: Hearing, tympanic membrane, ear canal and external ear normal.     Nose: Nose normal.     Mouth/Throat:     Mouth: Mucous membranes are moist.     Pharynx: Oropharynx is clear. Uvula midline. Posterior oropharyngeal erythema and postnasal drip present. No oropharyngeal exudate or uvula swelling.     Tonsils:  No tonsillar exudate or tonsillar abscesses.  Cardiovascular:     Rate and Rhythm: Normal rate and regular rhythm.     Pulses: Normal pulses.     Heart sounds: Normal heart sounds.  Pulmonary:     Effort: Pulmonary effort is normal.     Breath sounds: Normal breath sounds.  Musculoskeletal:     Cervical back: Normal range of motion and neck supple.     Right lower leg: No edema.     Left lower leg: No edema.  Lymphadenopathy:     Cervical: No cervical adenopathy.  Skin:    General: Skin is warm and dry.     Findings: No rash.  Neurological:      Mental Status: She is alert and oriented to person, place, and time.    BP 136/82 (BP Location: Right Arm, Patient Position: Sitting, Cuff Size: Normal)   Pulse 89   Temp 98.1 F (36.7 C)   Ht 5' 3.5 (1.613 m)   Wt 156 lb 9.6 oz (71 kg)   LMP 04/11/2003 (Approximate)   SpO2 95%   BMI 27.31 kg/m    Results for orders placed or performed in visit on 05/16/23  POCT Glucose (CBG)  Result Value Ref Range   POC Glucose 100 (A) 70 - 99 mg/dl      Assessment & Plan:   Problem List Items Addressed This Visit     B12 deficiency   Relevant Orders   B12   Elevated LFTs   Noted during urgent care visit on 05/14/23 No nausea or ABDOMEN pain or weight loss  Repeat hepatic panel (fasting)      Relevant Orders   Hepatic function panel   Other fatigue - Primary   Onset after COVID infection in December. Associated with malaise, dizziness, post nasal drainage and sore throat. Denies any GI/GU symptoms, no rash Some improvement with ibuprofen 400mg  BID prn. Citalopram  switched to zoloft   6-15months ago by [facility]. No dose adjustment in last 5months. Use of restoril  prn. Has not used recently. Use of ativan  prn, has not used recently.  Labs completed by urgent care provider 05/14/2023: normal CBC, renal function, and THYROID . Negative strep and mono test. 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 F/up with pcp        Relevant Orders   VITAMIN D  25 Hydroxy (Vit-D Deficiency, Fractures)   POCT Glucose (CBG) (Completed)   CK   C-reactive protein   Sedimentation rate   Sore throat and laryngitis   Onset post COVID infection in December. Denies any heart burn of dysphagia.  Start pepcid/famotidine 20mg  in AM and claritin  10mg  in PM x 1week. Stop these meds if no improvement in 1week. Discuss need for ENT referral with pcp if above meds do not help with sorethroat.      No orders of the defined types were placed in this  encounter.  Return if symptoms worsen or fail to improve.    Roselie Mood, NP

## 2023-05-16 NOTE — Assessment & Plan Note (Signed)
 Onset post COVID infection in December. Denies any heart burn of dysphagia.  Start pepcid/famotidine 20mg  in AM and claritin  10mg  in PM x 1week. Stop these meds if no improvement in 1week. Discuss need for ENT referral with pcp if above meds do not help with sorethroat.

## 2023-05-16 NOTE — Assessment & Plan Note (Addendum)
 Onset after COVID infection in December. Associated with malaise, dizziness, post nasal drainage and sore throat. Denies any GI/GU symptoms, no rash Some improvement with ibuprofen 400mg  BID prn. Citalopram  switched to zoloft   6-56months ago [facility]. No dose adjustment in last 5months. Use of restoril  prn. Has not used recently. Use of ativan  prn, has not used recently.  Labs completed by urgent care provider 05/14/2023: normal CBC, renal function, and THYROID . Negative strep and mono test. 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 F/up with pcp

## 2023-05-17 LAB — CULTURE, GROUP A STREP (THRC)

## 2023-05-17 NOTE — Telephone Encounter (Signed)
 Looks like she has some recent labs including thyroid  testing that I would typically check.  There were some other labs ordered by provider yesterday, appear to be future ordered.  She can certainly have those performed prior to my visit but I would not add anything else for now until discuss further at office visit.  Thanks

## 2023-05-18 NOTE — Telephone Encounter (Signed)
 Looks like E2C2 scheduled her a lab visit

## 2023-05-23 ENCOUNTER — Other Ambulatory Visit (INDEPENDENT_AMBULATORY_CARE_PROVIDER_SITE_OTHER): Payer: Medicare HMO

## 2023-05-23 ENCOUNTER — Encounter: Payer: Self-pay | Admitting: Nurse Practitioner

## 2023-05-23 DIAGNOSIS — R5383 Other fatigue: Secondary | ICD-10-CM | POA: Diagnosis not present

## 2023-05-23 DIAGNOSIS — E538 Deficiency of other specified B group vitamins: Secondary | ICD-10-CM

## 2023-05-23 DIAGNOSIS — R7989 Other specified abnormal findings of blood chemistry: Secondary | ICD-10-CM | POA: Diagnosis not present

## 2023-05-23 LAB — CK: Total CK: 32 U/L (ref 7–177)

## 2023-05-23 LAB — HEPATIC FUNCTION PANEL
ALT: 77 U/L — ABNORMAL HIGH (ref 0–35)
AST: 83 U/L — ABNORMAL HIGH (ref 0–37)
Albumin: 4.3 g/dL (ref 3.5–5.2)
Alkaline Phosphatase: 53 U/L (ref 39–117)
Bilirubin, Direct: 0.1 mg/dL (ref 0.0–0.3)
Total Bilirubin: 0.7 mg/dL (ref 0.2–1.2)
Total Protein: 6.7 g/dL (ref 6.0–8.3)

## 2023-05-23 LAB — C-REACTIVE PROTEIN: CRP: <1 mg/dL (ref 0.5–20.0)

## 2023-05-23 LAB — VITAMIN B12: Vitamin B-12: 1327 pg/mL — ABNORMAL HIGH (ref 211–911)

## 2023-05-23 LAB — VITAMIN D 25 HYDROXY (VIT D DEFICIENCY, FRACTURES): VITD: 41.26 ng/mL (ref 30.00–100.00)

## 2023-05-23 LAB — SEDIMENTATION RATE: Sed Rate: 5 mm/h (ref 0–30)

## 2023-05-23 NOTE — Telephone Encounter (Signed)
Copied from CRM [id number]. Topic: Clinical - Lab/Test Results >> May 23, 2023  2:20 PM Eunice Blase wrote: Reason for CRM: Pt called needs someone to call her to go over note regarding graph says abnormal but has a question. Please call pt  [phone]

## 2023-05-30 ENCOUNTER — Ambulatory Visit: Payer: Self-pay | Admitting: Family Medicine

## 2023-05-30 ENCOUNTER — Ambulatory Visit: Payer: Medicare HMO | Admitting: Family Medicine

## 2023-05-30 ENCOUNTER — Telehealth (INDEPENDENT_AMBULATORY_CARE_PROVIDER_SITE_OTHER): Payer: Medicare HMO | Admitting: Family Medicine

## 2023-05-30 ENCOUNTER — Encounter: Payer: Self-pay | Admitting: Family Medicine

## 2023-05-30 DIAGNOSIS — R42 Dizziness and giddiness: Secondary | ICD-10-CM

## 2023-05-30 DIAGNOSIS — U071 COVID-19: Secondary | ICD-10-CM

## 2023-05-30 DIAGNOSIS — G4452 New daily persistent headache (NDPH): Secondary | ICD-10-CM

## 2023-05-30 DIAGNOSIS — R7989 Other specified abnormal findings of blood chemistry: Secondary | ICD-10-CM

## 2023-05-30 DIAGNOSIS — J029 Acute pharyngitis, unspecified: Secondary | ICD-10-CM

## 2023-05-30 DIAGNOSIS — R5383 Other fatigue: Secondary | ICD-10-CM | POA: Diagnosis not present

## 2023-05-30 NOTE — Telephone Encounter (Addendum)
Chief Complaint: Ongoing headaches Symptoms: headaches ranging 8/10 pain Frequency: since Covid in December Pertinent Negatives: n/a Disposition: [] ED /[] Urgent Care (no appt availability in office) / [x] Appointment(In office/virtual)/ []  [facility]/ [] Home Care/ [] Refused Recommended Disposition /[] [facility]/ []  Follow-up with PCP Additional Notes: Patient called in stating she would like to follow up on the severe headaches she has been experiencing since Covid in December. Patient was seen and evaluated by Alysia Penna NP and was prescribed some instructions to help with headaches. Patient has tried changing diet, increased sunlight, hydration, ibuprofen, but no relief. Patient states headaches are not every day but when they do come on they are very intense. Patient denies vomiting with headaches, but states she has sensitivity to light. Patient is curious about PRN medication to control the headaches when they occur, or potentially referral to pain management to help. Patient appt created today to discuss virtually with PCP.   Copied from CRM [id number]. Topic: Clinical - Red Word Triage >> May 30, 2023 11:38 AM Truddie Crumble wrote: Reason for CRM: patient called stating she is having headaches, dizziness, fatigue, sore throat and she had covid in December. Patient want to see if the provider can give her something stronger for the headaches. Patient saw NP Nche on 2/5 for these symptoms Reason for Disposition  Headache is a chronic symptom (recurrent or ongoing AND present > 4 weeks)  Answer Assessment - Initial Assessment Questions 1. LOCATION: "Where does it hurt?"      Right behind eyes 2. ONSET: "When did the headache start?" (Minutes, hours or days)      Intermittent since December with Covid 3. PATTERN: "Does the pain come and go, or has it been constant since it started?"     Comes and goes 4. SEVERITY: "How bad is the pain?" and "What does it keep you from  doing?"  (e.g., Scale 1-10; mild, moderate, or severe)   - MILD (1-3): doesn't interfere with normal activities    - MODERATE (4-7): interferes with normal activities or awakens from sleep    - SEVERE (8-10): excruciating pain, unable to do any normal activities        8  5. RECURRENT SYMPTOM: "Have you ever had headaches before?" If Yes, ask: "When was the last time?" and "What happened that time?"      Since December  6. CAUSE: "What do you think is causing the headache?"     Unsure if [employer] covid 7. MIGRAINE: "Have you been diagnosed with migraine headaches?" If Yes, ask: "Is this headache similar?"      No,, only 1 migraine in life 8. HEAD INJURY: "Has there been any recent injury to the head?"      No 9. OTHER SYMPTOMS: "Do you have any other symptoms?" (fever, stiff neck, eye pain, sore throat, cold symptoms)     Sensitivity to light, dizziness  Protocols used: Headache-A-AH

## 2023-05-30 NOTE — Patient Instructions (Signed)
Sorry to hear that you have not been feeling right since December.  This all could be related to the initial viral infection but lets look into a couple of different possibilities.   For the elevated liver tests I will repeat those and check an acute hepatitis panel to look for possible infection, and have ordered an ultrasound.  Please have labs at the [facility] location below.  They will call you to schedule the ultrasound.  With the new headaches, I think a CT would be reasonable given the timing of those headaches but expect that to look okay.  The CT scan may indicate some sinus issues but we can always have you see ENT if needed or other imaging depending on the initial CT head results.  I think it would be reasonable to try the Flonase nasal spray daily to see if some of the congestion could be related to headaches and could be causing some of the postnasal drip contributing to sore throat.  Follow-up with me next week and at that point we can review these results and decide if a visit with physical medicine and rehab for possible post-COVID or ENT for headaches may be needed.  Hang in there.  Dr. Neva Seat  Return to the clinic or go to the nearest emergency room if any of your symptoms worsen or new symptoms occur.  [facility] or xray: Walk in 8:30-4:30 during weekdays, no appointment needed [address]  [place], [place] [address]

## 2023-05-30 NOTE — Telephone Encounter (Signed)
FYI patient added at 2:00pm for virtual

## 2023-05-30 NOTE — Progress Notes (Signed)
Virtual Visit via Video Note  I connected with Christine Valdez on 05/30/23 at 2:09 PM (after 8 min chart review) by a video enabled telemedicine application and verified that I am speaking with the correct person using two identifiers.  Patient location: at friends home, by self.  My location: office - [place].    I discussed the limitations, risks, security and privacy concerns of performing an evaluation and management service by telephone and the availability of in person appointments. I also discussed with the patient that there may be a patient responsible charge related to this service. The patient expressed understanding and agreed to proceed, consent obtained  Chief complaint:  Chief Complaint  Patient presents with   Headache    Pt notes headache starting after COVID infection in December and has been trying to resolve this and notes [facility] saw her and didn't get a lot of results from that, Ibuprofen does not touch it  Dizziness, headaches, sore throat and Fatigue since December     History of Present Illness: Christine Valdez is a 72 y.o. female  Headaches: As above, recurrent headaches since COVID infection December.  Early December when visiting son in [place], positive home test. Initial symptoms of fatigue, HA, cough, urinary tract infection symptoms and back pain. Treated with tramadol, ultram, prednisone for back pain in [place] on 12/5 - before she knew she had covid. Treated for cystitis at local urgent care 03/25/23 with keflex BID for 7 days,  urine culture no growth, but completed course of abx- urinary symptoms improved. 04/02/23 urgent care eval  - R hip pain, R low back pain. Rx baclofen and gabapentin - some relief.  04/18/23 - visit with Dr. Charlann Boxer at ortho. Has steroid injection. Some relief. Stopped gabapentin and baclofen.   Urgent care visit noted from 05/14/2023 with sore throat and fatigue.  Negative rapid strep test as well as  strep culture..  Negative mono testing.  Glucose low at 56, AST, ALT elevated at 87, 78, previously 22/14 in 2023.  She was seen by NP Nche 2 days later on 05/16/23.  Repeat blood work, other blood work obtained and differential with possible post-COVID syndrome.  For sore throat, laryngitis symptoms she was started on Pepcid 20 mg in the morning and Claritin 10 mg in the evening with option of ENT referral if no improvement.  Point-of-care glucose 100.  Repeat labs were performed 1 week ago.  Normal sed rate and C-reactive protein, normal CK, B12 elevated at 1327, recommended stopping B12 supplements ( she did stop supplement).  Normal vitamin D.  Similar LFTs with AST 83, ALT 77.  HA, Sore throat, dizziness, fatigue has persisted since covid infection in early December. Frontal HA, behind the eyes. No vision changes. Slight nasal congestion, but no discolored nasal discharge.  When she stands up she feels lightheaded feeling, not room spinning. No facial droop, focal weakness or speech difficulty.  No syncope. No CP/palpitations. No recent fever. Some initial abdominal pain in December, not recent. No n/v.  Eating and drinking ok.  Daily headache - sometimes lasts all day. HA in center of head. No recent migraines - none in years.  No change in sore throat with pepcid, claritin, able to clear secretions and drink fluids. Flonase only as needed - intermittent with allergies.  ENT - Dr. Aleene Davidson for recurrent sinus infections.   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/04/2013   Past Medical History:  Diagnosis Date   Allergy    uses Flonase daily as needed and takes Loratadine daily    Anemia    only after birth of 2nd child   Arthritis    Asthma    uses Albuterol daily as needed;exercise induced   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, right cheek   Depression    takes Celexa daily   Depression    Phreesia 07/01/2020   GERD (gastroesophageal reflux disease)    occ   History of colon polyps    History of shingles    5-6 yrs   Hyperlipidemia    Hypothyroid    Insomnia    takes Restoril nightly as needed   Interstitial cystitis    Osteoarthritis    Sjogren's syndrome (HCC)    Spinal headache    after 1st C/S, o2sat low after shoulder surgery after went to room(51%),hard to urinate)   Thyroid disease    Phreesia 09/21/2019   Past Surgical History:  Procedure Laterality Date   CARPAL TUNNEL RELEASE Right 05/2007   CESAREAN SECTION  1981/1984   x 2   COLONOSCOPY     JOINT REPLACEMENT N/A    Phreesia 09/21/2019   TONSILLECTOMY AND ADENOIDECTOMY  age 7   TOTAL SHOULDER ARTHROPLASTY Right 07/04/2013   DR NORRIS   TOTAL SHOULDER ARTHROPLASTY Right 07/04/2013   Procedure: RIGHT TOTAL SHOULDER ARTHROPLASTY;  Surgeon: Verlee Rossetti, MD;  Location: [facility] OR;  Service: Orthopedics;  Laterality: Right;   TOTAL SHOULDER ARTHROPLASTY Left 03/12/2015   Procedure: LEFT SHOULDER TOTAL SHOULDER ARTHROPLASTY;  Surgeon: Beverely Low, MD;  Location: [facility] OR;  Service: Orthopedics;  Laterality: Left;   Allergies  Allergen Reactions   Diflucan [Fluconazole] Hives    unknown   Mercury Hives   Thimerosal (Thiomersal) Hives   Haemophilus B Polysaccharide Vaccine Rash    Mercury component in flu vaccine, Mercury component in flu vaccine, Mercury component and Thierosal in flu vaccine  Mercury component in flu vaccine, Mercury  component in flu vaccine, Mercury component and Thierosal in flu vaccine    Mercury component in flu vaccine Mercury component in flu vaccine Mercury component and Thierosal in flu vaccine   Sulfur Rash and Other (See Comments)   Mercury Detox [Nutritional Supplements] Hives   Influenza Vaccine Recombinant Rash    Mercury component and Thierosal in flu vaccine   Levaquin [Levofloxacin] Rash   Sulfa Antibiotics Rash   Prior to Admission medications   Medication Sig Start Date End Date Taking? Authorizing Provider  albuterol (VENTOLIN HFA) 108 (90 Base) MCG/ACT inhaler INHALE 2 PUFFS INTO THE LUNGS EVERY 6 HOURS AS NEEDED FOR SHORTNESS OF BREATH 05/23/22  Yes Shade Flood, MD  cholecalciferol (VITAMIN D) 1000 UNITS tablet Take 1,000 Units by mouth daily.   Yes [provider]  docusate sodium (COLACE) 100 MG capsule Take 400 mg by mouth at bedtime.    Yes [provider]  fluticasone (FLONASE) 50 MCG/ACT nasal spray USE  1-2 SPRAYS INTO EACH NOSTRIL ONCE A DAY AS NEEDED 06/05/19  Yes Collie Siad A, MD  levothyroxine (SYNTHROID) 75 MCG tablet TAKE 1 TABLET BY MOUTH EVERY DAY 05/23/22  Yes Shade Flood, MD  loratadine (CLARITIN) 10 MG tablet Take 10 mg by mouth daily as needed for allergies.    Yes [provider]  LORazepam (ATIVAN) 0.5 MG tablet TAKE 1 TABLET (0.5 MG TOTAL) BY MOUTH EVERY 8 (EIGHT) HOURS. 04/23/23  Yes Mozingo, Thereasa Solo, NP  sertraline (ZOLOFT) 100 MG tablet Take two tablets daily. 11/09/22  Yes Mozingo, Thereasa Solo, NP  temazepam (RESTORIL) 30 MG capsule TAKE 1 CAPSULE BY MOUTH EVERY DAY AT BEDTIME AS NEEDED 01/24/23  Yes Shade Flood, MD  vitamin B-12 (CYANOCOBALAMIN) 1000 MCG tablet Take 1,000 mcg by mouth every other day.    Yes [provider]   Social History   Socioeconomic History   Marital status: Married    Spouse name: Not on file   Number of children: 2   Years of education: Not on file   Highest  education level: Not on file  Occupational History   Not on file  Tobacco Use   Smoking status: Never   Smokeless tobacco: Never  Vaping Use   Vaping status: Never Used  Substance and Sexual Activity   Alcohol use: Yes    Alcohol/week: 2.0 standard drinks of alcohol    Types: 2 Glasses of wine per week   Drug use: No   Sexual activity: Not Currently    Partners: Male    Birth control/protection: Post-menopausal  Other Topics Concern   Not on file  Social History Narrative   Work or School: nanny for multiple families      Home Situation: son a Education officer, environmental      Spiritual Beliefs: Christian      Lifestyle: no regular exercise, diet not great   Social Drivers of Corporate investment banker Strain: Not on file  Food Insecurity: Not on file  Transportation Needs: Not on file  Physical Activity: Not on file  Stress: Not on file  Social Connections: Not on file  Intimate Partner Violence: Not on file    Observations/Objective: There were no vitals filed for this visit. Nontoxic appearance on video. Marland Kitchen  Appropriate responses.  No respiratory distress, no audible wheeze or stridor.  Modified neuroexam over video, no apparent nystagmus with having her left left and right, up and down.  No change in dizziness with this testing. Speaking in full sentences, equal facial movements, no facial droop.  Normal speech.    Assessment and Plan: New persistent daily headache - Plan: CT HEAD WO CONTRAST ( )  COVID-19 virus infection  Fatigue, unspecified type - Plan: Comprehensive metabolic panel  Lightheadedness - Plan: CT HEAD WO CONTRAST ( )  Elevated LFTs - Plan: Comprehensive metabolic panel, Hepatitis, Acute, US Abdomen Limited RUQ (LIVER/GB)  Elevated vitamin B12 level - Plan: B12  Sore throat  Persistent daily headache, fatigue, intermittent dizziness, sore throat since COVID infection approximately 2 months ago.  Also with elevated LFTs, possible transaminitis from viral  infection, stable but persistent elevations on most recent testing.  Some previous sinus issues, potentially could have sinus congestion that is contributing to headaches and postnasal drip with sore throat.  -With new daily headache, sometimes severe headache will check CT head.  This may give [place] a window into some of her sinuses as well to see if there are significant air-fluid levels or sinus  disease.  -Check CMP for repeat LFTs as well as acute hepatitis panel.  Ultrasound right upper quadrant also to evaluate liver with these elevated LFTs.  -Trial of Flonase nasal spray daily to see if that helps with congestion, postnasal drip, sore throat.  -In office eval in 1 week.  Consider referral to ENT at that time.  Consider referral to physical medicine and rehab if this looks like it could be post-COVID syndrome to discuss different treatment options.  Follow Up Instructions:    I discussed the assessment and treatment plan with the patient. The patient was provided an opportunity to ask questions and all were answered. The patient agreed with the plan and demonstrated an understanding of the instructions.   The patient was advised to call back or seek an in-person evaluation if the symptoms worsen or if the condition fails to improve as anticipated.   Shade Flood, MD

## 2023-05-31 ENCOUNTER — Telehealth: Payer: Self-pay | Admitting: Family Medicine

## 2023-06-04 ENCOUNTER — Other Ambulatory Visit: Payer: Medicare HMO

## 2023-06-04 DIAGNOSIS — R5383 Other fatigue: Secondary | ICD-10-CM | POA: Diagnosis not present

## 2023-06-04 DIAGNOSIS — R7989 Other specified abnormal findings of blood chemistry: Secondary | ICD-10-CM | POA: Diagnosis not present

## 2023-06-04 DIAGNOSIS — G4452 New daily persistent headache (NDPH): Secondary | ICD-10-CM | POA: Diagnosis not present

## 2023-06-04 LAB — COMPREHENSIVE METABOLIC PANEL
ALT: 60 U/L — ABNORMAL HIGH (ref 0–35)
AST: 61 U/L — ABNORMAL HIGH (ref 0–37)
Albumin: 4.5 g/dL (ref 3.5–5.2)
Alkaline Phosphatase: 46 U/L (ref 39–117)
BUN: 17 mg/dL (ref 6–23)
CO2: 27 meq/L (ref 19–32)
Calcium: 10 mg/dL (ref 8.4–10.5)
Chloride: 102 meq/L (ref 96–112)
Creatinine, Ser: 0.84 mg/dL (ref 0.40–1.20)
GFR: 70.31 mL/min (ref >60.00)
Glucose, Bld: 102 mg/dL — ABNORMAL HIGH (ref 70–99)
Potassium: 4.1 meq/L (ref 3.5–5.1)
Sodium: 139 meq/L (ref 135–145)
Total Bilirubin: 0.6 mg/dL (ref 0.2–1.2)
Total Protein: 7.1 g/dL (ref 6.0–8.3)

## 2023-06-04 LAB — VITAMIN B12: Vitamin B-12: 787 pg/mL (ref 211–911)

## 2023-06-05 ENCOUNTER — Ambulatory Visit
Admission: RE | Admit: 2023-06-05 | Discharge: 2023-06-05 | Disposition: A | Discharge status: Home / Self Care | Payer: Medicare HMO | Source: Ambulatory Visit | Attending: Family Medicine | Admitting: Family Medicine

## 2023-06-05 ENCOUNTER — Encounter: Payer: Self-pay | Admitting: Family Medicine

## 2023-06-05 DIAGNOSIS — K760 Fatty (change of) liver, not elsewhere classified: Secondary | ICD-10-CM | POA: Diagnosis not present

## 2023-06-05 DIAGNOSIS — G4452 New daily persistent headache (NDPH): Secondary | ICD-10-CM | POA: Diagnosis not present

## 2023-06-05 DIAGNOSIS — R7989 Other specified abnormal findings of blood chemistry: Secondary | ICD-10-CM

## 2023-06-05 DIAGNOSIS — R42 Dizziness and giddiness: Secondary | ICD-10-CM

## 2023-06-05 LAB — HEPATITIS PANEL, ACUTE
Hep A IgM: NONREACTIVE
Hep B C IgM: NONREACTIVE
Hepatitis B Surface Ag: NONREACTIVE
Hepatitis C Ab: NONREACTIVE

## 2023-06-06 ENCOUNTER — Encounter: Payer: Self-pay | Admitting: Family Medicine

## 2023-06-06 ENCOUNTER — Ambulatory Visit (INDEPENDENT_AMBULATORY_CARE_PROVIDER_SITE_OTHER): Payer: Medicare HMO | Admitting: Family Medicine

## 2023-06-06 VITALS — BP 130/80 | HR 87 | Temp 98.4°F | Ht 63.0 in | Wt 159.0 lb

## 2023-06-06 DIAGNOSIS — G4452 New daily persistent headache (NDPH): Secondary | ICD-10-CM | POA: Diagnosis not present

## 2023-06-06 DIAGNOSIS — Z8669 Personal history of other diseases of the nervous system and sense organs: Secondary | ICD-10-CM

## 2023-06-06 DIAGNOSIS — R7989 Other specified abnormal findings of blood chemistry: Secondary | ICD-10-CM

## 2023-06-06 DIAGNOSIS — R5383 Other fatigue: Secondary | ICD-10-CM | POA: Diagnosis not present

## 2023-06-06 DIAGNOSIS — U071 COVID-19: Secondary | ICD-10-CM

## 2023-06-06 MED ORDER — PREDNISONE 20 MG PO TABS: ORAL_TABLET | ORAL | 0 refills | Status: DC

## 2023-06-06 NOTE — Progress Notes (Signed)
 Subjective:  Patient ID: Christine Valdez, female    DOB: February 28, 1951  Age: 73 y.o. MRN: [id number]  CC:  Chief Complaint  Patient presents with   Fatigue    Pt states all she wants to do it sleep, elevated liver test as well.    Headache    OTC medications are not helping, Headaches off and on since having covid in December. Pt states her headaches are in the center of head did have a CT scan done yesterday. No light sensitivity but does state she gets dizzy, Pt states " she feels off".     HPI Christine Valdez presents for   Fatigue, headaches: Follow-up from virtual visit 1 week ago.  COVID infection in December in [place].  Treated with tramadol, prednisone for back pain prior to COVID infection diagnosis.  Subsequent treatment for cystitis at local urgent care December 15 with Keflex..  Treated again for back pain, hip pain December 23 with baclofen, gabapentin.  Visit with Dr. Charlann Boxer with orthopedics 04/18/2023 with steroid injection with some relief of hip, back symptoms and was able to stop gabapentin and baclofen.  Urgent care visit February 3 with sore throat and fatigue with negative rapid strep testing and mono testing.  She did have an elevated AST, ALT at 87, 78 with previous 22/14 in 2023.  Repeat blood work ordered on February 5 visit, discussed possible post-COVID syndrome at that time, started on Pepcid and Claritin for sore throat, laryngitis symptoms.  Elevated B12 at 1327, stop supplement.  Normal vitamin D and AST, ALT were similar at 83/77. Headache, sore throat and dizziness along with fatigue had persisted since her COVID infection in December.  Frontal headache behind the eyes but no vision changes.  Slight nasal congestion, episodic lightheadedness discussed at her video visit last week.  No focal weakness.Daily headache, different than previous migraines and had not had a migraine in years.  No change in her sore throat with previous Pepcid, Claritin treatment, had  been using Flonase only as needed.  We discussed elevated LFTs could be transaminitis from viral infection, prior COVID infection.  Repeat LFTs 2 days ago were downtrending at AST/ALT 61/60 and negative acute hepatitis panel. Abdominal ultrasound performed yesterday with mild diffuse fatty liver changes. Repeat B12 was normal at 787.  We recommended trying Flonase nasal spray daily to see if that would help with congestion, postnasal drip, sore throat and possibly with headaches. CT head was also obtained given new daily headache, no acute findings, unremarkable noncontrast CT appearance of the brain for age and no evidence of acute intracranial abnormality.  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  No significant paranasal sinus disease at the imaged levels was identified.  Daily flonase ns has helped some with drainage. No change in headache. Still severe HA at times. Feels worse than prior migraine. No migraines in over 10 years. HA still comes and goes. Center of head, not in eyes, no eye pain. No recent vision changes, no amaurosis fugax. HA almost every day. Photophobia at times, not currently. No phonophobia. No neck stiffness.  No fever. Fatigue has persisted since mid December.  Eating and drinking normally.  No chest pain, palpitations of dyspnea.  Sleeping ok.   Lab Results  Component Value Date   TSH 2.571 05/14/2023   Lab Results  Component Value Date   WBC 5.2 05/14/2023   HGB 13.9 05/14/2023   HCT 41.9 05/14/2023   MCV 89.1 05/14/2023  PLT 155 05/14/2023    Tx: ibuprofen - up to 600 mg up to every 6 hours, no relief.    Depressed, frustrated with how she is feeling. She denies any suicidal thoughts, intent or plan.     06/06/2023   11:53 AM 01/19/2022    3:29 PM 12/19/2021    2:32 PM 07/04/2021    2:28 PM 06/29/2021    4:45 PM  Depression screen PHQ 2/9  Decreased Interest 3 0 0 3 3  Down, Depressed, Hopeless 3 0 0 2 3  PHQ - 2 Score 6 0 0 5 6  Altered  sleeping 3 0  3 3  Tired, decreased energy 3 0  3 3  Change in appetite 2 0  2 3  Feeling bad or failure about yourself  0 0  0 0  Trouble concentrating 1 0  0 0  Moving slowly or fidgety/restless 0 0  0 0  Suicidal thoughts 3 0  0 0  PHQ-9 Score 18 0  13 15  Difficult doing work/chores Somewhat difficult    Extremely dIfficult      History Patient Active Problem List   Diagnosis Date Noted   Other fatigue 05/16/2023   Elevated LFTs 05/16/2023   Sore throat and laryngitis 05/16/2023   Genetic testing 04/11/2022   Family history of breast cancer 03/23/2022   Recurrent major depressive disorder, in partial remission (HCC) 09/28/2016   Hypothyroidism 09/28/2016   B12 deficiency 09/28/2016   Hyperglycemia 09/28/2016   Pruritus ani 06/25/2015   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   Anxiety 02/04/2015   Major depression, recurrent (HCC) 02/04/2015   Mild intermittent asthma without complication 04/21/2014   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/04/2013   Past Medical History:  Diagnosis Date   Allergy    uses Flonase daily as needed and takes Loratadine daily    Anemia    only after birth of 2nd child   Arthritis    Asthma    uses Albuterol daily as needed;exercise induced   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, right cheek   Depression    takes Celexa daily   Depression    [employer]reesia 07/01/2020   GERD (gastroesophageal reflux disease)    occ   History of colon polyps    History of shingles    5-6 yrs   Hyperlipidemia    Hypothyroid    Insomnia    takes Restoril nightly as needed   Interstitial cystitis    Osteoarthritis    Sjogren's syndrome (HCC)    Spinal headache    after 1st C/S, o2sat low after shoulder surgery after went to room(51%),hard to urinate)   Thyroid disease    [employer]reesia 09/21/2019   Past Surgical History:  Procedure Laterality Date   CARPAL TUNNEL RELEASE Right 05/2007   CESAREAN SECTION  1981/1984   x 2   COLONOSCOPY     JOINT REPLACEMENT N/A     [employer]reesia 09/21/2019   TONSILLECTOMY AND ADENOIDECTOMY  age 73   TOTAL SHOULDER ARTHROPLASTY Right 07/04/2013   DR NORRIS   TOTAL SHOULDER ARTHROPLASTY Right 07/04/2013   Procedure: RIGHT TOTAL SHOULDER ARTHROPLASTY;  Surgeon: Verlee Rossetti, MD;  Location: [facility] OR;  Service: Orthopedics;  Laterality: Right;   TOTAL SHOULDER ARTHROPLASTY Left 03/12/2015   Procedure: LEFT SHOULDER TOTAL SHOULDER ARTHROPLASTY;  Surgeon: Beverely Low, MD;  Location: [facility] OR;  Service: Orthopedics;  Laterality: Left;   Allergies  Allergen Reactions   Diflucan [Fluconazole] Hives    unknown  Mercury Hives   Thimerosal (Thiomersal) Hives   Haemophilus B Polysaccharide Vaccine Rash    Mercury component in flu vaccine, Mercury component in flu vaccine, Mercury component and Thierosal in flu vaccine  Mercury component in flu vaccine, Mercury component in flu vaccine, Mercury component and Thierosal in flu vaccine    Mercury component in flu vaccine Mercury component in flu vaccine Mercury component and Thierosal in flu vaccine   Sulfur Rash and Other (See Comments)   Mercury Detox [Nutritional Supplements] Hives   Influenza Vaccine Recombinant Rash    Mercury component and Thierosal in flu vaccine   Levaquin [Levofloxacin] Rash   Sulfa Antibiotics Rash   Prior to Admission medications   Medication Sig Start Date End Date Taking? Authorizing Provider  albuterol (VENTOLIN HFA) 108 (90 Base) MCG/ACT inhaler INHALE 2 PUFFS INTO THE LUNGS EVERY 6 HOURS AS NEEDED FOR SHORTNESS OF BREATH 05/23/22  Yes Shade Flood, MD  cholecalciferol (VITAMIN D) 1000 UNITS tablet Take 1,000 Units by mouth daily.   Yes [provider]  docusate sodium (COLACE) 100 MG capsule Take 400 mg by mouth at bedtime.    Yes [provider]  fluticasone (FLONASE) 50 MCG/ACT nasal spray USE 1-2 SPRAYS INTO EACH NOSTRIL ONCE A DAY AS NEEDED 06/05/19  Yes Collie Siad A, MD  levothyroxine (SYNTHROID) 75 MCG tablet TAKE 1 TABLET  BY MOUTH EVERY DAY 05/23/22  Yes Shade Flood, MD  LORazepam (ATIVAN) 0.5 MG tablet TAKE 1 TABLET (0.5 MG TOTAL) BY MOUTH EVERY 8 (EIGHT) HOURS. 04/23/23  Yes Mozingo, Thereasa Solo, NP  sertraline (ZOLOFT) 100 MG tablet Take two tablets daily. 11/09/22  Yes Mozingo, Thereasa Solo, NP  temazepam (RESTORIL) 30 MG capsule TAKE 1 CAPSULE BY MOUTH EVERY DAY AT BEDTIME AS NEEDED 01/24/23  Yes Shade Flood, MD  vitamin B-12 (CYANOCOBALAMIN) 1000 MCG tablet Take 1,000 mcg by mouth every other day.    Yes [provider]  loratadine (CLARITIN) 10 MG tablet Take 10 mg by mouth daily as needed for allergies.     [provider]   Social History   Socioeconomic History   Marital status: Married    Spouse name: Not on file   Number of children: 2   Years of education: Not on file   Highest education level: Not on file  Occupational History   Not on file  Tobacco Use   Smoking status: Never   Smokeless tobacco: Never  Vaping Use   Vaping status: Never Used  Substance and Sexual Activity   Alcohol use: Yes    Alcohol/week: 2.0 standard drinks of alcohol    Types: 2 Glasses of wine per week   Drug use: No   Sexual activity: Not Currently    Partners: Male    Birth control/protection: Post-menopausal  Other Topics Concern   Not on file  Social History Narrative   Work or School: nanny for multiple families      Home Situation: son a Education officer, environmental      Spiritual Beliefs: Christian      Lifestyle: no regular exercise, diet not great   Social Drivers of Corporate investment banker Strain: Not on file  Food Insecurity: Not on file  Transportation Needs: Not on file  Physical Activity: Not on file  Stress: Not on file  Social Connections: Not on file  Intimate Partner Violence: Not on file    Review of Systems Per HPI  Objective:   Vitals:   06/06/23  1136  BP: 130/80  Pulse: 87  Temp: 98.4 F (36.9 C)  TempSrc: Temporal  SpO2: 97%  Weight: 159 lb (72.1  kg)  Height: 5\' 3"  (1.6 m)     Physical Exam Vitals reviewed.  Constitutional:      Appearance: Normal appearance. She is well-developed.  HENT:     Head: Normocephalic and atraumatic.  Eyes:     Conjunctiva/sclera: Conjunctivae normal.     Pupils: Pupils are equal, round, and reactive to light.  Neck:     Vascular: No carotid bruit.  Cardiovascular:     Rate and Rhythm: Normal rate and regular rhythm.     Heart sounds: Normal heart sounds.  Pulmonary:     Effort: Pulmonary effort is normal.     Breath sounds: Normal breath sounds.  Abdominal:     Palpations: Abdomen is soft. There is no pulsatile mass.     Tenderness: There is no abdominal tenderness.  Musculoskeletal:     Right lower leg: No edema.     Left lower leg: No edema.  Skin:    General: Skin is warm and dry.  Neurological:     Mental Status: She is alert and oriented to person, place, and time.     GCS: GCS eye subscore is 4. GCS verbal subscore is 5. GCS motor subscore is 6.     Cranial Nerves: No dysarthria or facial asymmetry.     Sensory: No sensory deficit.     Motor: No weakness.     Coordination: Romberg sign negative. Coordination normal.     Gait: Gait normal.  Psychiatric:        Mood and Affect: Mood normal.        Behavior: Behavior normal.    57 minutes spent during visit, including chart review, counseling and assimilation of information, review of prior labs, most recent tests, repeat exam, discussion of plan including neurology, PMR, and MRI referrals, and chart completion.   Assessment & Plan:  Christine Valdez is a 73 y.o. female . New persistent daily headache - Plan: MR Brain W Wo Contrast, Ambulatory referral to Neurology, predniSONE (DELTASONE) 20 MG tablet  Fatigue, unspecified type - Plan: CMV abs, IgG+IgM (cytomegalovirus), Epstein-Barr virus VCA antibody panel, Ambulatory referral to Physical Medicine Rehab  COVID-19 virus infection - Plan: Ambulatory referral to Physical  Medicine Rehab  Elevated LFTs  History of migraine - Plan: MR Brain W Wo Contrast, Ambulatory referral to Neurology, predniSONE (DELTASONE) 20 MG tablet   Persistent fatigue, headaches since COVID infection in December.  Question long COVID versus flare of migraines, less likely status migrainous.  Unlikely meningitis as neck is supple on exam, no photo/phonophobia in office.  Nonfocal neurologic exam and reassuring CT head with anatomic variant as noted above.  Increased LFTs likely transaminitis possibly from previous viral infection has improving, reassuring ultrasound.  -With persistent fatigue will check EBV, CMV titers although may be nonspecific on acute versus reactivation if elevated.  -Trial of prednisone taper for possible migraine, headache disorder.  Potential side effects discussed.  -Refer to neurology, headache specialist for headaches.  Will check MRI given these persistent headaches with severe headaches at times, overall reassuring CT as above.  -Refer to physical medicine and rehab specialist to discuss fatigue and possible long COVID.  -RTC precautions given.  Meds ordered this encounter  Medications   predniSONE (DELTASONE) 20 MG tablet    Sig: 3 by mouth for 3 days, then 2 by mouth for 2  days, then 1 by mouth for 2 days, then 1/2 by mouth for 2 days.    Dispense:  16 tablet    Refill:  0   Patient Instructions  Thanks for coming today.  I will order an MRI, refer you to headache specialist and physical medicine and rehab in case this could be post-COVID syndrome.  I did check a couple other blood tests.  Prednisone taper for now for possible migraine component.  Let me know how things are going and recheck in 2 weeks.  Return to the clinic or go to the nearest emergency room if any of your symptoms worsen or new symptoms occur.     Signed,   Meredith Staggers, MD [facility], [facility] 06/06/23 9:51 PM

## 2023-06-06 NOTE — Patient Instructions (Addendum)
 Thanks for coming in today.  I will order an MRI, refer you to a headache specialist and physical medicine and rehab in case this could be post-COVID syndrome.  I did check a couple other blood tests and we will order an MRI of the brain.  Prednisone taper for now for possible migraine component.  Let me know how things are going and recheck in 2 weeks.  Return to the clinic or go to the nearest emergency room if any of your symptoms worsen or new symptoms occur.

## 2023-06-08 LAB — CMV ABS, IGG+IGM (CYTOMEGALOVIRUS)
CMV IgM: <30 [AU]/ml
Cytomegalovirus Ab-IgG: <0.6 U/mL

## 2023-06-08 LAB — EPSTEIN-BARR VIRUS VCA ANTIBODY PANEL
EBV NA IgG: >600 U/mL — ABNORMAL HIGH
EBV VCA IgG: >750 U/mL — ABNORMAL HIGH
EBV VCA IgM: <36 U/mL

## 2023-06-11 ENCOUNTER — Encounter: Payer: Self-pay | Admitting: Family Medicine

## 2023-06-12 ENCOUNTER — Encounter: Payer: Self-pay | Admitting: Family Medicine

## 2023-06-12 NOTE — Telephone Encounter (Signed)
 Result note sent this morning.

## 2023-06-20 ENCOUNTER — Ambulatory Visit: Admitting: Physical Therapy

## 2023-07-04 ENCOUNTER — Encounter: Payer: Medicare HMO | Admitting: Family Medicine

## 2023-07-11 ENCOUNTER — Other Ambulatory Visit: Payer: Self-pay | Admitting: Family Medicine

## 2023-07-11 ENCOUNTER — Other Ambulatory Visit: Payer: Self-pay | Admitting: Adult Health

## 2023-07-11 DIAGNOSIS — E039 Hypothyroidism, unspecified: Secondary | ICD-10-CM

## 2023-07-11 DIAGNOSIS — F411 Generalized anxiety disorder: Secondary | ICD-10-CM

## 2023-07-17 ENCOUNTER — Ambulatory Visit
Admission: RE | Admit: 2023-07-17 | Discharge: 2023-07-17 | Disposition: A | Discharge status: Home / Self Care | Source: Ambulatory Visit | Attending: Family Medicine | Admitting: Family Medicine

## 2023-07-17 ENCOUNTER — Encounter: Payer: Self-pay | Admitting: Family Medicine

## 2023-07-17 DIAGNOSIS — I6782 Cerebral ischemia: Secondary | ICD-10-CM | POA: Diagnosis not present

## 2023-07-17 DIAGNOSIS — G4452 New daily persistent headache (NDPH): Secondary | ICD-10-CM

## 2023-07-17 DIAGNOSIS — R519 Headache, unspecified: Secondary | ICD-10-CM | POA: Diagnosis not present

## 2023-07-17 DIAGNOSIS — Z8669 Personal history of other diseases of the nervous system and sense organs: Secondary | ICD-10-CM

## 2023-07-17 MED ORDER — GADOPICLENOL 0.5 MMOL/ML IV SOLN
7.0000 mL | Freq: Once | INTRAVENOUS | Status: AC | PRN
  Administered 2023-07-17: 7 mL via INTRAVENOUS

## 2023-07-23 ENCOUNTER — Other Ambulatory Visit: Payer: Self-pay

## 2023-07-23 ENCOUNTER — Telehealth: Payer: Self-pay | Admitting: Adult Health

## 2023-07-23 ENCOUNTER — Telehealth: Payer: Self-pay | Admitting: Nurse Practitioner

## 2023-07-23 DIAGNOSIS — F411 Generalized anxiety disorder: Secondary | ICD-10-CM

## 2023-07-23 MED ORDER — LORAZEPAM 0.5 MG PO TABS: 0.5000 mg | ORAL_TABLET | Freq: Three times a day (TID) | ORAL | 0 refills | Status: DC

## 2023-07-23 NOTE — Telephone Encounter (Signed)
 Christine Valdez called at 2:15 to request refill of her Lorazepam.  Appt 4/29.  Send to   [id number] - [place], [place] - [address]

## 2023-07-23 NOTE — Telephone Encounter (Signed)
Pended enough to appt.  

## 2023-07-23 NOTE — Telephone Encounter (Signed)
 Copied from CRM [id number]. Topic: Appointments - Appointment Info/Confirmation >> Jul 23, 2023  1:44 PM Caliyah H wrote: Patient/patient representative is calling for information regarding an appointment.Patient is transferring care from Dr. Ester Helms to Kandace Organ, NP. She had a refill for Temazepam from Dr. Ester Helms, but when she went to the pharmacy, they stated they hadn't received anything.

## 2023-07-24 ENCOUNTER — Encounter: Payer: Medicare HMO | Admitting: Nurse Practitioner

## 2023-07-24 ENCOUNTER — Other Ambulatory Visit: Payer: Self-pay

## 2023-07-24 DIAGNOSIS — Z8669 Personal history of other diseases of the nervous system and sense organs: Secondary | ICD-10-CM

## 2023-07-24 DIAGNOSIS — G4452 New daily persistent headache (NDPH): Secondary | ICD-10-CM

## 2023-07-24 NOTE — Telephone Encounter (Addendum)
 I called and spoke with patient and she is requesting a Rx refill of Temazepam after her previous PCP sent it in but  I called [facility] to verify if patient picked up RX and spoke with pharmacist and they did not receive Rx of Temazepam from Dr. Ester Helms.  Requesting: Temazepam  Last Visit: 05/16/2023 Next Visit: 09/20/2023 Last Refill: 07/11/23 by Dr. Caro Christmas  Please Advise

## 2023-07-24 NOTE — Telephone Encounter (Signed)
 Patient called and had to r/s her appt today because she is having car issues. She asked for me to send another message to make [facility] aware & to see if she could still refill her medication. Please advise.

## 2023-07-24 NOTE — Telephone Encounter (Signed)
 Copied from CRM [id number]. Topic: Clinical - Medication Question >> Jul 24, 2023  3:41 PM Caliyah H wrote: Patient called stating she is currently in the process of transferring care to a provider closer to home due to ongoing car trouble. She is currently under the care of Dr. Ester Helms and is requesting a refill for Levothyroxine and Temazepam to last until her upcoming appointment with her new provider in June. Callback number [phone]

## 2023-07-24 NOTE — Telephone Encounter (Signed)
 I called and spoke with Christine Valdez and told her that in order for medication to be filled she has to be established here. She understands and I recommend to her that she calls her previous PCP office to get a refill until she can be seen here.

## 2023-07-26 DIAGNOSIS — M5136 Other intervertebral disc degeneration, lumbar region with discogenic back pain only: Secondary | ICD-10-CM | POA: Diagnosis not present

## 2023-07-26 DIAGNOSIS — M51369 Other intervertebral disc degeneration, lumbar region without mention of lumbar back pain or lower extremity pain: Secondary | ICD-10-CM | POA: Diagnosis not present

## 2023-08-01 ENCOUNTER — Telehealth: Payer: Self-pay

## 2023-08-01 NOTE — Telephone Encounter (Signed)
 Copied from CRM [id number]. Topic: Appointments - Scheduling Inquiry for Clinic >> Aug 01, 2023  2:53 PM [place] J wrote: Reason for CRM: Patient is wondering if there's any room to be "squeezed" into Nche, Charolette's schedule for transfer of care. She would like a call back today with whether or not that is possible.

## 2023-08-01 NOTE — Telephone Encounter (Signed)
 Called Pt and left brief VM and MyChart message sent.

## 2023-08-07 ENCOUNTER — Telehealth: Admitting: Adult Health

## 2023-08-07 ENCOUNTER — Encounter: Payer: Self-pay | Admitting: Adult Health

## 2023-08-07 DIAGNOSIS — F422 Mixed obsessional thoughts and acts: Secondary | ICD-10-CM | POA: Diagnosis not present

## 2023-08-07 DIAGNOSIS — F411 Generalized anxiety disorder: Secondary | ICD-10-CM

## 2023-08-07 DIAGNOSIS — F330 Major depressive disorder, recurrent, mild: Secondary | ICD-10-CM | POA: Diagnosis not present

## 2023-08-07 DIAGNOSIS — G4700 Insomnia, unspecified: Secondary | ICD-10-CM | POA: Diagnosis not present

## 2023-08-07 MED ORDER — LORAZEPAM 0.5 MG PO TABS: 0.5000 mg | ORAL_TABLET | Freq: Every day | ORAL | 0 refills | Status: DC | PRN

## 2023-08-07 MED ORDER — TEMAZEPAM 30 MG PO CAPS: ORAL_CAPSULE | ORAL | 0 refills | Status: DC

## 2023-08-07 NOTE — Progress Notes (Signed)
 Christine Valdez [id number] Mar 12, 1951 73 y.o.  Virtual Visit via Video Note  I connected with pt @ on 08/07/23 at  2:30 PM EDT by a video enabled telemedicine application and verified that I am speaking with the correct person using two identifiers.   I discussed the limitations of evaluation and management by telemedicine and the availability of in person appointments. The patient expressed understanding and agreed to proceed.  I discussed the assessment and treatment plan with the patient. The patient was provided an opportunity to ask questions and all were answered. The patient agreed with the plan and demonstrated an understanding of the instructions.   The patient was advised to call back or seek an in-person evaluation if the symptoms worsen or if the condition fails to improve as anticipated.  I provided 25 minutes of non-face-to-face time during this encounter.  The patient was located at home.  The provider was located at [facility].   Christine Camera, NP   Subjective:   Patient ID:  Christine Valdez is a 73 y.o. (DOB [birth year]) female.  Chief Complaint: No chief complaint on file.   HPI Christine Valdez presents for follow-up of MDD, GAD, and mixed obsessional thoughts and acts.  Describes mood today as "ok". Pleasant. Denies tearfulness. Mood symptoms - denies depression, anxiety and irritability. Reports stable interest and motivation.Denies panic attacks. Reports worry, rumination, and over thinking. Reports obsessive thoughts and acts. Reports picking behaviors. Reports situational stressors - brother. Reports mood as stable. Stating "I feel like I'm doing alright". Taking medications as prescribed.  Energy levels stable. Active, has a regular exercise routine. Enjoys some usual interests and activities. Married. Lives with husband and 3 dogs. Has two children.  Spending time with family. Appetite adequate. Weight gain - 156 pounds. Sleeps well most  nights. Averages 6 to 7 hours. Focus and concentration stable. Completing tasks. Managing aspects of household. Retired from Theme park manager. Denies SI or HI.  Denies AH or VH. Denies self harm. Denies substance use.  Reports osteoarthritis - hip replacement - 2 shoulder replacements.   Previous medication trials: Denies.  Review of Systems:  Review of Systems  Musculoskeletal:  Negative for gait problem.  Neurological:  Negative for tremors.  Psychiatric/Behavioral:         Please refer to HPI    Medications: I have reviewed the patient's current medications.  Current Outpatient Medications  Medication Sig Dispense Refill   albuterol  (VENTOLIN  HFA) 108 (90 Base) MCG/ACT inhaler INHALE 2 PUFFS INTO THE LUNGS EVERY 6 HOURS AS NEEDED FOR SHORTNESS OF BREATH 8.5 each 0   cholecalciferol  (VITAMIN D ) 1000 UNITS tablet Take 1,000 Units by mouth daily.     docusate sodium  (COLACE) 100 MG capsule Take 400 mg by mouth at bedtime.      fluticasone  (FLONASE ) 50 MCG/ACT nasal spray USE 1-2 SPRAYS INTO EACH NOSTRIL ONCE A DAY AS NEEDED 48 mL 1   levothyroxine  (SYNTHROID ) 75 MCG tablet TAKE 1 TABLET BY MOUTH EVERY DAY 90 tablet 3   loratadine  (CLARITIN ) 10 MG tablet Take 10 mg by mouth daily as needed for allergies.      LORazepam  (ATIVAN ) 0.5 MG tablet Take 1 tablet (0.5 mg total) by mouth every 8 (eight) hours. 7 tablet 0   predniSONE  (DELTASONE ) 20 MG tablet 3 by mouth for 3 days, then 2 by mouth for 2 days, then 1 by mouth for 2 days, then 1/2 by mouth for 2 days. 16 tablet 0  sertraline  (ZOLOFT ) 100 MG tablet Take two tablets daily. 180 tablet 3   temazepam  (RESTORIL ) 30 MG capsule TAKE 1 CAPSULE BY MOUTH EVERY DAY AT BEDTIME AS NEEDED 30 capsule 0   vitamin B-12 (CYANOCOBALAMIN ) 1000 MCG tablet Take 1,000 mcg by mouth every other day.      No current facility-administered medications for this visit.    Medication Side Effects: None  Allergies:  Allergies  Allergen Reactions    Diflucan  [Fluconazole ] Hives    unknown   Mercury Hives   Thimerosal (Thiomersal) Hives   Haemophilus B Polysaccharide Vaccine Rash    Mercury component in flu vaccine, Mercury component in flu vaccine, Mercury component and Thierosal in flu vaccine  Mercury component in flu vaccine, Mercury component in flu vaccine, Mercury component and Thierosal in flu vaccine    Mercury component in flu vaccine Mercury component in flu vaccine Mercury component and Thierosal in flu vaccine   Sulfur Rash and Other (See Comments)   Mercury Detox [Nutritional Supplements] Hives   Influenza Vaccine Recombinant Rash    Mercury component and Thierosal in flu vaccine   Levaquin [Levofloxacin] Rash   Sulfa Antibiotics Rash    Past Medical History:  Diagnosis Date   Allergy    uses Flonase  daily as needed and takes Loratadine  daily    Anemia    only after birth of 2nd child   Arthritis    Asthma    uses Albuterol  daily as needed;exercise induced   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, right cheek   Depression    takes Celexa  daily   Depression    Phreesia 07/01/2020   GERD (gastroesophageal reflux disease)    occ   History of colon polyps    History of shingles    5-6 yrs   Hyperlipidemia    Hypothyroid    Insomnia    takes Restoril  nightly as needed   Interstitial cystitis    Osteoarthritis    Sjogren's syndrome (HCC)    Spinal headache    after 1st C/S, o2sat low after shoulder surgery after went to room(51%),hard to urinate)   Thyroid  disease    Phreesia 09/21/2019    Family History  Problem Relation Age of Onset   Hypertension Mother    Osteoporosis Mother    Diabetes Father    Hypertension Father    Breast cancer Sister 59       stage 4, chemo and radiation   Esophageal cancer Sister        breast cancer met to esophagus   Diabetes Brother    Lung cancer Paternal Aunt    Lung cancer Paternal Aunt    Brain cancer Paternal Uncle    Epilepsy Daughter     Social History    Socioeconomic History   Marital status: Married    Spouse name: Not on file   Number of children: 2   Years of education: Not on file   Highest education level: Not on file  Occupational History   Not on file  Tobacco Use   Smoking status: Never   Smokeless tobacco: Never  Vaping Use   Vaping status: Never Used  Substance and Sexual Activity   Alcohol use: Yes    Alcohol/week: 2.0 standard drinks of alcohol    Types: 2 Glasses of wine per week   Drug use: No   Sexual activity: Not Currently    Partners: Male    Birth control/protection: Post-menopausal  Other Topics Concern  Not on file  Social History Narrative   Work or School: nanny for multiple families      Home Situation: son a Education officer, environmental      Spiritual Beliefs: Christian      Lifestyle: no regular exercise, diet not great   Social Drivers of Corporate investment banker Strain: Not on file  Food Insecurity: Not on file  Transportation Needs: Not on file  Physical Activity: Not on file  Stress: Not on file  Social Connections: Not on file  Intimate Partner Violence: Not on file    Past Medical History, Surgical history, Social history, and Family history were reviewed and updated as appropriate.   Please see review of systems for further details on the patient's review from today.   Objective:   Physical Exam:  LMP 04/11/2003 (Approximate)   Physical Exam Constitutional:      General: She is not in acute distress. Musculoskeletal:        General: No deformity.  Neurological:     Mental Status: She is alert and oriented to person, place, and time.     Coordination: Coordination normal.  Psychiatric:        Attention and Perception: Attention and perception normal. She does not perceive auditory or visual hallucinations.        Mood and Affect: Mood normal. Mood is not anxious or depressed. Affect is not labile, blunt, angry or inappropriate.        Speech: Speech normal.        Behavior: Behavior  normal.        Thought Content: Thought content normal. Thought content is not paranoid or delusional. Thought content does not include homicidal or suicidal ideation. Thought content does not include homicidal or suicidal plan.        Cognition and Memory: Cognition and memory normal.        Judgment: Judgment normal.     Comments: Insight intact     Lab Review:     Component Value Date/Time   NA 139 06/04/2023 1216   NA 136 06/25/2020 0912   K 4.1 06/04/2023 1216   CL 102 06/04/2023 1216   CO2 27 06/04/2023 1216   GLUCOSE 102 (H) 06/04/2023 1216   BUN 17 06/04/2023 1216   BUN 20 06/25/2020 0912   CREATININE 0.84 06/04/2023 1216   CALCIUM 10.0 06/04/2023 1216   PROT 7.1 06/04/2023 1216   PROT 6.3 06/25/2020 0912   ALBUMIN 4.5 06/04/2023 1216   ALBUMIN 4.2 06/25/2020 0912   AST 61 (H) 06/04/2023 1216   ALT 60 (H) 06/04/2023 1216   ALKPHOS 46 06/04/2023 1216   BILITOT 0.6 06/04/2023 1216   BILITOT <0.2 06/25/2020 0912   GFRNONAA >60 05/14/2023 1730   GFRAA 72 11/13/2018 1101       Component Value Date/Time   WBC 5.2 05/14/2023 1730   RBC 4.70 05/14/2023 1730   HGB 13.9 05/14/2023 1730   HCT 41.9 05/14/2023 1730   PLT 155 05/14/2023 1730   MCV 89.1 05/14/2023 1730   MCV 87.6 04/30/2017 1237   MCH 29.6 05/14/2023 1730   MCHC 33.2 05/14/2023 1730   RDW 12.9 05/14/2023 1730   LYMPHSABS 1.5 05/14/2023 1730   MONOABS 0.5 05/14/2023 1730   EOSABS 0.2 05/14/2023 1730   BASOSABS 0.1 05/14/2023 1730    No results found for: "POCLITH", "LITHIUM"   No results found for: "PHENYTOIN", "PHENOBARB", "VALPROATE", "CBMZ"   .res Assessment: Plan:    Treatment Plan/Recommendations:  Plan:  PDMP reviewed  Zoloft  100mg  daily  Lorazepam  0.5mg  daily as needed - takes occasionally.  Add Temazepam  30mg  at hs - takes occasionally - PCP prescribing previously.   NAC tabs  RTC 6 months  25 minutes spent dedicated to the care of this patient on the date of this encounter  to include pre-visit review of records, ordering of medication, post visit documentation, and face-to-face time with the patient discussing depression, anxiety, insomnia and obsessive thoughts. Discussed continuing current medication regimen.  Discussed potential benefits, risk, and side effects of benzodiazepines to include potential risk of tolerance and dependence, as well as possible drowsiness.  Advised patient not to drive if experiencing drowsiness and to take lowest possible effective dose to minimize risk of dependence and tolerance.   Patient advised to contact office with any questions, adverse effects, or acute worsening in signs and symptoms.  There are no diagnoses linked to this encounter.   Please see After Visit Summary for patient specific instructions.  Future Appointments  Date Time Provider Department Center  08/07/2023  2:30 PM Wynton Hufstetler, Ursula Gardner, NP [facility] None  10/03/2023  1:00 PM Glory Larsen, MD [facility] None  10/18/2023 10:20 AM Nche, Connye Delaine, NP [facility]  12/31/2023  2:15 PM Lillian Rein, MD [facility]    No orders of the defined types were placed in this encounter.     -------------------------------

## 2023-08-09 ENCOUNTER — Telehealth: Payer: Self-pay

## 2023-08-09 NOTE — Telephone Encounter (Signed)
 Prior Authorization submitted and approved for Temazepam  capsule effective 04/11/23-04/09/24 with [facility].

## 2023-08-23 ENCOUNTER — Ambulatory Visit (INDEPENDENT_AMBULATORY_CARE_PROVIDER_SITE_OTHER): Admitting: Internal Medicine

## 2023-08-23 ENCOUNTER — Encounter: Payer: Self-pay | Admitting: Internal Medicine

## 2023-08-23 VITALS — BP 130/80 | HR 75 | Temp 97.7°F | Ht 63.0 in | Wt 159.2 lb

## 2023-08-23 DIAGNOSIS — N390 Urinary tract infection, site not specified: Secondary | ICD-10-CM

## 2023-08-23 LAB — POC URINALSYSI DIPSTICK (AUTOMATED)
Bilirubin, UA: NEGATIVE
Blood, UA: NEGATIVE
Glucose, UA: NEGATIVE
Ketones, UA: NEGATIVE
Leukocytes, UA: NEGATIVE
Nitrite, UA: NEGATIVE
Protein, UA: POSITIVE — AB
Spec Grav, UA: 1.025 (ref 1.010–1.025)
Urobilinogen, UA: 0.2 U/dL
pH, UA: 6 (ref 5.0–8.0)

## 2023-08-23 MED ORDER — NITROFURANTOIN MONOHYD MACRO 100 MG PO CAPS: 100.0000 mg | ORAL_CAPSULE | Freq: Two times a day (BID) | ORAL | 0 refills | Status: AC

## 2023-08-23 NOTE — Progress Notes (Signed)
 [facility] [address] [place] [address] Dept: [phone] Dept Fax: [phone]  Acute Care Office Visit  Subjective:   Christine Valdez 1950/05/10 08/23/2023  Chief Complaint  Patient presents with   Urinary Tract Infection   Abdominal Pain    HPI:  URINARY SYMPTOMS Onset: 1 week ago  , worsening yesterday   Fever/chills: no Dysuria: no Urinary frequency: yes Urgency: yes Foul odor: no Urinary incontinence: no Hematuria: no Suprapubic pain/pressure: yes Flank/low back pain: yes - chronic back pain, but nothing out of normal for osteoarthritis Nausea/Vomiting: no  Treatments tried: drinking water, avoid acidic foods/drinks  Previous urinary tract infection: yes - in December 2024 after having COVID.    The following portions of the patient's history were reviewed and updated as appropriate: past medical history, past surgical history, family history, social history, allergies, medications, and problem list.   Patient Active Problem List   Diagnosis Date Noted   Other fatigue 05/16/2023   Elevated LFTs 05/16/2023   Sore throat and laryngitis 05/16/2023   Genetic testing 04/11/2022   Family history of breast cancer 03/23/2022   Recurrent major depressive disorder, in partial remission (HCC) 09/28/2016   Hypothyroidism 09/28/2016   B12 deficiency 09/28/2016   Hyperglycemia 09/28/2016   Pruritus ani 06/25/2015   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   Anxiety 02/04/2015   Major depression, recurrent (HCC) 02/04/2015   Mild intermittent asthma without complication 04/21/2014   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/04/2013   Past Medical History:  Diagnosis Date   Allergy    uses Flonase  daily as needed and takes Loratadine  daily    Anemia    only after birth of 2nd child   Arthritis    Asthma    uses Albuterol  daily as needed;exercise induced   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, right cheek    Depression    takes Celexa  daily   Depression    Phreesia 07/01/2020   GERD (gastroesophageal reflux disease)    occ   History of colon polyps    History of shingles    5-6 yrs   Hyperlipidemia    Hypothyroid    Insomnia    takes Restoril  nightly as needed   Interstitial cystitis    Osteoarthritis    Sjogren's syndrome (HCC)    Spinal headache    after 1st C/S, o2sat low after shoulder surgery after went to room(51%),hard to urinate)   Thyroid  disease    Phreesia 09/21/2019   Past Surgical History:  Procedure Laterality Date   CARPAL TUNNEL RELEASE Right 05/2007   CESAREAN SECTION  1981/1984   x 2   COLONOSCOPY     JOINT REPLACEMENT N/A    Phreesia 09/21/2019   TONSILLECTOMY AND ADENOIDECTOMY  age 65   TOTAL SHOULDER ARTHROPLASTY Right 07/04/2013   DR NORRIS   TOTAL SHOULDER ARTHROPLASTY Right 07/04/2013   Procedure: RIGHT TOTAL SHOULDER ARTHROPLASTY;  Surgeon: Lorriane Rote, MD;  Location: [facility] OR;  Service: Orthopedics;  Laterality: Right;   TOTAL SHOULDER ARTHROPLASTY Left 03/12/2015   Procedure: LEFT SHOULDER TOTAL SHOULDER ARTHROPLASTY;  Surgeon: Winston Hawking, MD;  Location: [facility] OR;  Service: Orthopedics;  Laterality: Left;   Family History  Problem Relation Age of Onset   Hypertension Mother    Osteoporosis Mother    Diabetes Father    Hypertension Father    Breast cancer Sister 2       stage 4, chemo and radiation   Esophageal cancer Sister  breast cancer met to esophagus   Diabetes Brother    Lung cancer Paternal Aunt    Lung cancer Paternal Aunt    Brain cancer Paternal Uncle    Epilepsy Daughter     Current Outpatient Medications:    albuterol  (VENTOLIN  HFA) 108 (90 Base) MCG/ACT inhaler, INHALE 2 PUFFS INTO THE LUNGS EVERY 6 HOURS AS NEEDED FOR SHORTNESS OF BREATH, Disp: 8.5 each, Rfl: 0   cholecalciferol  (VITAMIN D ) 1000 UNITS tablet, Take 1,000 Units by mouth daily., Disp: , Rfl:    docusate sodium  (COLACE) 100 MG capsule, Take 400 mg by mouth  at bedtime. , Disp: , Rfl:    fluticasone  (FLONASE ) 50 MCG/ACT nasal spray, USE 1-2 SPRAYS INTO EACH NOSTRIL ONCE A DAY AS NEEDED, Disp: 48 mL, Rfl: 1   levothyroxine  (SYNTHROID ) 75 MCG tablet, TAKE 1 TABLET BY MOUTH EVERY DAY, Disp: 90 tablet, Rfl: 3   loratadine  (CLARITIN ) 10 MG tablet, Take 10 mg by mouth daily as needed for allergies. , Disp: , Rfl:    LORazepam  (ATIVAN ) 0.5 MG tablet, Take 1 tablet (0.5 mg total) by mouth daily as needed for anxiety., Disp: 30 tablet, Rfl: 0   nitrofurantoin , macrocrystal-monohydrate, (MACROBID ) 100 MG capsule, Take 1 capsule (100 mg total) by mouth 2 (two) times daily for 5 days., Disp: 10 capsule, Rfl: 0   sertraline  (ZOLOFT ) 100 MG tablet, Take two tablets daily., Disp: 180 tablet, Rfl: 3   temazepam  (RESTORIL ) 30 MG capsule, TAKE 1 CAPSULE BY MOUTH EVERY DAY AT BEDTIME AS NEEDED, Disp: 30 capsule, Rfl: 0   vitamin B-12 (CYANOCOBALAMIN ) 1000 MCG tablet, Take 1,000 mcg by mouth every other day. , Disp: , Rfl:    predniSONE  (DELTASONE ) 20 MG tablet, 3 by mouth for 3 days, then 2 by mouth for 2 days, then 1 by mouth for 2 days, then 1/2 by mouth for 2 days. (Patient not taking: Reported on 08/23/2023), Disp: 16 tablet, Rfl: 0 Allergies  Allergen Reactions   Diflucan  [Fluconazole ] Hives    unknown   Mercury Hives   Thimerosal (Thiomersal) Hives   Haemophilus B Polysaccharide Vaccine Rash    Mercury component in flu vaccine, Mercury component in flu vaccine, Mercury component and Thierosal in flu vaccine  Mercury component in flu vaccine, Mercury component in flu vaccine, Mercury component and Thierosal in flu vaccine    Mercury component in flu vaccine Mercury component in flu vaccine Mercury component and Thierosal in flu vaccine   Sulfur Rash and Other (See Comments)   Mercury Detox [Nutritional Supplements] Hives   Influenza Vaccine Recombinant Rash    Mercury component and Thierosal in flu vaccine   Levaquin [Levofloxacin] Rash   Sulfa Antibiotics  Rash     ROS: A complete ROS was performed with pertinent positives/negatives noted in the HPI. The remainder of the ROS are negative.    Objective:   Today's Vitals   08/23/23 0901  BP: 130/80  Pulse: 75  Temp: 97.7 F (36.5 C)  TempSrc: Temporal  SpO2: 98%  Weight: 159 lb 3.2 oz (72.2 kg)  Height: 5\' 3"  (1.6 m)    GENERAL: Well-appearing, in NAD. Well nourished.  SKIN: Pink, warm and dry. No rash, lesion, ulceration, or ecchymoses.  NECK: Trachea midline. Full ROM w/o pain or tenderness. No lymphadenopathy.  RESPIRATORY: Chest wall symmetrical. Respirations even and non-labored. Breath sounds clear to auscultation bilaterally.  CARDIAC: S1, S2 present, regular rate and rhythm. Peripheral pulses 2+ bilaterally.  GI: Abdomen soft, suprapubic tenderness. Normoactive  bowel sounds. No rebound tenderness. No CVA tenderness.  EXTREMITIES: Without clubbing, cyanosis, or edema.  NEUROLOGIC: Steady, even gait.  PSYCH/MENTAL STATUS: Alert, oriented x 3. Cooperative, appropriate mood and affect.    Results for orders placed or performed in visit on 08/23/23  POCT Urinalysis Dipstick (Automated)  Result Value Ref Range   Color, UA yellow    Clarity, UA clear    Glucose, UA Negative Negative   Bilirubin, UA neg    Ketones, UA neg    Spec Grav, UA 1.025 1.010 - 1.025   Blood, UA neg    pH, UA 6.0 5.0 - 8.0   Protein, UA Positive (A) Negative   Urobilinogen, UA 0.2 0.2 or 1.0 E.U./dL   Nitrite, UA neg    Leukocytes, UA Negative Negative      Assessment & Plan:  1. Acute UTI (Primary) - POCT Urinalysis Dipstick (Automated) - Urine Culture - nitrofurantoin , macrocrystal-monohydrate, (MACROBID ) 100 MG capsule; Take 1 capsule (100 mg total) by mouth 2 (two) times daily for 5 days.  Dispense: 10 capsule; Refill: 0 - drink plenty of fluids, tylenol  as needed for pain.   Meds ordered this encounter  Medications   nitrofurantoin , macrocrystal-monohydrate, (MACROBID ) 100 MG capsule     Sig: Take 1 capsule (100 mg total) by mouth 2 (two) times daily for 5 days.    Dispense:  10 capsule    Refill:  0    Supervising Provider:   Catheryn Cluck [[id number]]   Orders Placed This Encounter  Procedures   Urine Culture   POCT Urinalysis Dipstick (Automated)   Lab Orders         Urine Culture         POCT Urinalysis Dipstick (Automated)     No images are attached to the encounter or orders placed in the encounter.  Return if symptoms worsen or fail to improve.   Gavin Kast, FNP

## 2023-08-24 LAB — URINE CULTURE
MICRO NUMBER:: 16460510
SPECIMEN QUALITY:: ADEQUATE

## 2023-08-27 ENCOUNTER — Ambulatory Visit: Payer: Self-pay | Admitting: Internal Medicine

## 2023-09-13 ENCOUNTER — Telehealth: Payer: Self-pay | Admitting: Neurology

## 2023-09-13 NOTE — Telephone Encounter (Signed)
 request to cancel appointment, no longer needed

## 2023-09-20 ENCOUNTER — Encounter: Admitting: Nurse Practitioner

## 2023-10-03 ENCOUNTER — Ambulatory Visit: Admitting: Neurology

## 2023-10-14 ENCOUNTER — Encounter (HOSPITAL_BASED_OUTPATIENT_CLINIC_OR_DEPARTMENT_OTHER): Payer: Self-pay | Admitting: Obstetrics & Gynecology

## 2023-10-18 ENCOUNTER — Encounter: Payer: Self-pay | Admitting: Nurse Practitioner

## 2023-10-18 ENCOUNTER — Ambulatory Visit: Admitting: Nurse Practitioner

## 2023-10-18 VITALS — BP 130/72 | HR 73 | Temp 97.7°F | Ht 63.5 in | Wt 159.8 lb

## 2023-10-18 DIAGNOSIS — R7989 Other specified abnormal findings of blood chemistry: Secondary | ICD-10-CM | POA: Diagnosis not present

## 2023-10-18 DIAGNOSIS — E039 Hypothyroidism, unspecified: Secondary | ICD-10-CM

## 2023-10-18 DIAGNOSIS — F419 Anxiety disorder, unspecified: Secondary | ICD-10-CM

## 2023-10-18 DIAGNOSIS — M8580 Other specified disorders of bone density and structure, unspecified site: Secondary | ICD-10-CM

## 2023-10-18 DIAGNOSIS — R42 Dizziness and giddiness: Secondary | ICD-10-CM

## 2023-10-18 DIAGNOSIS — Z780 Asymptomatic menopausal state: Secondary | ICD-10-CM

## 2023-10-18 DIAGNOSIS — J309 Allergic rhinitis, unspecified: Secondary | ICD-10-CM

## 2023-10-18 DIAGNOSIS — R5383 Other fatigue: Secondary | ICD-10-CM

## 2023-10-18 LAB — HEPATIC FUNCTION PANEL
ALT: 55 U/L — ABNORMAL HIGH (ref 0–35)
AST: 70 U/L — ABNORMAL HIGH (ref 0–37)
Albumin: 4.4 g/dL (ref 3.5–5.2)
Alkaline Phosphatase: 50 U/L (ref 39–117)
Bilirubin, Direct: 0.1 mg/dL (ref 0.0–0.3)
Total Bilirubin: 0.6 mg/dL (ref 0.2–1.2)
Total Protein: 7 g/dL (ref 6.0–8.3)

## 2023-10-18 LAB — TSH: TSH: 1.69 u[IU]/mL (ref 0.35–5.50)

## 2023-10-18 LAB — T4, FREE: Free T4: 0.71 ng/dL (ref 0.60–1.60)

## 2023-10-18 MED ORDER — FLUTICASONE PROPIONATE 50 MCG/ACT NA SUSP: NASAL | 1 refills | Status: AC

## 2023-10-18 NOTE — Assessment & Plan Note (Signed)
 Chronic, waxing and waning, associated with mild LFT elevation, intermittent postural dizziness and headache No syncope, no tinnitus, no nause, no tobacco or nicotine use Hx of allergic reaction to pfizer vaccine x 2doses-palpitation. MRI brain 07/2023: No evidence of an acute intracranial abnormality. Mild chronic small vessel ischemic changes within the cerebral white matter and pons.  ABDOMEN US  07/2023: normal gallbladder and ducts. Mild diffuse fatty liver changes.  Provided printed vertigo exercises, encourage to maintain daily exercise. F/up in 34month

## 2023-10-18 NOTE — Assessment & Plan Note (Addendum)
 Chronic, stable, onset post COVID infection No nausea or ABDOMEN pain or weight loss No ALCOHOL or tobacco or tylenol  use Negative acute Hepatitis screen, epstein-barr IgM, CMV ABDOMEN US : normal gallbladder, mild fatty liver  Repeat hepatic panel

## 2023-10-18 NOTE — Progress Notes (Signed)
 Established Patient Visit  Patient: Christine Valdez   DOB: [birth year]   73 y.o. Female  MRN: [id number] Visit Date: 10/18/2023  Subjective:    Chief Complaint  Patient presents with   Transfer of Care     Transferring PCP- vertigo headache, liver enzymes    HPI Transfer from Dr. Levora.  Other fatigue Chronic, waxing and waning, associated with mild LFT elevation, intermittent postural dizziness and headache No syncope, no tinnitus, no nause, no tobacco or nicotine use Hx of allergic reaction to pfizer vaccine x 2doses-palpitation. MRI brain 07/2023: No evidence of an acute intracranial abnormality. Mild chronic small vessel ischemic changes within the cerebral white matter and pons.  ABDOMEN US  07/2023: normal gallbladder and ducts. Mild diffuse fatty liver changes.  Provided printed vertigo exercises, encourage to maintain daily exercise. F/up in 50month  Elevated LFTs Chronic, stable, onset post COVID infection No nausea or ABDOMEN pain or weight loss No ALCOHOL or tobacco or tylenol  use Negative acute Hepatitis screen, epstein-barr IgM, CMV ABDOMEN US : normal gallbladder, mild fatty liver  Repeat hepatic panel  Hypothyroidism Stable, repeat THYROID  and T4 Maintain med dose  Anxiety Current use of restoril  and lorazepam  prn Prescribed by [facility]  Reviewed medical, surgical, and social history today  Medications: Outpatient Medications Prior to Visit  Medication Sig   albuterol  (VENTOLIN  HFA) 108 (90 Base) MCG/ACT inhaler INHALE 2 PUFFS INTO THE LUNGS EVERY 6 HOURS AS NEEDED FOR SHORTNESS OF BREATH (Patient taking differently: 1-2 puffs every 6 (six) hours as needed for shortness of breath or wheezing. INHALE 2 PUFFS INTO THE LUNGS EVERY 6 HOURS AS NEEDED FOR SHORTNESS OF BREATH)   cholecalciferol  (VITAMIN D ) 1000 UNITS tablet Take 1,000 Units by mouth daily.   docusate sodium  (COLACE) 100 MG capsule Take 400 mg by mouth at bedtime.     ibuprofen (ADVIL) 200 MG tablet Take 200 mg by mouth every 6 (six) hours as needed for mild pain (pain score 1-3) or moderate pain (pain score 4-6).   levothyroxine  (SYNTHROID ) 75 MCG tablet TAKE 1 TABLET BY MOUTH EVERY DAY   loratadine  (CLARITIN ) 10 MG tablet Take 10 mg by mouth daily as needed for allergies.    LORazepam  (ATIVAN ) 0.5 MG tablet Take 1 tablet (0.5 mg total) by mouth daily as needed for anxiety.   sertraline  (ZOLOFT ) 100 MG tablet Take two tablets daily. (Patient taking differently: Take 125 mg by mouth daily. Take 1.5 tablets by mouth daily)   temazepam  (RESTORIL ) 30 MG capsule TAKE 1 CAPSULE BY MOUTH EVERY DAY AT BEDTIME AS NEEDED (Patient taking differently: Take 30 mg by mouth at bedtime as needed for sleep. TAKE 1 CAPSULE BY MOUTH EVERY DAY AT BEDTIME AS NEEDED)   Tetrahydrozoline HCl ([employer] EYE DROPS OP) Apply to eye.   vitamin B-12 (CYANOCOBALAMIN ) 1000 MCG tablet Take 1,000 mcg by mouth every other day.    [DISCONTINUED] fluticasone  (FLONASE ) 50 MCG/ACT nasal spray USE 1-2 SPRAYS INTO EACH NOSTRIL ONCE A DAY AS NEEDED (Patient taking differently: Place 2 sprays into both nostrils as needed for allergies or rhinitis. USE 1-2 SPRAYS INTO EACH NOSTRIL ONCE A DAY AS NEEDED)   [DISCONTINUED] predniSONE  (DELTASONE ) 20 MG tablet 3 by mouth for 3 days, then 2 by mouth for 2 days, then 1 by mouth for 2 days, then 1/2 by mouth for 2 days. (Patient not taking: Reported on 10/18/2023)   No facility-administered medications prior  to visit.   Reviewed past medical and social history.   ROS per HPI above      Objective:  BP 130/72 (BP Location: Left Arm, Patient Position: Sitting, Cuff Size: Normal)   Pulse 73   Temp 97.7 F (36.5 C) (Oral)   Ht 5' 3.5 (1.613 m)   Wt 159 lb 12.8 oz (72.5 kg)   LMP 04/11/2003 (Approximate)   SpO2 95%   BMI 27.86 kg/m      Physical Exam Neck:     Thyroid : No thyroid  mass, thyromegaly or thyroid  tenderness.  Cardiovascular:     Rate and  Rhythm: Normal rate and regular rhythm.     Pulses: Normal pulses.     Heart sounds: Normal heart sounds.  Pulmonary:     Effort: Pulmonary effort is normal.     Breath sounds: Normal breath sounds.  Musculoskeletal:     Cervical back: Normal range of motion and neck supple.     Right lower leg: No edema.     Left lower leg: No edema.  Lymphadenopathy:     Cervical: No cervical adenopathy.  Neurological:     Mental Status: She is alert and oriented to person, place, and time.     Results for orders placed or performed in visit on 10/18/23  Hepatic function panel  Result Value Ref Range   Total Bilirubin 0.6 0.2 - 1.2 mg/dL   Bilirubin, Direct 0.1 0.0 - 0.3 mg/dL   Alkaline Phosphatase 50 39 - 117 U/L   AST 70 (H) 0 - 37 U/L   ALT 55 (H) 0 - 35 U/L   Total Protein 7.0 6.0 - 8.3 g/dL   Albumin 4.4 3.5 - 5.2 g/dL  TSH  Result Value Ref Range   TSH 1.69 0.35 - 5.50 uIU/mL  T4, free  Result Value Ref Range   Free T4 0.71 0.60 - 1.60 ng/dL      Assessment & Plan:    Problem List Items Addressed This Visit     Anxiety   Current use of restoril  and lorazepam  prn Prescribed by [facility]      Elevated LFTs   Chronic, stable, onset post COVID infection No nausea or ABDOMEN pain or weight loss No ALCOHOL or tobacco or tylenol  use Negative acute Hepatitis screen, epstein-barr IgM, CMV ABDOMEN US : normal gallbladder, mild fatty liver  Repeat hepatic panel      Relevant Orders   Hepatic function panel (Completed)   Hypothyroidism   Stable, repeat THYROID  and T4 Maintain med dose      Relevant Orders   TSH (Completed)   T4, free (Completed)   Other fatigue - Primary   Chronic, waxing and waning, associated with mild LFT elevation, intermittent postural dizziness and headache No syncope, no tinnitus, no nause, no tobacco or nicotine use Hx of allergic reaction to pfizer vaccine x 2doses-palpitation. MRI brain 07/2023: No evidence of an acute intracranial  abnormality. Mild chronic small vessel ischemic changes within the cerebral white matter and pons.  ABDOMEN US  07/2023: normal gallbladder and ducts. Mild diffuse fatty liver changes.  Provided printed vertigo exercises, encourage to maintain daily exercise. F/up in 57month      Other Visit Diagnoses       Vertigo         Osteopenia after menopause       Relevant Orders   DG Bone Density     Allergic rhinitis, unspecified seasonality, unspecified trigger       Relevant Medications  fluticasone  (FLONASE ) 50 MCG/ACT nasal spray      Return in about 4 weeks (around 11/15/2023) for vertigo.     Roselie Mood, NP

## 2023-10-18 NOTE — Patient Instructions (Addendum)
 Go to lab Start vertigo exercises  Vertigo Vertigo is the feeling that you or the things around you are moving or spinning when they're not. It's different than feeling dizzy. It can also cause: Loss of balance. Trouble standing or walking. Nausea and vomiting. This feeling can come and go at any time. It can last from a few seconds to minutes or even hours. It may go away on its own or be treated with medicine. What are the types of vertigo? There are two types of vertigo: Peripheral vertigo happens when parts of your inner ear don't work like they should. This is the more common type. Central vertigo happens when your brain and spinal cord don't work like they should. Your health care provider will do tests to find out what kind of vertigo you have. This will help them decide on the right treatment for you. Follow these instructions at home: Eating and drinking Drink enough fluid to keep your pee (urine) pale yellow. Do not drink alcohol. Activity When you get up in the morning, first sit up on the side of the bed. When you feel okay, stand slowly while holding onto something. Move slowly. Avoid sudden body or head movements. Avoid certain positions, as told by your provider. Use a cane if you have trouble standing or walking. Sit down right away if you feel unsteady. Place items in your home so they're easy for you to reach without bending or leaning over. Return to normal activities when you're told. Ask what things are safe for you to do. General instructions Take your medicines only as told by your provider. Contact a health care provider if: Your medicines don't help or make your vertigo worse. You get new symptoms. You have a fever. You have nausea or vomiting. Your family or friends spot any changes in how you're acting. A part of your body goes numb. You feel tingling and prickling in a part of your body. You get very bad headaches. Get help right away if: You're  always dizzy or you faint. You have a stiff neck. You have trouble moving or speaking. Your hands, arms, or legs feel weak. Your hearing or eyesight changes. These symptoms may be an emergency. Call 911 right away. Do not wait to see if the symptoms will go away. Do not drive yourself to the hospital. This information is not intended to replace advice given to you by your health care provider. Make sure you discuss any questions you have with your health care provider. Document Revised: 12/28/2022 Document Reviewed: 06/30/2022 Elsevier Patient Education  2024 [employer].

## 2023-10-18 NOTE — Assessment & Plan Note (Signed)
 Current use of restoril  and lorazepam  prn Prescribed by [facility]

## 2023-10-18 NOTE — Assessment & Plan Note (Signed)
 Stable, repeat THYROID  and T4 Maintain med dose

## 2023-10-19 ENCOUNTER — Ambulatory Visit: Payer: Self-pay | Admitting: Nurse Practitioner

## 2023-10-24 ENCOUNTER — Telehealth: Payer: Self-pay | Admitting: Adult Health

## 2023-10-24 NOTE — Telephone Encounter (Signed)
 Next visit is 02/06/24. Christine Valdez called requesting to speak to a nurse. She has long COVID and wants to know if there are any coping mechanisms that could help her. Her phone number is [phone].

## 2023-10-25 NOTE — Telephone Encounter (Signed)
 Pt said she has long COVID and wants some help how to emotionally deal with sx.  Will you please see if Dr. Marijean has any availability and would be willing to see her. She is Christine Valdez's pt.

## 2023-10-25 NOTE — Telephone Encounter (Signed)
 I sent a message to Christine Valdez about seeing her. He is booked out until september

## 2023-10-31 DIAGNOSIS — H02834 Dermatochalasis of left upper eyelid: Secondary | ICD-10-CM | POA: Diagnosis not present

## 2023-10-31 DIAGNOSIS — H1045 Other chronic allergic conjunctivitis: Secondary | ICD-10-CM | POA: Diagnosis not present

## 2023-10-31 DIAGNOSIS — H40013 Open angle with borderline findings, low risk, bilateral: Secondary | ICD-10-CM | POA: Diagnosis not present

## 2023-10-31 DIAGNOSIS — H02831 Dermatochalasis of right upper eyelid: Secondary | ICD-10-CM | POA: Diagnosis not present

## 2023-10-31 DIAGNOSIS — H04123 Dry eye syndrome of bilateral lacrimal glands: Secondary | ICD-10-CM | POA: Diagnosis not present

## 2023-10-31 DIAGNOSIS — H2513 Age-related nuclear cataract, bilateral: Secondary | ICD-10-CM | POA: Diagnosis not present

## 2023-11-01 DIAGNOSIS — R92333 Mammographic heterogeneous density, bilateral breasts: Secondary | ICD-10-CM | POA: Diagnosis not present

## 2023-11-01 DIAGNOSIS — N958 Other specified menopausal and perimenopausal disorders: Secondary | ICD-10-CM | POA: Diagnosis not present

## 2023-11-01 DIAGNOSIS — N644 Mastodynia: Secondary | ICD-10-CM | POA: Diagnosis not present

## 2023-11-01 LAB — HM DEXA SCAN: HM Dexa Scan: NORMAL

## 2023-11-01 LAB — HM MAMMOGRAPHY

## 2023-11-08 ENCOUNTER — Other Ambulatory Visit: Payer: Self-pay | Admitting: Adult Health

## 2023-11-08 DIAGNOSIS — F411 Generalized anxiety disorder: Secondary | ICD-10-CM

## 2023-11-08 DIAGNOSIS — F422 Mixed obsessional thoughts and acts: Secondary | ICD-10-CM

## 2023-11-08 DIAGNOSIS — F330 Major depressive disorder, recurrent, mild: Secondary | ICD-10-CM

## 2023-11-21 ENCOUNTER — Ambulatory Visit: Payer: Self-pay

## 2023-11-21 NOTE — Telephone Encounter (Signed)
 3 attempts- unable to reach pt. Please advise

## 2023-11-21 NOTE — Telephone Encounter (Signed)
 Pt call was disconnected during transfer. This RN attempted call back, LVM. Placing in call backs as no contact was made.  Copied from CRM [id number]. Topic: Clinical - Red Word Triage >> Nov 21, 2023 11:55 AM Ismael A wrote: Red Word that prompted transfer to Nurse Triage: patient states she is experiencing severe pain due to long covid, states she has pain all over her body stated pain level 1-10 is around 8/9 , chest pain on both sides

## 2023-11-21 NOTE — Telephone Encounter (Signed)
 Left message for patient to return call.

## 2023-11-22 ENCOUNTER — Ambulatory Visit: Payer: Self-pay

## 2023-11-22 ENCOUNTER — Ambulatory Visit: Admitting: Professional Counselor

## 2023-11-22 NOTE — Telephone Encounter (Signed)
 FYI Only or Action Required?: Action required by provider: referral request. Requesting referral to Pain Medicine  Patient was last seen in primary care on 10/18/2023 by Nche, Roselie Rockford, NP.  Called Nurse Triage reporting Headache.  Symptoms began yesterday.  Interventions attempted: Rest, hydration, or home remedies and Ice/heat application.  Symptoms are: fluctuating.  Triage Disposition: See Physician Within 24 Hours, See PCP Within 2 Weeks  Patient/caregiver understands and will follow disposition?: Yes Copied from CRM [id number]. Topic: Clinical - Red Word Triage >> Nov 21, 2023 11:55 AM Ismael A wrote: Red Word that prompted transfer to Nurse Triage: patient states she is experiencing severe pain due to long covid, states she has pain all over her body stated pain level 1-10 is around 8/9 , chest pain on both sides >> Nov 22, 2023  1:46 PM [place] E wrote: Patient returning call. Having all over body pain, and chest pain. Reason for Disposition  Headache is a chronic symptom (recurrent or ongoing AND present > 4 weeks)  [1] Chest pain lasts > 5 minutes AND [2] occurred > 3 days ago (72 hours) AND [3] NO chest pain or cardiac symptoms now  Answer Assessment - Initial Assessment Questions 1. LOCATION: Where does it hurt?      Center of head  2. ONSET: When did the headache start? (e.g., minutes, hours, days)      Has been ongoing for several months, but worse since yesterday  3. PATTERN: Does the pain come and go, or has it been constant since it started?     Comes and goes  4. SEVERITY: How bad is the pain? and What does it keep you from doing?  (e.g., Scale 1-10; mild, moderate, or severe)     Moderate to severe  5. RECURRENT SYMPTOM: Have you ever had headaches before? If Yes, ask: When was the last time? and What happened that time?      Yes, has had a full neuro work up   6. CAUSE: What do you think is causing the headache?     Unsure of  actual cause  7. MIGRAINE: Have you been diagnosed with migraine headaches? If Yes, ask: Is this headache similar?      No  8. HEAD INJURY: Has there been any recent injury to your head?      No  9. OTHER SYMPTOMS: Do you have any other symptoms? (e.g., fever, stiff neck, eye pain, sore throat, cold symptoms)     Body aches, dizziness  10. PREGNANCY: Is there any chance you are pregnant? When was your last menstrual period?       no  Answer Assessment - Initial Assessment Questions 1. LOCATION: Where does it hurt?       Right side  2. RADIATION: Does the pain go anywhere else? (e.g., into neck, jaw, arms, back)     No  3. ONSET: When did the chest pain begin? (Minutes, hours or days)      Weeks  4. PATTERN: Does the pain come and go, or has it been constant since it started?  Does it get worse with exertion?      Comes and goes ,worse yesterday  5. DURATION: How long does it last (e.g., seconds, minutes, hours)     Several hours  6. SEVERITY: How bad is the pain?  (e.g., Scale 1-10; mild, moderate, or severe)     Moderate  7. CARDIAC RISK FACTORS: Do you have any history of heart problems or risk  factors for heart disease? (e.g., angina, prior heart attack; diabetes, high blood pressure, high cholesterol, smoker, or strong family history of heart disease)     No  8. PULMONARY RISK FACTORS: Do you have any history of lung disease?  (e.g., blood clots in lung, asthma, emphysema, birth control pills)     No  9. CAUSE: What do you think is causing the chest pain?     Unsure of cause  10. OTHER SYMPTOMS: Do you have any other symptoms? (e.g., dizziness, nausea, vomiting, sweating, fever, difficulty breathing, cough)       Fatigue  11. PREGNANCY: Is there any chance you are pregnant? When was your last menstrual period?       no  Protocols used: Headache-A-AH, Chest Pain-A-AH

## 2023-11-23 ENCOUNTER — Ambulatory Visit: Payer: Self-pay | Admitting: Nurse Practitioner

## 2023-11-23 ENCOUNTER — Ambulatory Visit (INDEPENDENT_AMBULATORY_CARE_PROVIDER_SITE_OTHER): Admitting: Nurse Practitioner

## 2023-11-23 ENCOUNTER — Encounter: Payer: Self-pay | Admitting: Nurse Practitioner

## 2023-11-23 VITALS — BP 128/72 | HR 86 | Temp 98.2°F | Ht 63.5 in | Wt 159.6 lb

## 2023-11-23 DIAGNOSIS — R5381 Other malaise: Secondary | ICD-10-CM

## 2023-11-23 DIAGNOSIS — R5383 Other fatigue: Secondary | ICD-10-CM

## 2023-11-23 DIAGNOSIS — J029 Acute pharyngitis, unspecified: Secondary | ICD-10-CM | POA: Diagnosis not present

## 2023-11-23 LAB — CBC WITH DIFFERENTIAL/PLATELET
Basophils Absolute: 0 K/uL (ref 0.0–0.1)
Basophils Relative: 0.7 % (ref 0.0–3.0)
Eosinophils Absolute: 0.1 K/uL (ref 0.0–0.7)
Eosinophils Relative: 2.4 % (ref 0.0–5.0)
HCT: 40.5 % (ref 36.0–46.0)
Hemoglobin: 13.3 g/dL (ref 12.0–15.0)
Lymphocytes Relative: 23.7 % (ref 12.0–46.0)
Lymphs Abs: 1 K/uL (ref 0.7–4.0)
MCHC: 33 g/dL (ref 30.0–36.0)
MCV: 89.6 fl (ref 78.0–100.0)
Monocytes Absolute: 0.3 K/uL (ref 0.1–1.0)
Monocytes Relative: 7.9 % (ref 3.0–12.0)
Neutro Abs: 2.8 K/uL (ref 1.4–7.7)
Neutrophils Relative %: 65.3 % (ref 43.0–77.0)
Platelets: 143 K/uL — ABNORMAL LOW (ref 150.0–400.0)
RBC: 4.51 Mil/uL (ref 3.87–5.11)
RDW: 13.2 % (ref 11.5–15.5)
WBC: 4.3 K/uL (ref 4.0–10.5)

## 2023-11-23 LAB — BASIC METABOLIC PANEL WITH GFR
BUN: 13 mg/dL (ref 6–23)
CO2: 28 meq/L (ref 19–32)
Calcium: 9.6 mg/dL (ref 8.4–10.5)
Chloride: 102 meq/L (ref 96–112)
Creatinine, Ser: 0.84 mg/dL (ref 0.40–1.20)
GFR: 70.08 mL/min (ref >60.00)
Glucose, Bld: 139 mg/dL — ABNORMAL HIGH (ref 70–99)
Potassium: 4 meq/L (ref 3.5–5.1)
Sodium: 139 meq/L (ref 135–145)

## 2023-11-23 LAB — POC COVID19 BINAXNOW: SARS Coronavirus 2 Ag: NEGATIVE

## 2023-11-23 LAB — POCT URINALYSIS DIPSTICK
Bilirubin, UA: NEGATIVE
Blood, UA: NEGATIVE
Glucose, UA: NEGATIVE
Ketones, UA: NEGATIVE
Leukocytes, UA: NEGATIVE
Nitrite, UA: NEGATIVE
Protein, UA: NEGATIVE
Spec Grav, UA: 1.015 (ref 1.010–1.025)
Urobilinogen, UA: 0.2 U/dL
pH, UA: 7 (ref 5.0–8.0)

## 2023-11-23 LAB — CK: Total CK: 45 U/L (ref 17–177)

## 2023-11-23 LAB — C-REACTIVE PROTEIN: CRP: <1 mg/dL (ref 0.5–20.0)

## 2023-11-23 LAB — SEDIMENTATION RATE: Sed Rate: 2 mm/h (ref 0–30)

## 2023-11-23 LAB — POCT RAPID STREP A (OFFICE): Rapid Strep A Screen: NEGATIVE

## 2023-11-23 MED ORDER — MELOXICAM 7.5 MG PO TABS: 7.5000 mg | ORAL_TABLET | Freq: Every day | ORAL | 0 refills | Status: DC

## 2023-11-23 NOTE — Progress Notes (Unsigned)
 Acute Office Visit  Subjective:    Patient ID: Christine Valdez, female    DOB: 1950/07/07, 73 y.o.   MRN: [id number]  Chief Complaint  Patient presents with   Headache    Headache and body ache since Wednesday    Patient is in today for acute on chronic malaise, fatigue, and headache, worse since Wednesday, associated with sore throat x 1week, no rash, no swollen joints or joint stiffness. No recent travel No improvement with ibuprofen 600mg .  Outpatient Medications Prior to Visit  Medication Sig   albuterol  (VENTOLIN  HFA) 108 (90 Base) MCG/ACT inhaler INHALE 2 PUFFS INTO THE LUNGS EVERY 6 HOURS AS NEEDED FOR SHORTNESS OF BREATH (Patient taking differently: 1-2 puffs every 6 (six) hours as needed for shortness of breath or wheezing. INHALE 2 PUFFS INTO THE LUNGS EVERY 6 HOURS AS NEEDED FOR SHORTNESS OF BREATH)   cholecalciferol  (VITAMIN D ) 1000 UNITS tablet Take 1,000 Units by mouth daily.   docusate sodium  (COLACE) 100 MG capsule Take 400 mg by mouth at bedtime.    fluticasone  (FLONASE ) 50 MCG/ACT nasal spray USE 1-2 SPRAYS INTO EACH NOSTRIL ONCE A DAY AS NEEDED   levothyroxine  (SYNTHROID ) 75 MCG tablet TAKE 1 TABLET BY MOUTH EVERY DAY   loratadine  (CLARITIN ) 10 MG tablet Take 10 mg by mouth daily as needed for allergies.    LORazepam  (ATIVAN ) 0.5 MG tablet Take 1 tablet (0.5 mg total) by mouth daily as needed for anxiety.   sertraline  (ZOLOFT ) 100 MG tablet TAKE 2 TABLETS BY MOUTH EVERY DAY   temazepam  (RESTORIL ) 30 MG capsule TAKE 1 CAPSULE BY MOUTH EVERY DAY AT BEDTIME AS NEEDED (Patient taking differently: Take 30 mg by mouth at bedtime as needed for sleep. TAKE 1 CAPSULE BY MOUTH EVERY DAY AT BEDTIME AS NEEDED)   Tetrahydrozoline HCl ([employer] EYE DROPS OP) Apply to eye.   vitamin B-12 (CYANOCOBALAMIN ) 1000 MCG tablet Take 1,000 mcg by mouth every other day.    [DISCONTINUED] ibuprofen (ADVIL) 200 MG tablet Take 200 mg by mouth every 6 (six) hours as needed for mild pain (pain score  1-3) or moderate pain (pain score 4-6).   No facility-administered medications prior to visit.    Reviewed past medical and social history.  Review of Systems  Constitutional:  Positive for fatigue.  HENT:  Positive for sore throat.   Musculoskeletal:  Positive for myalgias.  Neurological:  Positive for headaches. Negative for dizziness and light-headedness.   Per HPI     Objective:    Physical Exam Vitals and nursing note reviewed.  Cardiovascular:     Rate and Rhythm: Normal rate and regular rhythm.     Heart sounds: Normal heart sounds.  Pulmonary:     Effort: Pulmonary effort is normal.     Breath sounds: Normal breath sounds.  Abdominal:     General: Bowel sounds are normal.     Palpations: Abdomen is soft.  Skin:    Findings: No rash.  Neurological:     Mental Status: She is alert.     Cranial Nerves: No cranial nerve deficit.  Psychiatric:        Mood and Affect: Mood normal.        Speech: Speech normal.        Behavior: Behavior normal.    BP 128/72 (BP Location: Left Arm, Patient Position: Sitting, Cuff Size: Normal)   Pulse 86   Temp 98.2 F (36.8 C) (Oral)   Ht 5' 3.5 (1.613 m)  Wt 159 lb 9.6 oz (72.4 kg)   LMP 04/11/2003 (Approximate)   SpO2 97%   BMI 27.83 kg/m    Results for orders placed or performed in visit on 11/23/23  CBC with Differential/Platelet  Result Value Ref Range   WBC 4.3 4.0 - 10.5 K/uL   RBC 4.51 3.87 - 5.11 Mil/uL   Hemoglobin 13.3 12.0 - 15.0 g/dL   HCT 59.4 63.9 - 53.9 %   MCV 89.6 78.0 - 100.0 fl   MCHC 33.0 30.0 - 36.0 g/dL   RDW 86.7 88.4 - 84.4 %   Platelets 143.0 (L) 150.0 - 400.0 K/uL   Neutrophils Relative % 65.3 43.0 - 77.0 %   Lymphocytes Relative 23.7 12.0 - 46.0 %   Monocytes Relative 7.9 3.0 - 12.0 %   Eosinophils Relative 2.4 0.0 - 5.0 %   Basophils Relative 0.7 0.0 - 3.0 %   Neutro Abs 2.8 1.4 - 7.7 K/uL   Lymphs Abs 1.0 0.7 - 4.0 K/uL   Monocytes Absolute 0.3 0.1 - 1.0 K/uL   Eosinophils Absolute  0.1 0.0 - 0.7 K/uL   Basophils Absolute 0.0 0.0 - 0.1 K/uL  Basic metabolic panel with GFR  Result Value Ref Range   Sodium 139 135 - 145 mEq/L   Potassium 4.0 3.5 - 5.1 mEq/L   Chloride 102 96 - 112 mEq/L   CO2 28 19 - 32 mEq/L   Glucose, Bld 139 (H) 70 - 99 mg/dL   BUN 13 6 - 23 mg/dL   Creatinine, Ser 9.15 0.40 - 1.20 mg/dL   GFR 29.91 >39.99 mL/min   Calcium 9.6 8.4 - 10.5 mg/dL  C-reactive protein  Result Value Ref Range   CRP <1.0 0.5 - 20.0 mg/dL  Sedimentation rate  Result Value Ref Range   Sed Rate 2 0 - 30 mm/hr  CK  Result Value Ref Range   Total CK 45 17 - 177 U/L  POC COVID-19  Result Value Ref Range   SARS Coronavirus 2 Ag Negative Negative  POCT rapid strep A  Result Value Ref Range   Rapid Strep A Screen Negative Negative  POCT urinalysis dipstick  Result Value Ref Range   Color, UA Yellow    Clarity, UA Clear    Glucose, UA Negative Negative   Bilirubin, UA Negative    Ketones, UA Negative    Spec Grav, UA 1.015 1.010 - 1.025   Blood, UA Negative    pH, UA 7.0 5.0 - 8.0   Protein, UA Negative Negative   Urobilinogen, UA 0.2 0.2 or 1.0 E.U./dL   Nitrite, UA Negative    Leukocytes, UA Negative Negative   Appearance Clear    Odor No       Assessment & Plan:   Problem List Items Addressed This Visit   None Visit Diagnoses       Malaise and fatigue    -  Primary   Relevant Medications   meloxicam  (MOBIC ) 7.5 MG tablet   Other Relevant Orders   POC COVID-19 (Completed)   POCT urinalysis dipstick (Completed)   CBC with Differential/Platelet (Completed)   Basic metabolic panel with GFR (Completed)   C-reactive protein (Completed)   Sedimentation rate (Completed)   CK (Completed)     Sore throat       Relevant Medications   meloxicam  (MOBIC ) 7.5 MG tablet   Other Relevant Orders   POCT rapid strep A (Completed)      Meds ordered this encounter  Medications   meloxicam  (MOBIC ) 7.5 MG tablet    Sig: Take 1 tablet (7.5 mg total) by mouth  daily. With food    Dispense:  30 tablet    Refill:  0    Supervising Provider:   BERNETA ELSIE SAYRE [[id number]]   Return in about 4 weeks (around 12/21/2023) for maliase, fatigue and dizziness.  Roselie Mood, NP

## 2023-11-23 NOTE — Patient Instructions (Addendum)
 Go to lab Negative COVID and strep. Use meloxican 7.5mg  daily with food and tylenol  650mg  every 8hrs as needed for pain. Do not take mobic  and zoloft  at same time. Take 4hrs apart

## 2023-11-26 DIAGNOSIS — L821 Other seborrheic keratosis: Secondary | ICD-10-CM | POA: Insufficient documentation

## 2023-11-26 DIAGNOSIS — L814 Other melanin hyperpigmentation: Secondary | ICD-10-CM | POA: Insufficient documentation

## 2023-11-26 DIAGNOSIS — D225 Melanocytic nevi of trunk: Secondary | ICD-10-CM | POA: Insufficient documentation

## 2023-11-28 ENCOUNTER — Ambulatory Visit: Admitting: Nurse Practitioner

## 2023-12-07 ENCOUNTER — Telehealth: Payer: Self-pay

## 2023-12-07 NOTE — Telephone Encounter (Signed)
 Copied from CRM #[id number]. Topic: Clinical - Medical Advice >> Dec 07, 2023  3:19 PM Pinkey ORN wrote: Reason for CRM: Please Advise >> Dec 07, 2023  3:23 PM Pinkey ORN wrote: Patient wants to know since she still has long COVID is it best to still get her flu vaccine? Patient also wants to know if she can be given a referral to the clinic (across from [place]) that patients go to for long COVID.SABRA Please follow up with patient at [phone]

## 2023-12-07 NOTE — Telephone Encounter (Signed)
 Copied from CRM [id number]. Topic: Referral - Request for Referral >> Dec 07, 2023  3:29 PM [place] J wrote: Did the patient discuss referral with their provider in the last year? Yes (If No - schedule appointment) (If Yes - send message)  Appointment offered? No  Type of order/referral and detailed reason for visit: Patient has been dealing with long term COVID symptoms and is wanting to be referred to a clinic that would help her get over her long season of COVID.  Preference of office, provider, location:  [facility], [address] [place], [place], [place]  [address]  If referral order, have you been seen by this specialty before? No (If Yes, this issue or another issue? When? Where?  Can we respond through MyChart? Yes

## 2023-12-11 NOTE — Telephone Encounter (Signed)
 Copied from CRM #[id number]. Topic: Clinical - Medical Advice >> Dec 07, 2023  3:19 PM Pinkey ORN wrote: Reason for CRM: Please Advise >> Dec 07, 2023  3:23 PM Pinkey ORN wrote: Patient wants to know since she still has long COVID is it best to still get her flu vaccine? Patient also wants to know if she can be given a referral to the clinic (across from [place]) that patients go to for long COVID.SABRA Please follow up with patient at [phone]

## 2023-12-12 ENCOUNTER — Encounter: Payer: Self-pay | Admitting: Nurse Practitioner

## 2023-12-12 NOTE — Telephone Encounter (Signed)
 Copied from CRM #[id number]. Topic: General - Other >> Dec 12, 2023  1:18 PM Pinkey ORN wrote: Reason for CRM: Returning Office Call >> Dec 12, 2023  1:25 PM Pinkey ORN wrote: Patient returning a missed office call from Lenon Roughen, CMA .

## 2023-12-12 NOTE — Telephone Encounter (Signed)
 Called and left a voice message for patient and I will also send a MyChart message with provider comments to patient.

## 2023-12-12 NOTE — Telephone Encounter (Signed)
 Called and left the provider comments about the COVID clinic and flu vaccine as a voice message per DPR on file. I also stated that I sent a MyChart message with the comments form Roselie to her about the COVID clinic as well.

## 2023-12-17 NOTE — Telephone Encounter (Signed)
 Called and left a detailed voice message for patient stating the Roselie says that it is okay for her to get the flu vaccine. I stated that she can give our office a call to get scheduled for a nurse visit, go to her local pharmacy or any [facility] pharmacy for the flu vaccine. I stated that I sent her a MyChart message with this information as well.

## 2023-12-20 ENCOUNTER — Other Ambulatory Visit: Payer: Self-pay | Admitting: Nurse Practitioner

## 2023-12-20 DIAGNOSIS — J029 Acute pharyngitis, unspecified: Secondary | ICD-10-CM

## 2023-12-20 DIAGNOSIS — R5381 Other malaise: Secondary | ICD-10-CM

## 2023-12-24 ENCOUNTER — Ambulatory Visit: Admitting: Nurse Practitioner

## 2023-12-25 ENCOUNTER — Other Ambulatory Visit: Payer: Self-pay | Admitting: Adult Health

## 2023-12-25 DIAGNOSIS — G4700 Insomnia, unspecified: Secondary | ICD-10-CM

## 2023-12-31 ENCOUNTER — Encounter (HOSPITAL_BASED_OUTPATIENT_CLINIC_OR_DEPARTMENT_OTHER): Payer: Self-pay | Admitting: Obstetrics & Gynecology

## 2023-12-31 ENCOUNTER — Ambulatory Visit (HOSPITAL_BASED_OUTPATIENT_CLINIC_OR_DEPARTMENT_OTHER): Payer: Medicare HMO | Admitting: Obstetrics & Gynecology

## 2023-12-31 VITALS — BP 145/95 | HR 84 | Ht 63.5 in | Wt 156.6 lb

## 2023-12-31 DIAGNOSIS — R399 Unspecified symptoms and signs involving the genitourinary system: Secondary | ICD-10-CM | POA: Diagnosis not present

## 2023-12-31 DIAGNOSIS — R7309 Other abnormal glucose: Secondary | ICD-10-CM | POA: Diagnosis not present

## 2023-12-31 DIAGNOSIS — R3915 Urgency of urination: Secondary | ICD-10-CM | POA: Diagnosis not present

## 2023-12-31 DIAGNOSIS — R42 Dizziness and giddiness: Secondary | ICD-10-CM | POA: Diagnosis not present

## 2023-12-31 DIAGNOSIS — N952 Postmenopausal atrophic vaginitis: Secondary | ICD-10-CM | POA: Diagnosis not present

## 2023-12-31 DIAGNOSIS — Z01419 Encounter for gynecological examination (general) (routine) without abnormal findings: Secondary | ICD-10-CM | POA: Diagnosis not present

## 2023-12-31 LAB — POCT URINALYSIS DIP (CLINITEK)
Bilirubin, UA: NEGATIVE
Blood, UA: NEGATIVE
Glucose, UA: NEGATIVE mg/dL
Ketones, POC UA: NEGATIVE mg/dL
Leukocytes, UA: NEGATIVE
Nitrite, UA: NEGATIVE
POC PROTEIN,UA: NEGATIVE
Spec Grav, UA: 1.02 (ref 1.010–1.025)
Urobilinogen, UA: 0.2 U/dL
pH, UA: 5.5 (ref 5.0–8.0)

## 2023-12-31 LAB — HEMOGLOBIN A1C
Est. average glucose Bld gHb Est-mCnc: 123 mg/dL
Hgb A1c MFr Bld: 5.9 % — ABNORMAL HIGH (ref 4.8–5.6)

## 2023-12-31 MED ORDER — ESTRADIOL 0.1 MG/GM VA CREA
TOPICAL_CREAM | VAGINAL | 3 refills | Status: AC
Start: 2023-12-31 — End: ?

## 2023-12-31 NOTE — Progress Notes (Unsigned)
 Breast and Pelvic Exam Patient name: Christine Valdez MRN [id number]  Date of birth: Mar 05, 1951 Chief Complaint:   Breast and Pelvic Exam  History of Present Illness:   Christine Valdez is a 73 y.o. [id number] Caucasian female being seen today for breast and pelvic exam.  Had Covid in December when she went to [place] .  Thinks she maybe got it on the airplane.  Was really severe case of Covid.  Cough and SOB resolved but just doesn't feel good.  Having dizziness and did MRI of brain.  Had elevated liver enzymes.    Today, she thinks she may have a UTI as she is having symptoms include urinary frequency and pressure.  Denies vaginal bleeding.  Denies hematuria.  POCT today is negative.   Seeing therapist at [facility].  Is on Sertraline .  Doesn't feel that's really helping.  Was on Citralopram and this helped more.  She does have follow up scheduled.     Patient's last menstrual period was 04/11/2003 (approximate).    Last pap 12/19/2021. Results were: NILM w/ HRHPV not done. H/O abnormal pap: no Last mammogram: 11/01/2023. Results were: normal. Family h/o breast cancer: yes , sister.  Last colonoscopy:617/2024.  Results were: normal. Family h/o colorectal cancer: no Dexa:   T score -1.0     12/31/2023    2:12 PM 10/18/2023   10:30 AM 06/06/2023   11:53 AM 01/19/2022    3:29 PM 12/19/2021    2:32 PM  Depression screen PHQ 2/9  Decreased Interest 1 1 3  0 0  Down, Depressed, Hopeless 1 1 3  0 0  PHQ - 2 Score 2 2 6  0 0  Altered sleeping  1 3 0   Tired, decreased energy  1 3 0   Change in appetite  0 2 0   Feeling bad or failure about yourself   0 0 0   Trouble concentrating  0 1 0   Moving slowly or fidgety/restless  0 0 0   Suicidal thoughts  0 3 0   PHQ-9 Score  4 18 0   Difficult doing work/chores  Not difficult at all Somewhat difficult          10/18/2023   10:31 AM 06/06/2023   11:53 AM  GAD 7 : Generalized Anxiety Score  Nervous, Anxious, on Edge 0 1  Control/stop  worrying 1 1  Worry too much - different things 1 1  Trouble relaxing 0 0  Restless 0 0  Easily annoyed or irritable 0 0  Afraid - awful might happen 0 0  Total GAD 7 Score 2 3  Anxiety Difficulty Not difficult at all      Review of Systems:   Pertinent items are noted in HPI Denies any pelvic pain or bowel changes Pertinent History Reviewed:  Reviewed past medical,surgical, social and family history.  Reviewed problem list, medications and allergies. Physical Assessment:   Vitals:   12/31/23 1352 12/31/23 1411  BP: (!) 149/86 (!) 145/95  Pulse: 96 84  SpO2: 98%   Weight: 156 lb 9.6 oz (71 kg)   Height: 5' 3.5 (1.613 m)   Body mass index is 27.31 kg/m.        Physical Examination:   General appearance - well appearing, and in no distress  Mental status - alert, oriented to person, place, and time  Psych:  She has a normal mood and affect  Skin - warm and dry, normal color, no suspicious lesions noted  Chest - effort normal, all lung fields clear to auscultation bilaterally  Heart - normal rate and regular rhythm  Neck:  midline trachea, no thyromegaly or nodules  Breasts - breasts appear normal, no suspicious masses, no skin or nipple changes or  axillary nodes  Abdomen - soft, nontender, nondistended, no masses or organomegaly  Pelvic - VULVA: normal appearing vulva with no masses, tenderness or lesions   VAGINA: normal appearing vagina with normal color and discharge, no lesions   CERVIX: normal appearing cervix without discharge or lesions, no CMT  Thin prep pap is   UTERUS: uterus is felt to be normal size, shape, consistency and nontender   ADNEXA: No adnexal masses or tenderness noted.  Rectal - normal rectal, good sphincter tone, no masses felt.   Extremities:  No swelling or varicosities noted  Chaperone present for exam  Results for orders placed or performed in visit on 12/31/23 (from the past 24 hours)  POCT URINALYSIS DIP (CLINITEK)   Collection Time:  12/31/23  2:19 PM  Result Value Ref Range   Color, UA yellow yellow   Clarity, UA clear clear   Glucose, UA negative negative mg/dL   Bilirubin, UA negative negative   Ketones, POC UA negative negative mg/dL   Spec Grav, UA 8.979 8.989 - 1.025   Blood, UA negative negative   pH, UA 5.5 5.0 - 8.0   POC PROTEIN,UA negative negative, trace   Urobilinogen, UA 0.2 0.2 or 1.0 E.U./dL   Nitrite, UA Negative Negative   Leukocytes, UA Negative Negative    Assessment & Plan:    Orders Placed This Encounter  Procedures   POCT URINALYSIS DIP (CLINITEK)    Meds: No orders of the defined types were placed in this encounter.   Follow-up: No follow-ups on file.  Ronal GORMAN Pinal, MD 12/31/2023 2:55 PM

## 2024-01-02 LAB — URINE CULTURE

## 2024-01-03 ENCOUNTER — Telehealth (HOSPITAL_BASED_OUTPATIENT_CLINIC_OR_DEPARTMENT_OTHER): Payer: Self-pay

## 2024-01-03 ENCOUNTER — Ambulatory Visit (HOSPITAL_BASED_OUTPATIENT_CLINIC_OR_DEPARTMENT_OTHER): Payer: Self-pay | Admitting: Obstetrics & Gynecology

## 2024-01-03 NOTE — Telephone Encounter (Signed)
 Christine Valdez is returning your call.

## 2024-01-03 NOTE — Telephone Encounter (Signed)
 Please see result note dated 01/03/24 on 12/31/23 results.

## 2024-01-09 ENCOUNTER — Encounter (HOSPITAL_BASED_OUTPATIENT_CLINIC_OR_DEPARTMENT_OTHER): Payer: Self-pay | Admitting: Obstetrics & Gynecology

## 2024-01-15 ENCOUNTER — Other Ambulatory Visit (HOSPITAL_BASED_OUTPATIENT_CLINIC_OR_DEPARTMENT_OTHER): Payer: Self-pay | Admitting: Obstetrics & Gynecology

## 2024-01-15 DIAGNOSIS — R7989 Other specified abnormal findings of blood chemistry: Secondary | ICD-10-CM

## 2024-01-17 DIAGNOSIS — R7989 Other specified abnormal findings of blood chemistry: Secondary | ICD-10-CM | POA: Diagnosis not present

## 2024-01-18 ENCOUNTER — Ambulatory Visit (HOSPITAL_BASED_OUTPATIENT_CLINIC_OR_DEPARTMENT_OTHER): Payer: Self-pay | Admitting: Obstetrics & Gynecology

## 2024-01-18 LAB — HEPATIC FUNCTION PANEL
ALT: 36 IU/L — ABNORMAL HIGH (ref 0–32)
AST: 58 IU/L — ABNORMAL HIGH (ref 0–40)
Albumin: 4.3 g/dL (ref 3.8–4.8)
Alkaline Phosphatase: 58 IU/L (ref 49–135)
Bilirubin Total: 0.4 mg/dL (ref 0.0–1.2)
Bilirubin, Direct: 0.13 mg/dL (ref 0.00–0.40)
Total Protein: 6.5 g/dL (ref 6.0–8.5)

## 2024-02-02 ENCOUNTER — Other Ambulatory Visit: Payer: Self-pay | Admitting: Adult Health

## 2024-02-02 DIAGNOSIS — F330 Major depressive disorder, recurrent, mild: Secondary | ICD-10-CM

## 2024-02-02 DIAGNOSIS — F422 Mixed obsessional thoughts and acts: Secondary | ICD-10-CM

## 2024-02-02 DIAGNOSIS — F411 Generalized anxiety disorder: Secondary | ICD-10-CM

## 2024-02-06 ENCOUNTER — Encounter: Payer: Self-pay | Admitting: Adult Health

## 2024-02-06 ENCOUNTER — Telehealth: Admitting: Adult Health

## 2024-02-06 DIAGNOSIS — F329 Major depressive disorder, single episode, unspecified: Secondary | ICD-10-CM | POA: Diagnosis not present

## 2024-02-06 DIAGNOSIS — F330 Major depressive disorder, recurrent, mild: Secondary | ICD-10-CM

## 2024-02-06 DIAGNOSIS — F422 Mixed obsessional thoughts and acts: Secondary | ICD-10-CM

## 2024-02-06 DIAGNOSIS — F411 Generalized anxiety disorder: Secondary | ICD-10-CM | POA: Diagnosis not present

## 2024-02-06 DIAGNOSIS — G4700 Insomnia, unspecified: Secondary | ICD-10-CM | POA: Diagnosis not present

## 2024-02-06 MED ORDER — SERTRALINE HCL 100 MG PO TABS
100.0000 mg | ORAL_TABLET | Freq: Every day | ORAL | 3 refills | Status: AC
Start: 2024-02-06 — End: ?

## 2024-02-06 MED ORDER — TEMAZEPAM 30 MG PO CAPS: ORAL_CAPSULE | ORAL | 0 refills | Status: AC

## 2024-02-06 MED ORDER — LORAZEPAM 0.5 MG PO TABS
0.5000 mg | ORAL_TABLET | Freq: Every day | ORAL | 0 refills | Status: AC | PRN
Start: 2024-02-06 — End: ?

## 2024-02-06 MED ORDER — DULOXETINE HCL 30 MG PO CPEP: 30.0000 mg | ORAL_CAPSULE | Freq: Every day | ORAL | 5 refills | Status: DC

## 2024-02-06 NOTE — Progress Notes (Signed)
 Christine Valdez [id number] 12-17-1950 73 y.o.  Virtual Visit via Video Note  I connected with pt @ on 02/06/24 at  1:30 PM EDT by a video enabled telemedicine application and verified that I am speaking with the correct person using two identifiers.   I discussed the limitations of evaluation and management by telemedicine and the availability of in person appointments. The patient expressed understanding and agreed to proceed.  I discussed the assessment and treatment plan with the patient. The patient was provided an opportunity to ask questions and all were answered. The patient agreed with the plan and demonstrated an understanding of the instructions.   The patient was advised to call back or seek an in-person evaluation if the symptoms worsen or if the condition fails to improve as anticipated.  I provided 25 minutes of non-face-to-face time during this encounter.  The patient was located at home.  The provider was located at [facility].   Angeline LOISE Sayers, NP   Subjective:   Patient ID:  Christine Valdez is a 73 y.o. (DOB 18-Oct-1950) female.  Chief Complaint: No chief complaint on file.   HPI Christine Valdez presents for follow-up of MDD, GAD, and mixed obsessional thoughts and acts.  Describes mood today as ok. Pleasant. Reports tearfulness. Mood symptoms - reports depression and anxiety. Denies irritability. Reports lower interest and motivation - it's pretty bad. Denies panic attacks. Reports worry, rumination, and over thinking. Reports obsessive thoughts. Reports picking behaviors. Reports situational stressors - brother. 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 Reports mood as lower. Stating I feel like I'm struggling. Taking medications as prescribed.  Energy levels lower. Active, has a regular exercise routine. Enjoys some usual interests and activities. Married. Lives with husband and 3 dogs. Has two children. Spending time with  family. Appetite adequate. Weight stable - 156 pounds. Sleeps well most nights. Averages 8 or more hours. Focus and concentration stable - pretty good. Completing tasks. Managing aspects of household. Retired from theme park manager. Denies SI or HI.  Denies AH or VH. Denies self harm. Denies substance use.  Reports osteoarthritis - hip replacement - 2 shoulder replacements.   Previous medication trials: Denies.    Review of Systems:  Review of Systems  Musculoskeletal:  Negative for gait problem.  Neurological:  Negative for tremors.  Psychiatric/Behavioral:         Please refer to HPI    Medications: I have reviewed the patient's current medications.  Current Outpatient Medications  Medication Sig Dispense Refill   albuterol  (VENTOLIN  HFA) 108 (90 Base) MCG/ACT inhaler INHALE 2 PUFFS INTO THE LUNGS EVERY 6 HOURS AS NEEDED FOR SHORTNESS OF BREATH 8.5 each 0   cholecalciferol  (VITAMIN D ) 1000 UNITS tablet Take 1,000 Units by mouth daily.     docusate sodium  (COLACE) 100 MG capsule Take 400 mg by mouth at bedtime.      estradiol  (ESTRACE ) 0.1 MG/GM vaginal cream 1 gram vaginally twice weekly 42.5 g 3   fluticasone  (FLONASE ) 50 MCG/ACT nasal spray USE 1-2 SPRAYS INTO EACH NOSTRIL ONCE A DAY AS NEEDED 48 mL 1   levothyroxine  (SYNTHROID ) 75 MCG tablet TAKE 1 TABLET BY MOUTH EVERY DAY 90 tablet 3   loratadine  (CLARITIN ) 10 MG tablet Take 10 mg by mouth daily as needed for allergies.      LORazepam  (ATIVAN ) 0.5 MG tablet Take 1 tablet (0.5 mg total) by mouth daily as needed for anxiety. 30 tablet 0   meloxicam  (MOBIC ) 7.5 MG tablet Take  1 tablet (7.5 mg total) by mouth daily. With food 30 tablet 0   sertraline  (ZOLOFT ) 100 MG tablet TAKE 2 TABLETS BY MOUTH EVERY DAY 180 tablet 0   temazepam  (RESTORIL ) 30 MG capsule TAKE 1 CAPSULE BY MOUTH EVERY DAY AT BEDTIME AS NEEDED 30 capsule 0   Tetrahydrozoline HCl ([facility] EYE DROPS OP) Apply to eye.     vitamin B-12 (CYANOCOBALAMIN ) 1000 MCG tablet  Take 1,000 mcg by mouth every other day.      No current facility-administered medications for this visit.    Medication Side Effects: None  Allergies:  Allergies  Allergen Reactions   Diflucan  [Fluconazole ] Hives    unknown   Mercury Hives   Thimerosal (Thiomersal) Hives   Haemophilus B Polysaccharide Vaccine Rash    Mercury component in flu vaccine, Mercury component in flu vaccine, Mercury component and Thierosal in flu vaccine  Mercury component in flu vaccine, Mercury component in flu vaccine, Mercury component and Thierosal in flu vaccine    Mercury component in flu vaccine Mercury component in flu vaccine Mercury component and Thierosal in flu vaccine   Sulfur Rash and Other (See Comments)   Mercury Detox [Nutritional Supplements] Hives   Covid-19 (Mrna Bivalent) Vaccine Proofreader) [Covid-19 (Mrna) Vaccine] Palpitations   Influenza Vaccine Recombinant Rash    Mercury component and Thierosal in flu vaccine   Levaquin [Levofloxacin] Rash   Sulfa Antibiotics Rash    Past Medical History:  Diagnosis Date   Allergy    uses Flonase  daily as needed and takes Loratadine  daily    Anemia    only after birth of 2nd child   Arthritis    Asthma    uses Albuterol  daily as needed;exercise induced   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, right cheek   Depression    takes Celexa  daily   Depression    [employer] 07/01/2020   GERD (gastroesophageal reflux disease)    occ   History of colon polyps    History of shingles    5-6 yrs   Hyperlipidemia    Hypothyroid    Insomnia    takes Restoril  nightly as needed   Interstitial cystitis    Multilevel degenerative disc disease    Osteoarthritis    Sjogren's syndrome    Sore throat and laryngitis 05/16/2023   Spinal headache    after 1st C/S, o2sat low after shoulder surgery after went to room(51%),hard to urinate)   Thyroid  disease    [employer] 09/21/2019    Family History  Problem Relation Age of Onset   Hypertension Mother     Osteoporosis Mother    Diabetes Father    Hypertension Father    Breast cancer Sister 8       stage 4, chemo and radiation   Esophageal cancer Sister        breast cancer met to esophagus   Diabetes Brother    Lung cancer Paternal Aunt    Lung cancer Paternal Aunt    Brain cancer Paternal Uncle    Epilepsy Daughter     Social History   Socioeconomic History   Marital status: Married    Spouse name: Not on file   Number of children: 2   Years of education: Not on file   Highest education level: Not on file  Occupational History   Not on file  Tobacco Use   Smoking status: Never   Smokeless tobacco: Never  Vaping Use   Vaping status: Never Used  Substance  and Sexual Activity   Alcohol use: Not Currently   Drug use: No   Sexual activity: Not Currently    Partners: Male    Birth control/protection: Post-menopausal  Other Topics Concern   Not on file  Social History Narrative   Work or School: nanny for multiple families      Home Situation: son a education officer, environmental      Spiritual Beliefs: Christian      Lifestyle: no regular exercise, diet not great   Social Drivers of Corporate Investment Banker Strain: Not on file  Food Insecurity: Not on file  Transportation Needs: Not on file  Physical Activity: Not on file  Stress: Not on file  Social Connections: Not on file  Intimate Partner Violence: Not on file    Past Medical History, Surgical history, Social history, and Family history were reviewed and updated as appropriate.   Please see review of systems for further details on the patient's review from today.   Objective:   Physical Exam:  LMP 04/11/2003 (Approximate)   Physical Exam Constitutional:      General: She is not in acute distress. Musculoskeletal:        General: No deformity.  Neurological:     Mental Status: She is alert and oriented to person, place, and time.     Coordination: Coordination normal.  Psychiatric:        Attention and  Perception: Attention and perception normal. She does not perceive auditory or visual hallucinations.        Mood and Affect: Mood normal. Mood is not anxious or depressed. Affect is not labile, blunt, angry or inappropriate.        Speech: Speech normal.        Behavior: Behavior normal.        Thought Content: Thought content normal. Thought content is not paranoid or delusional. Thought content does not include homicidal or suicidal ideation. Thought content does not include homicidal or suicidal plan.        Cognition and Memory: Cognition and memory normal.        Judgment: Judgment normal.     Comments: Insight intact     Lab Review:     Component Value Date/Time   NA 139 11/23/2023 1436   NA 136 06/25/2020 0912   K 4.0 11/23/2023 1436   CL 102 11/23/2023 1436   CO2 28 11/23/2023 1436   GLUCOSE 139 (H) 11/23/2023 1436   BUN 13 11/23/2023 1436   BUN 20 06/25/2020 0912   CREATININE 0.84 11/23/2023 1436   CALCIUM 9.6 11/23/2023 1436   PROT 6.5 01/17/2024 1314   ALBUMIN 4.3 01/17/2024 1314   AST 58 (H) 01/17/2024 1314   ALT 36 (H) 01/17/2024 1314   ALKPHOS 58 01/17/2024 1314   BILITOT 0.4 01/17/2024 1314   GFRNONAA >60 05/14/2023 1730   GFRAA 72 11/13/2018 1101       Component Value Date/Time   WBC 4.3 11/23/2023 1436   RBC 4.51 11/23/2023 1436   HGB 13.3 11/23/2023 1436   HCT 40.5 11/23/2023 1436   PLT 143.0 (L) 11/23/2023 1436   MCV 89.6 11/23/2023 1436   MCV 87.6 04/30/2017 1237   MCH 29.6 05/14/2023 1730   MCHC 33.0 11/23/2023 1436   RDW 13.2 11/23/2023 1436   LYMPHSABS 1.0 11/23/2023 1436   MONOABS 0.3 11/23/2023 1436   EOSABS 0.1 11/23/2023 1436   BASOSABS 0.0 11/23/2023 1436    No results found for: POCLITH, LITHIUM  No results found for: PHENYTOIN, PHENOBARB, VALPROATE, CBMZ   .res Assessment: Plan:    Plan:  PDMP reviewed  Add Cymblata 30mg  daily  Zoloft  100mg  daily   Lorazepam  0.5mg  daily as needed - takes occasionally -  twice a month. Temazepam  30mg  at hs - takes occasionally - takes 3 times a month  NAC tabs  RTC 6 months  25 minutes spent dedicated to the care of this patient on the date of this encounter to include pre-visit review of records, ordering of medication, post visit documentation, and face-to-face time with the patient discussing depression, anxiety, insomnia and obsessive thoughts. Discussed continuing current medication regimen.  Discussed potential benefits, risk, and side effects of benzodiazepines to include potential risk of tolerance and dependence, as well as possible drowsiness.  Advised patient not to drive if experiencing drowsiness and to take lowest possible effective dose to minimize risk of dependence and tolerance.   Patient advised to contact office with any questions, adverse effects, or acute worsening in signs and symptoms.  There are no diagnoses linked to this encounter.   Please see After Visit Summary for patient specific instructions.  Future Appointments  Date Time Provider Department Center  02/06/2024  1:30 PM Tremell Reimers, Angeline Mattocks, NP [facility] None  02/07/2024  9:00 AM Burnetta [name] DASEN, [facility] None  12/31/2024  3:35 PM Cleotilde Ronal RAMAN, MD [facility] [phone] [facility]    No orders of the defined types were placed in this encounter.     -------------------------------

## 2024-02-07 ENCOUNTER — Encounter: Payer: Self-pay | Admitting: Professional Counselor

## 2024-02-07 ENCOUNTER — Ambulatory Visit (INDEPENDENT_AMBULATORY_CARE_PROVIDER_SITE_OTHER): Admitting: Professional Counselor

## 2024-02-07 DIAGNOSIS — F411 Generalized anxiety disorder: Secondary | ICD-10-CM | POA: Diagnosis not present

## 2024-02-07 DIAGNOSIS — F331 Major depressive disorder, recurrent, moderate: Secondary | ICD-10-CM

## 2024-02-07 NOTE — Progress Notes (Signed)
 [facility] Counsel[facility] Initial Adult Exam  Name: Christine Valdez Date: 02/07/2024 MRN: [id number] DOB: June 16, 1950 PCP: Katheen Roselie Rockford, NP  Time spent: 9:18 AM to 10:33 AM  Guardian/Payee:  pt    Paperwork requested:  No   Reason for Visit /Presenting Problem: anxiety, depression  Mental Status Exam:    Appearance:   Neat     Behavior:  Appropriate, Sharing, and Motivated  Motor:  Normal  Speech/Language:   Clear and Coherent and Normal Rate  Affect:  Appropriate and Congruent  Mood:  normal  Thought process:  normal  Thought content:    WNL  Sensory/Perceptual disturbances:    WNL  Orientation:  oriented to person, place, time/date, and situation  Attention:  Good  Concentration:  Good  Memory:  WNL  Fund of knowledge:   Good  Insight:    Good  Judgment:   Good  Impulse Control:  Good   Reported Symptoms: Anhedonia, low mood, sleep concerns, fatigue, appetite concerns, restlessness, vague SI no intent/plan, worries, trouble relaxing, irritability, catastrophic thinking, mind racing, socially isolating tendencies, health concerns, stress, phase of life concerns  Risk Assessment: Danger to Self:  Yes.  without intent/plan Self-injurious Behavior: No Danger to Others: No Duty to Warn:no Physical Aggression / Violence:No  Access to Firearms a concern: No  Gang Involvement:No  Patient / guardian was educated about steps to take if suicide or homicide risk level increases between visits: yes While future psychiatric events cannot be accurately predicted, the patient does not currently require acute inpatient psychiatric care and does not currently meet [place]  involuntary commitment criteria.  Substance Abuse History: Current substance abuse: No     Past Psychiatric History:   Previous psychological history is significant for anxiety, depression, and OCD Outpatient Providers: one by hx History of Psych Hospitalization: No  Psychological Testing: n/a    Abuse History: Victim of No., n/a   Report needed: No. Victim of Neglect:No. Perpetrator of n/a  Witness / Exposure to Domestic Violence: No   Protective Services Involvement: No  Witness to [employer] Violence:  No   Family History: maternal aunt completed suicide; brother: severe depression; sister (deceased): emotional dysregulation concerns; son: depression Family History  Problem Relation Age of Onset   Hypertension Mother    Osteoporosis Mother    Diabetes Father    Hypertension Father    Breast cancer Sister 75       stage 4, chemo and radiation   Esophageal cancer Sister        breast cancer met to esophagus   Diabetes Brother    Lung cancer Paternal Aunt    Lung cancer Paternal Aunt    Brain cancer Paternal Uncle    Epilepsy Daughter     Living situation: the patient lives with their spouse  Sexual Orientation:  Straight  Relationship Status: married               If a parent, number of children / ages: dtr 72yo; son 72yo, identical twin son deceased in utero  Support Systems; spouse, daughter, son, friends, pets  Surveyor, Quantity Stress:  Yes   Income/Employment/Disability: Employment airline pilot; retirement   Financial Planner: No   Educational History: Education: hospital doctor  Religion/Sprituality/World View:   [employer]  Any cultural differences that may affect / interfere with treatment:  not applicable   Recreation/Hobbies: reading, walking, crafting, pet sitting  Stressors:Financial difficulties   Health problems   Other: cleaning    Strengths:  Supportive  Relationships, Family, Friends, Church, Spirituality, Hopefulness, Journalist, Newspaper, Able to [employer], and resiliency, kindheartedness  Barriers:  n/a   Legal History: Pending legal issue / charges: The patient has no significant history of legal issues. History of legal issue / charges: n/a  Medical History/Surgical History:reviewed: long covid, hip  replacement Past Medical History:  Diagnosis Date   Allergy    uses Flonase  daily as needed and takes Loratadine  daily    Anemia    only after birth of 2nd child   Arthritis    Asthma    uses Albuterol  daily as needed;exercise induced   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, right cheek   Depression    takes Celexa  daily   Depression    Phreesia 07/01/2020   GERD (gastroesophageal reflux disease)    occ   History of colon polyps    History of shingles    5-6 yrs   Hyperlipidemia    Hypothyroid    Insomnia    takes Restoril  nightly as needed   Interstitial cystitis    Multilevel degenerative disc disease    Osteoarthritis    Sjogren's syndrome    Sore throat and laryngitis 05/16/2023   Spinal headache    after 1st C/S, o2sat low after shoulder surgery after went to room(51%),hard to urinate)   Thyroid  disease    Phreesia 09/21/2019    Past Surgical History:  Procedure Laterality Date   CARPAL TUNNEL RELEASE Right 05/2007   CESAREAN SECTION  1981/1984   x 2   COLONOSCOPY     JOINT REPLACEMENT N/A    Phreesia 09/21/2019   TONSILLECTOMY AND ADENOIDECTOMY  age 11   TOTAL SHOULDER ARTHROPLASTY Right 07/04/2013   DR NORRIS   TOTAL SHOULDER ARTHROPLASTY Right 07/04/2013   Procedure: RIGHT TOTAL SHOULDER ARTHROPLASTY;  Surgeon: Elspeth JONELLE Her, MD;  [facility];  Service: Orthopedics;  Laterality: Right;   TOTAL SHOULDER ARTHROPLASTY Left 03/12/2015   Procedure: LEFT SHOULDER TOTAL SHOULDER ARTHROPLASTY;  Surgeon: Marcey Her, MD;  Location: [facility] OR;  Service: Orthopedics;  Laterality: Left;    Medications: Current Outpatient Medications  Medication Sig Dispense Refill   albuterol  (VENTOLIN  HFA) 108 (90 Base) MCG/ACT inhaler INHALE 2 PUFFS INTO THE LUNGS EVERY 6 HOURS AS NEEDED FOR SHORTNESS OF BREATH 8.5 each 0   cholecalciferol  (VITAMIN D ) 1000 UNITS tablet Take 1,000 Units by mouth daily.     docusate sodium  (COLACE) 100 MG capsule Take 400 mg by mouth at bedtime.       DULoxetine  (CYMBALTA ) 30 MG capsule Take 1 capsule (30 mg total) by mouth daily. 30 capsule 5   estradiol  (ESTRACE ) 0.1 MG/GM vaginal cream 1 gram vaginally twice weekly 42.5 g 3   fluticasone  (FLONASE ) 50 MCG/ACT nasal spray USE 1-2 SPRAYS INTO EACH NOSTRIL ONCE A DAY AS NEEDED 48 mL 1   levothyroxine  (SYNTHROID ) 75 MCG tablet TAKE 1 TABLET BY MOUTH EVERY DAY 90 tablet 3   loratadine  (CLARITIN ) 10 MG tablet Take 10 mg by mouth daily as needed for allergies.      LORazepam  (ATIVAN ) 0.5 MG tablet Take 1 tablet (0.5 mg total) by mouth daily as needed for anxiety. 30 tablet 0   meloxicam  (MOBIC ) 7.5 MG tablet Take 1 tablet (7.5 mg total) by mouth daily. With food 30 tablet 0   sertraline  (ZOLOFT ) 100 MG tablet Take 1 tablet (100 mg total) by mouth daily. 90 tablet 3   temazepam  (RESTORIL ) 30 MG capsule TAKE 1 CAPSULE BY MOUTH  EVERY DAY AT BEDTIME AS NEEDED 30 capsule 0   Tetrahydrozoline HCl ([employer] EYE DROPS OP) Apply to eye.     vitamin B-12 (CYANOCOBALAMIN ) 1000 MCG tablet Take 1,000 mcg by mouth every other day.      No current facility-administered medications for this visit.    Allergies  Allergen Reactions   Diflucan  [Fluconazole ] Hives    unknown   Mercury Hives   Thimerosal (Thiomersal) Hives   Haemophilus B Polysaccharide Vaccine Rash    Mercury component in flu vaccine, Mercury component in flu vaccine, Mercury component and Thierosal in flu vaccine  Mercury component in flu vaccine, Mercury component in flu vaccine, Mercury component and Thierosal in flu vaccine    Mercury component in flu vaccine Mercury component in flu vaccine Mercury component and Thierosal in flu vaccine   Sulfur Rash and Other (See Comments)   Mercury Detox [Nutritional Supplements] Hives   Covid-19 (Mrna Bivalent) Vaccine Proofreader) [Covid-19 (Mrna) Vaccine] Palpitations   Influenza Vaccine Recombinant Rash    Mercury component and Thierosal in flu vaccine   Levaquin [Levofloxacin] Rash   Sulfa Antibiotics  Rash    Diagnoses:    ICD-10-CM   1. Major depressive disorder, recurrent episode, moderate (HCC)  F33.1     2. Generalized anxiety disorder  F41.1       Treatment Provided: Counselor provided person-centered counseling including active listening, building of rapport; clinical assessment; facilitation of PHQ-9 score 17, GAD-7 score 10, MDQ 2/13.  Patient did not endorse experience of mania.  Patient shared regarding experience of long COVID and challenging symptomology.  She expressed sense of overwhelming fatigue, frustration, and concern for there being no resolution insight.  Patient shared regarding other health challenges by history.  Counselor and patient discussed functional medicine options as potentially supportive to patient holistic care.  Patient identified desire for support group, and counselor and patient discussed possible online options given limited local resources.  Patient identified experience of financial stress at this time, and shared regarding grief and loss, and noted some potentially OCD-like symptoms including excoriation (fingers).  Counselor and patient discussed patient strengths, support system, interests, and began to discuss patient counseling goals.  Plan of Care: Patient is scheduled for follow-up; continue to build rapport, assess symptoms as with YBOCS assessment, continue to assess history, discuss treatment plan and obtain consent.  [name] ONEIDA Sprang, [facility]

## 2024-02-29 ENCOUNTER — Ambulatory Visit: Admitting: Professional Counselor

## 2024-03-01 ENCOUNTER — Other Ambulatory Visit: Payer: Self-pay | Admitting: Adult Health

## 2024-03-01 DIAGNOSIS — F330 Major depressive disorder, recurrent, mild: Secondary | ICD-10-CM

## 2024-03-01 DIAGNOSIS — F411 Generalized anxiety disorder: Secondary | ICD-10-CM

## 2024-03-04 ENCOUNTER — Telehealth: Payer: Self-pay | Admitting: Adult Health

## 2024-03-04 NOTE — Telephone Encounter (Signed)
 LVM to [facility]

## 2024-03-04 NOTE — Telephone Encounter (Signed)
 Christine Valdez called and said that the cymbalta  she has been taking is making her so sick. She has been so nauseous that she has lost 15 lbs. She would like to know how to wean off of the medicine. Please call at [phone]

## 2024-03-04 NOTE — Telephone Encounter (Signed)
 Pt reports being on Cymbalta  30 mg for a month and has had severe nausea/vomiting. She does not want to continue and wants to come off it, asking for how to do that.   Seen 10/29:  Add Cymblata 30mg  daily   Zoloft  100mg  daily    Lorazepam  0.5mg  daily as needed - takes occasionally - twice a month. Temazepam  30mg  at hs - takes occasionally - takes 3 times a month   NAC tabs  She was previously on it in 2014 from a different provider. Not sure why she stopped it.

## 2024-03-05 NOTE — Telephone Encounter (Signed)
 LVM per DPR with recommendation. Told her to let us  know if she wanted to taper with 20 mg.

## 2024-03-11 DIAGNOSIS — M25561 Pain in right knee: Secondary | ICD-10-CM | POA: Diagnosis not present

## 2024-03-26 ENCOUNTER — Ambulatory Visit: Payer: Self-pay

## 2024-03-26 NOTE — Telephone Encounter (Signed)
 FYI Only or Action Required?: FYI only for provider: appt made for 12/18.  Patient was last seen in primary care on 11/23/2023 by Nche, Roselie Rockford, NP.  Called Nurse Triage reporting Abdominal Pain and Hip Pain.  Symptoms began several weeks ago.  Interventions attempted: OTC medications: ibuprofen.  Symptoms are: gradually worsening.  Triage Disposition: See PCP When Office is Open (Within 3 Days)  Patient/caregiver understands and will follow disposition?:   Copied from CRM #[id number]. Topic: Clinical - Red Word Triage >> Mar 26, 2024 11:29 AM Adelita E wrote: [facility] that prompted transfer to Nurse Triage: Severe pain on right side near hip. Patient stated it is getting worse. Reason for Disposition  [1] MODERATE pain (e.g., interferes with normal activities, limping) AND [2] present > 3 days  Answer Assessment - Initial Assessment Questions 1. LOCATION and RADIATION: Where is the pain located? Does the pain spread (shoot) anywhere else?     Rt hip pain for several weeks worsening this week.  2. QUALITY: What does the pain feel like?  (e.g., sharp, dull, aching, burning)      3. SEVERITY: How bad is the pain? What does it keep you from doing?   (Scale 1-10; or mild, moderate, severe)     Pain constant/  interferes with activity. Not improved with ibuprofen      5. WORK OR EXERCISE: Has there been any recent work or exercise that involved this part of the body?      Denies injury 6. CAUSE: What do you think is causing the hip pain?      unknonw 7. AGGRAVATING FACTORS: What makes the hip pain worse? (e.g., walking, climbing stairs, running)     activity 8. OTHER SYMPTOMS: Do you have any other symptoms? (e.g., back pain, pain shooting down leg,  fever, rash)     Radiates to abd at times, not at this time  Protocols used: Hip Pain-A-AH

## 2024-03-27 ENCOUNTER — Encounter: Payer: Self-pay | Admitting: Family Medicine

## 2024-03-27 ENCOUNTER — Ambulatory Visit: Admitting: Family Medicine

## 2024-03-27 VITALS — BP 129/79 | HR 83 | Temp 97.6°F | Ht 63.5 in | Wt 154.4 lb

## 2024-03-27 DIAGNOSIS — M545 Low back pain, unspecified: Secondary | ICD-10-CM

## 2024-03-27 DIAGNOSIS — R10A1 Flank pain, right side: Secondary | ICD-10-CM | POA: Diagnosis not present

## 2024-03-27 DIAGNOSIS — K5900 Constipation, unspecified: Secondary | ICD-10-CM | POA: Diagnosis not present

## 2024-03-27 LAB — POC URINALSYSI DIPSTICK (AUTOMATED)
Bilirubin, UA: NEGATIVE
Blood, UA: NEGATIVE
Glucose, UA: NEGATIVE
Ketones, UA: NEGATIVE
Leukocytes, UA: NEGATIVE
Nitrite, UA: NEGATIVE
Protein, UA: NEGATIVE
Spec Grav, UA: <=1.005 — AB (ref 1.010–1.025)
Urobilinogen, UA: 0.2 U/dL
pH, UA: 7 (ref 5.0–8.0)

## 2024-03-27 MED ORDER — MELOXICAM 7.5 MG PO TABS: 7.5000 mg | ORAL_TABLET | Freq: Every day | ORAL | 0 refills | Status: AC

## 2024-03-27 MED ORDER — CYCLOBENZAPRINE HCL 5 MG PO TABS: 5.0000 mg | ORAL_TABLET | Freq: Three times a day (TID) | ORAL | 1 refills | Status: AC | PRN

## 2024-03-27 NOTE — Progress Notes (Signed)
 Acute Office Visit  Subjective:  Patient ID: Christine Valdez, female    DOB: Jun 16, 1950  Age: 73 y.o. MRN: [id number]  CC:  Chief Complaint  Patient presents with   side pain    Onset - couple of weeks - pain level at times is an 8 - pain starts on the right side & wraps around to my lower back Always struggled with UTIs    Urinary Frequency    Urinating more frequently      HPI Christine Valdez is here for right low back pain.   ***        Past Medical History:  Diagnosis Date   Allergy    uses Flonase  daily as needed and takes Loratadine  daily    Anemia    only after birth of 2nd child   Arthritis    Asthma    uses Albuterol  daily as needed;exercise induced   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, right cheek   Depression    takes Celexa  daily   Depression    Phreesia 07/01/2020   GERD (gastroesophageal reflux disease)    occ   History of colon polyps    History of shingles    5-6 yrs   Hyperlipidemia    Hypothyroid    Insomnia    takes Restoril  nightly as needed   Interstitial cystitis    Multilevel degenerative disc disease    Osteoarthritis    Sjogren's syndrome    Sore throat and laryngitis 05/16/2023   Spinal headache    after 1st C/S, o2sat low after shoulder surgery after went to room(51%),hard to urinate)   Thyroid  disease    Phreesia 09/21/2019    Past Surgical History:  Procedure Laterality Date   CARPAL TUNNEL RELEASE Right 05/2007   CESAREAN SECTION  1981/1984   x 2   COLONOSCOPY     JOINT REPLACEMENT N/A    Phreesia 09/21/2019   TONSILLECTOMY AND ADENOIDECTOMY  age 31   TOTAL SHOULDER ARTHROPLASTY Right 07/04/2013   DR NORRIS   TOTAL SHOULDER ARTHROPLASTY Right 07/04/2013   Procedure: RIGHT TOTAL SHOULDER ARTHROPLASTY;  Surgeon: Elspeth JONELLE Her, MD;  Location: [facility] OR;  Service: Orthopedics;  Laterality: Right;   TOTAL SHOULDER ARTHROPLASTY Left 03/12/2015   Procedure: LEFT SHOULDER TOTAL SHOULDER ARTHROPLASTY;  Surgeon: Marcey Her, MD;  Location: [facility] OR;  Service: Orthopedics;  Laterality: Left;    Family History  Problem Relation Age of Onset   Hypertension Mother    Osteoporosis Mother    Diabetes Father    Hypertension Father    Breast cancer Sister 53       stage 4, chemo and radiation   Esophageal cancer Sister        breast cancer met to esophagus   Diabetes Brother    Lung cancer Paternal Aunt    Lung cancer Paternal Aunt    Brain cancer Paternal Uncle    Epilepsy Daughter     Social History   Socioeconomic History   Marital status: Married    Spouse name: Not on file   Number of children: 2   Years of education: Not on file   Highest education level: Not on file  Occupational History   Not on file  Tobacco Use   Smoking status: Never   Smokeless tobacco: Never  Vaping Use   Vaping status: Never Used  Substance and Sexual Activity   Alcohol use: Not Currently   Drug use: No  Sexual activity: Not Currently    Partners: Male    Birth control/protection: Post-menopausal  Other Topics Concern   Not on file  Social History Narrative   Work or School: nanny for multiple families      Home Situation: son a education officer, environmental      Spiritual Beliefs: Christian      Lifestyle: no regular exercise, diet not great   Social Drivers of Health   Tobacco Use: Low Risk (03/27/2024)   Patient History    Smoking Tobacco Use: Never    Smokeless Tobacco Use: Never    Passive Exposure: Not on file  Financial Resource Strain: Not on file  Food Insecurity: Not on file  Transportation Needs: Not on file  Physical Activity: Not on file  Stress: Not on file  Social Connections: Not on file  Intimate Partner Violence: Not on file  Depression (PHQ2-9): High Risk (02/07/2024)   Depression (PHQ2-9)    PHQ-2 Score: 17  Alcohol Screen: Low Risk (07/04/2021)   Alcohol Screen    Last Alcohol Screening Score (AUDIT): 0  Housing: Not on file  Utilities: Not on file  Health Literacy: Not on file     ROS All ROS negative except what is listed in the HPI.   Objective:   Today's Vitals: BP 129/79 (BP Location: Left Arm, Patient Position: Sitting, Cuff Size: Normal)   Pulse 83   Temp 97.6 F (36.4 C) (Oral)   Ht 5' 3.5 (1.613 m)   Wt 154 lb 6.4 oz (70 kg)   LMP 04/11/2003   SpO2 96%   BMI 26.92 kg/m   Physical Exam Vitals reviewed.  Constitutional:      Appearance: Normal appearance.  Abdominal:     Tenderness: There is no right CVA tenderness or left CVA tenderness.  Musculoskeletal:     Lumbar back: Spasms and tenderness present. No bony tenderness. Normal range of motion.  Skin:    Findings: No erythema or rash.  Neurological:     Mental Status: She is alert and oriented to person, place, and time.  Psychiatric:        Mood and Affect: Mood normal.        Behavior: Behavior normal.        Thought Content: Thought content normal.        Judgment: Judgment normal.            Assessment & Plan:   Problem List Items Addressed This Visit   None Visit Diagnoses       Acute right-sided low back pain without sciatica    -  Primary   Relevant Medications   meloxicam  (MOBIC ) 7.5 MG tablet   cyclobenzaprine  (FLEXERIL ) 5 MG tablet     Right flank pain       Relevant Orders   POCT Urinalysis Dipstick (Automated) (Completed)      ***  Assessment and Plan Assessment & Plan      Follow-up: Return if symptoms worsen or fail to improve.   Waddell FURY [name], DNP, FNP-C  I,Emily Lagle,acting as a neurosurgeon for [name], NP.,have documented all relevant documentation on the behalf of [name], NP.   I, [name], NP, have reviewed all documentation for this visit. The documentation on 03/27/2024 for the exam, diagnosis, procedures, and orders are all accurate and complete.

## 2024-03-27 NOTE — Patient Instructions (Addendum)
CONSTIPATION:  ALL THE TIME:  Lifestyle measures  Hydration: drink plenty of water  Physical activity: at least walking daily  High-fiber foods Bulk forming laxatives   Psyllium (Konsyl; Metamucil; Perdiem)  Methylcellulose (Citrucel)  Calcium polycarbophil (FiberCon; Fiber-Lax; Mitrolan)  Wheat dextrin (Benefiber)  AS NEEDED FOR 3-5 DAYS AT A TIME OR ALL THE TIME 1-2 TIMES PER WEEK  (CAN TAKE ONE FROM EACH CATEGORY E.G. MIRALAX + SENNA) Hyperosmolar or saline laxatives:  Polyethylene glycol (MiraLAX, GlycoLax)  Lactulose  Sorbitol  Magnesium hydroxide (Milk of Magnesia)   Magnesium citrate (Evac-Q-Mag)  Stimulant laxatives  Senna (eg, Black Draught, Ex-Lax, Fletcher's, Castoria, Senokot)   Bisacodyl (eg, Correctol, Doxidan, Dulcolax). Taking stimulant laxatives regularly or in large amounts can cause side effects, including low potassium levels. Thus, you should take these drugs carefully if you must use them regularly.  PRESCRIPTIONS that treat severe constipation. They are expensive, but may be recommended if you do not respond to other treatments.  Lubiprostone (Amitiza)  Linaclotide (Linzess)  Plecanatide (Trulance, Motegrity)  Tenapanor (Ibsrela)    

## 2024-03-28 ENCOUNTER — Ambulatory Visit: Payer: Self-pay | Admitting: Family Medicine

## 2024-03-28 DIAGNOSIS — M1612 Unilateral primary osteoarthritis, left hip: Secondary | ICD-10-CM | POA: Diagnosis not present

## 2024-03-28 DIAGNOSIS — M7062 Trochanteric bursitis, left hip: Secondary | ICD-10-CM | POA: Diagnosis not present

## 2024-03-28 DIAGNOSIS — Z96641 Presence of right artificial hip joint: Secondary | ICD-10-CM | POA: Diagnosis not present

## 2024-03-28 DIAGNOSIS — M25552 Pain in left hip: Secondary | ICD-10-CM | POA: Diagnosis not present

## 2024-03-28 NOTE — Telephone Encounter (Signed)
 Patient appt was 12/18

## 2024-04-24 ENCOUNTER — Other Ambulatory Visit: Payer: Self-pay | Admitting: Family Medicine

## 2024-04-24 DIAGNOSIS — M545 Low back pain, unspecified: Secondary | ICD-10-CM

## 2024-05-14 ENCOUNTER — Ambulatory Visit: Admitting: Professional Counselor

## 2024-07-21 ENCOUNTER — Ambulatory Visit: Admitting: Adult Health

## 2024-12-31 ENCOUNTER — Ambulatory Visit (HOSPITAL_BASED_OUTPATIENT_CLINIC_OR_DEPARTMENT_OTHER): Admitting: Obstetrics & Gynecology
